# Patient Record
Sex: Female | Born: 1950
Health system: Southern US, Community
[De-identification: ages and names within clinical notes are randomized; demographics above are authoritative.]

## PROBLEM LIST (undated history)

## (undated) DIAGNOSIS — I10 Essential (primary) hypertension: Secondary | ICD-10-CM

## (undated) DIAGNOSIS — I639 Cerebral infarction, unspecified: Secondary | ICD-10-CM

## (undated) DIAGNOSIS — Z8719 Personal history of other diseases of the digestive system: Secondary | ICD-10-CM

## (undated) DIAGNOSIS — F419 Anxiety disorder, unspecified: Secondary | ICD-10-CM

## (undated) DIAGNOSIS — Z9889 Other specified postprocedural states: Secondary | ICD-10-CM

## (undated) DIAGNOSIS — N3289 Other specified disorders of bladder: Secondary | ICD-10-CM

## (undated) DIAGNOSIS — R310 Gross hematuria: Secondary | ICD-10-CM

## (undated) DIAGNOSIS — K219 Gastro-esophageal reflux disease without esophagitis: Secondary | ICD-10-CM

## (undated) DIAGNOSIS — M1990 Unspecified osteoarthritis, unspecified site: Secondary | ICD-10-CM

## (undated) DIAGNOSIS — G4730 Sleep apnea, unspecified: Secondary | ICD-10-CM

## (undated) DIAGNOSIS — F32A Depression, unspecified: Secondary | ICD-10-CM

## (undated) DIAGNOSIS — R112 Nausea with vomiting, unspecified: Secondary | ICD-10-CM

## (undated) DIAGNOSIS — F329 Major depressive disorder, single episode, unspecified: Secondary | ICD-10-CM

## (undated) DIAGNOSIS — R51 Headache: Secondary | ICD-10-CM

## (undated) HISTORY — PX: CHOLECYSTECTOMY: SHX55

## (undated) HISTORY — PX: CARDIAC CATHETERIZATION: SHX172

## (undated) HISTORY — PX: CORONARY ANGIOPLASTY: SHX604

## (undated) HISTORY — PX: ABDOMINAL HYSTERECTOMY: SHX81

---
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
## 2000-10-06 ENCOUNTER — Inpatient Hospital Stay (HOSPITAL_COMMUNITY): Admission: EM | Admit: 2000-10-06 | Discharge: 2000-10-12 | Payer: Self-pay | Admitting: Emergency Medicine

## 2000-10-06 ENCOUNTER — Encounter: Payer: Self-pay | Admitting: Orthopedic Surgery

## 2000-10-06 ENCOUNTER — Encounter: Payer: Self-pay | Admitting: Emergency Medicine

## 2000-10-07 ENCOUNTER — Encounter: Payer: Self-pay | Admitting: Orthopedic Surgery

## 2000-10-12 ENCOUNTER — Inpatient Hospital Stay (HOSPITAL_COMMUNITY)
Admission: RE | Admit: 2000-10-12 | Discharge: 2000-10-18 | Payer: Self-pay | Admitting: Physical Medicine & Rehabilitation

## 2000-11-12 ENCOUNTER — Emergency Department (HOSPITAL_COMMUNITY): Admission: EM | Admit: 2000-11-12 | Discharge: 2000-11-13 | Payer: Self-pay | Admitting: Internal Medicine

## 2001-11-28 ENCOUNTER — Emergency Department (HOSPITAL_COMMUNITY): Admission: EM | Admit: 2001-11-28 | Discharge: 2001-11-28 | Payer: Self-pay | Admitting: Emergency Medicine

## 2002-01-31 ENCOUNTER — Emergency Department (HOSPITAL_COMMUNITY): Admission: EM | Admit: 2002-01-31 | Discharge: 2002-01-31 | Payer: Self-pay | Admitting: Emergency Medicine

## 2002-08-22 ENCOUNTER — Ambulatory Visit (HOSPITAL_COMMUNITY): Admission: RE | Admit: 2002-08-22 | Discharge: 2002-08-22 | Payer: Self-pay | Admitting: General Surgery

## 2002-09-23 ENCOUNTER — Encounter (HOSPITAL_COMMUNITY): Admission: RE | Admit: 2002-09-23 | Discharge: 2002-10-23 | Payer: Self-pay | Admitting: Orthopaedic Surgery

## 2002-09-24 ENCOUNTER — Encounter: Payer: Self-pay | Admitting: General Surgery

## 2013-03-19 ENCOUNTER — Encounter: Payer: Self-pay | Admitting: Cardiology

## 2013-03-19 ENCOUNTER — Encounter (HOSPITAL_COMMUNITY): Payer: Self-pay | Admitting: *Deleted

## 2013-03-19 ENCOUNTER — Inpatient Hospital Stay (HOSPITAL_COMMUNITY)
Admission: AD | Admit: 2013-03-19 | Discharge: 2013-03-21 | DRG: 247 | Disposition: A | Discharge status: Home / Self Care | Payer: Medicare Other | Source: Other Acute Inpatient Hospital | Attending: Cardiology | Admitting: Cardiology

## 2013-03-19 DIAGNOSIS — I2510 Atherosclerotic heart disease of native coronary artery without angina pectoris: Principal | ICD-10-CM | POA: Diagnosis present

## 2013-03-19 DIAGNOSIS — Z955 Presence of coronary angioplasty implant and graft: Secondary | ICD-10-CM

## 2013-03-19 DIAGNOSIS — F329 Major depressive disorder, single episode, unspecified: Secondary | ICD-10-CM | POA: Diagnosis present

## 2013-03-19 DIAGNOSIS — I249 Acute ischemic heart disease, unspecified: Secondary | ICD-10-CM | POA: Diagnosis present

## 2013-03-19 DIAGNOSIS — IMO0002 Reserved for concepts with insufficient information to code with codable children: Secondary | ICD-10-CM | POA: Diagnosis not present

## 2013-03-19 DIAGNOSIS — Y849 Medical procedure, unspecified as the cause of abnormal reaction of the patient, or of later complication, without mention of misadventure at the time of the procedure: Secondary | ICD-10-CM | POA: Diagnosis not present

## 2013-03-19 DIAGNOSIS — S301XXA Contusion of abdominal wall, initial encounter: Secondary | ICD-10-CM | POA: Diagnosis not present

## 2013-03-19 DIAGNOSIS — K279 Peptic ulcer, site unspecified, unspecified as acute or chronic, without hemorrhage or perforation: Secondary | ICD-10-CM | POA: Diagnosis present

## 2013-03-19 DIAGNOSIS — I639 Cerebral infarction, unspecified: Secondary | ICD-10-CM | POA: Diagnosis present

## 2013-03-19 DIAGNOSIS — Z8673 Personal history of transient ischemic attack (TIA), and cerebral infarction without residual deficits: Secondary | ICD-10-CM

## 2013-03-19 DIAGNOSIS — I200 Unstable angina: Secondary | ICD-10-CM | POA: Diagnosis present

## 2013-03-19 DIAGNOSIS — R9431 Abnormal electrocardiogram [ECG] [EKG]: Secondary | ICD-10-CM | POA: Diagnosis present

## 2013-03-19 DIAGNOSIS — F3289 Other specified depressive episodes: Secondary | ICD-10-CM | POA: Diagnosis present

## 2013-03-19 DIAGNOSIS — Z23 Encounter for immunization: Secondary | ICD-10-CM

## 2013-03-19 DIAGNOSIS — F411 Generalized anxiety disorder: Secondary | ICD-10-CM | POA: Diagnosis present

## 2013-03-19 DIAGNOSIS — Z96649 Presence of unspecified artificial hip joint: Secondary | ICD-10-CM

## 2013-03-19 DIAGNOSIS — F17200 Nicotine dependence, unspecified, uncomplicated: Secondary | ICD-10-CM | POA: Diagnosis present

## 2013-03-19 DIAGNOSIS — Z79899 Other long term (current) drug therapy: Secondary | ICD-10-CM

## 2013-03-19 DIAGNOSIS — I214 Non-ST elevation (NSTEMI) myocardial infarction: Secondary | ICD-10-CM

## 2013-03-19 DIAGNOSIS — K219 Gastro-esophageal reflux disease without esophagitis: Secondary | ICD-10-CM | POA: Diagnosis present

## 2013-03-19 DIAGNOSIS — I10 Essential (primary) hypertension: Secondary | ICD-10-CM | POA: Diagnosis present

## 2013-03-19 DIAGNOSIS — Z7982 Long term (current) use of aspirin: Secondary | ICD-10-CM

## 2013-03-19 DIAGNOSIS — N390 Urinary tract infection, site not specified: Secondary | ICD-10-CM | POA: Diagnosis not present

## 2013-03-19 DIAGNOSIS — G4730 Sleep apnea, unspecified: Secondary | ICD-10-CM | POA: Diagnosis present

## 2013-03-19 HISTORY — DX: Headache: R51

## 2013-03-19 HISTORY — DX: Other specified postprocedural states: Z98.890

## 2013-03-19 HISTORY — DX: Sleep apnea, unspecified: G47.30

## 2013-03-19 HISTORY — DX: Essential (primary) hypertension: I10

## 2013-03-19 HISTORY — DX: Personal history of other diseases of the digestive system: Z87.19

## 2013-03-19 HISTORY — DX: Depression, unspecified: F32.A

## 2013-03-19 HISTORY — DX: Other specified postprocedural states: R11.2

## 2013-03-19 HISTORY — DX: Cerebral infarction, unspecified: I63.9

## 2013-03-19 HISTORY — DX: Gastro-esophageal reflux disease without esophagitis: K21.9

## 2013-03-19 HISTORY — DX: Major depressive disorder, single episode, unspecified: F32.9

## 2013-03-19 HISTORY — DX: Unspecified osteoarthritis, unspecified site: M19.90

## 2013-03-19 HISTORY — DX: Anxiety disorder, unspecified: F41.9

## 2013-03-19 MED ORDER — PNEUMOCOCCAL VAC POLYVALENT 25 MCG/0.5ML IJ INJ
0.5000 mL | INJECTION | INTRAMUSCULAR | Status: AC
  Administered 2013-03-21: 0.5 mL via INTRAMUSCULAR
  Filled 2013-03-19 (×2): qty 0.5

## 2013-03-19 MED ORDER — ACETAMINOPHEN 325 MG PO TABS
650.0000 mg | ORAL_TABLET | ORAL | Status: DC | PRN
  Administered 2013-03-20 – 2013-03-21 (×2): 650 mg via ORAL
  Filled 2013-03-19 (×2): qty 2

## 2013-03-19 MED ORDER — NITROGLYCERIN 0.4 MG SL SUBL
0.4000 mg | SUBLINGUAL_TABLET | SUBLINGUAL | Status: DC | PRN
  Administered 2013-03-20: 0.4 mg via SUBLINGUAL

## 2013-03-19 MED ORDER — METOPROLOL TARTRATE 50 MG PO TABS: 50.0000 mg | ORAL_TABLET | Freq: Two times a day (BID) | ORAL | Status: DC

## 2013-03-19 MED ORDER — ONDANSETRON HCL 4 MG/2ML IJ SOLN
4.0000 mg | Freq: Four times a day (QID) | INTRAMUSCULAR | Status: DC | PRN
  Administered 2013-03-20 (×2): 4 mg via INTRAVENOUS
  Filled 2013-03-19 (×2): qty 2

## 2013-03-19 MED ORDER — ALPRAZOLAM 0.5 MG PO TABS
1.0000 mg | ORAL_TABLET | Freq: Three times a day (TID) | ORAL | Status: DC | PRN
  Administered 2013-03-20 – 2013-03-21 (×5): 1 mg via ORAL
  Filled 2013-03-19 (×5): qty 2

## 2013-03-19 MED ORDER — ATORVASTATIN CALCIUM 40 MG PO TABS: 40.0000 mg | ORAL_TABLET | Freq: Every day | ORAL | Status: DC

## 2013-03-19 MED ORDER — ASPIRIN EC 81 MG PO TBEC
81.0000 mg | DELAYED_RELEASE_TABLET | Freq: Every day | ORAL | Status: DC
  Administered 2013-03-20 – 2013-03-21 (×2): 81 mg via ORAL
  Filled 2013-03-19 (×2): qty 1

## 2013-03-19 MED ORDER — ATORVASTATIN CALCIUM 40 MG PO TABS
40.0000 mg | ORAL_TABLET | Freq: Every day | ORAL | Status: DC
  Administered 2013-03-20 (×2): 40 mg via ORAL
  Filled 2013-03-19 (×4): qty 1

## 2013-03-19 MED ORDER — METOPROLOL TARTRATE 25 MG PO TABS
25.0000 mg | ORAL_TABLET | Freq: Two times a day (BID) | ORAL | Status: DC
  Administered 2013-03-20 – 2013-03-21 (×3): 25 mg via ORAL
  Filled 2013-03-19 (×6): qty 1

## 2013-03-19 MED ORDER — TICAGRELOR 90 MG PO TABS
90.0000 mg | ORAL_TABLET | Freq: Two times a day (BID) | ORAL | Status: DC
  Filled 2013-03-19: qty 1
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
## 2013-03-20 ENCOUNTER — Encounter (HOSPITAL_COMMUNITY)
Admission: AD | Disposition: A | Discharge status: Home / Self Care | Payer: Medicare Other | Source: Other Acute Inpatient Hospital | Attending: Cardiology

## 2013-03-20 ENCOUNTER — Ambulatory Visit (HOSPITAL_COMMUNITY): Admit: 2013-03-20 | Payer: Self-pay | Admitting: Cardiovascular Disease

## 2013-03-20 DIAGNOSIS — I2510 Atherosclerotic heart disease of native coronary artery without angina pectoris: Secondary | ICD-10-CM

## 2013-03-20 HISTORY — PX: PERCUTANEOUS CORONARY STENT INTERVENTION (PCI-S): SHX5485

## 2013-03-20 HISTORY — PX: LEFT HEART CATHETERIZATION WITH CORONARY ANGIOGRAM: SHX5451

## 2013-03-20 LAB — TROPONIN I
Troponin I: 0.31 ng/mL (ref <0.30)
Troponin I: <0.3 ng/mL (ref <0.30)

## 2013-03-20 LAB — COMPREHENSIVE METABOLIC PANEL
ALT: 263 U/L — ABNORMAL HIGH (ref 0–35)
AST: 429 U/L — ABNORMAL HIGH (ref 0–37)
Albumin: 3.1 g/dL — ABNORMAL LOW (ref 3.5–5.2)
Alkaline Phosphatase: 105 U/L (ref 39–117)
BILIRUBIN TOTAL: 0.6 mg/dL (ref 0.3–1.2)
BUN: 19 mg/dL (ref 6–23)
CALCIUM: 8.8 mg/dL (ref 8.4–10.5)
CO2: 19 mEq/L (ref 19–32)
Chloride: 109 mEq/L (ref 96–112)
Creatinine, Ser: 0.95 mg/dL (ref 0.50–1.10)
GFR calc non Af Amer: 62 mL/min — ABNORMAL LOW (ref >90)
GFR, EST AFRICAN AMERICAN: 72 mL/min — AB (ref >90)
GLUCOSE: 92 mg/dL (ref 70–99)
Potassium: 4.7 mEq/L (ref 3.7–5.3)
Sodium: 141 mEq/L (ref 137–147)
Total Protein: 5.7 g/dL — ABNORMAL LOW (ref 6.0–8.3)

## 2013-03-20 LAB — CBC WITH DIFFERENTIAL/PLATELET
Basophils Absolute: 0 10*3/uL (ref 0.0–0.1)
Basophils Relative: 0 % (ref 0–1)
EOS PCT: 2 % (ref 0–5)
Eosinophils Absolute: 0.1 10*3/uL (ref 0.0–0.7)
HEMATOCRIT: 39.3 % (ref 36.0–46.0)
HEMOGLOBIN: 13 g/dL (ref 12.0–15.0)
Lymphocytes Relative: 32 % (ref 12–46)
Lymphs Abs: 2.3 10*3/uL (ref 0.7–4.0)
MCH: 32.7 pg (ref 26.0–34.0)
MCHC: 33.1 g/dL (ref 30.0–36.0)
MCV: 98.7 fL (ref 78.0–100.0)
MONOS PCT: 10 % (ref 3–12)
Monocytes Absolute: 0.7 10*3/uL (ref 0.1–1.0)
Neutro Abs: 4.1 10*3/uL (ref 1.7–7.7)
Neutrophils Relative %: 57 % (ref 43–77)
Platelets: 175 10*3/uL (ref 150–400)
RBC: 3.98 MIL/uL (ref 3.87–5.11)
RDW: 14 % (ref 11.5–15.5)
WBC: 7.3 10*3/uL (ref 4.0–10.5)

## 2013-03-20 LAB — LIPID PANEL
Cholesterol: 131 mg/dL (ref 0–200)
HDL: 50 mg/dL (ref >39)
LDL Cholesterol: 62 mg/dL (ref 0–99)
TRIGLYCERIDES: 95 mg/dL (ref <150)
Total CHOL/HDL Ratio: 2.6 RATIO
VLDL: 19 mg/dL (ref 0–40)

## 2013-03-20 LAB — URINALYSIS, ROUTINE W REFLEX MICROSCOPIC
BILIRUBIN URINE: NEGATIVE — AB
Glucose, UA: NEGATIVE mg/dL
Ketones, ur: NEGATIVE mg/dL — AB
NITRITE: POSITIVE — AB
PROTEIN: NEGATIVE mg/dL — AB
Specific Gravity, Urine: 1.014 (ref 1.005–1.030)
UROBILINOGEN UA: 0.2 mg/dL (ref 0.0–1.0)
pH: 5.5 (ref 5.0–8.0)

## 2013-03-20 LAB — HEPARIN LEVEL (UNFRACTIONATED)
HEPARIN UNFRACTIONATED: 0.56 [IU]/mL (ref 0.30–0.70)
Heparin Unfractionated: 0.67 IU/mL (ref 0.30–0.70)

## 2013-03-20 LAB — POCT ACTIVATED CLOTTING TIME: Activated Clotting Time: 381 seconds

## 2013-03-20 LAB — URINE MICROSCOPIC-ADD ON

## 2013-03-20 LAB — PROTIME-INR
INR: 0.92 (ref 0.00–1.49)
Prothrombin Time: 12.2 seconds (ref 11.6–15.2)

## 2013-03-20 LAB — PRO B NATRIURETIC PEPTIDE: Pro B Natriuretic peptide (BNP): 3738 pg/mL — ABNORMAL HIGH (ref 0–125)

## 2013-03-20 LAB — HEMOGLOBIN A1C
Hgb A1c MFr Bld: 5.5 % (ref <5.7)
Mean Plasma Glucose: 111 mg/dL (ref <117)

## 2013-03-20 LAB — MRSA PCR SCREENING: MRSA by PCR: NEGATIVE

## 2013-03-20 SURGERY — LEFT HEART CATHETERIZATION WITH CORONARY ANGIOGRAM
Anesthesia: LOCAL

## 2013-03-20 MED ORDER — HEPARIN (PORCINE) IN NACL 2-0.9 UNIT/ML-% IJ SOLN
INTRAMUSCULAR | Status: AC
  Filled 2013-03-20: qty 1000

## 2013-03-20 MED ORDER — BIVALIRUDIN 250 MG IV SOLR
INTRAVENOUS | Status: AC
  Filled 2013-03-20: qty 250

## 2013-03-20 MED ORDER — MIDAZOLAM HCL 2 MG/2ML IJ SOLN
INTRAMUSCULAR | Status: AC
  Filled 2013-03-20: qty 2

## 2013-03-20 MED ORDER — SODIUM CHLORIDE 0.9 % IV SOLN: 250.0000 mL | INTRAVENOUS | Status: DC | PRN

## 2013-03-20 MED ORDER — SODIUM CHLORIDE 0.9 % IJ SOLN: 3.0000 mL | Freq: Two times a day (BID) | INTRAMUSCULAR | Status: DC

## 2013-03-20 MED ORDER — BUTALBITAL-APAP-CAFFEINE 50-325-40 MG PO TABS
1.0000 | ORAL_TABLET | ORAL | Status: DC | PRN
  Administered 2013-03-20 – 2013-03-21 (×5): 1 via ORAL
  Filled 2013-03-20 (×5): qty 1

## 2013-03-20 MED ORDER — TICAGRELOR 90 MG PO TABS
ORAL_TABLET | ORAL | Status: AC
  Administered 2013-03-20: 90 mg via ORAL
  Filled 2013-03-20: qty 1

## 2013-03-20 MED ORDER — CIPROFLOXACIN HCL 250 MG PO TABS
250.0000 mg | ORAL_TABLET | Freq: Two times a day (BID) | ORAL | Status: DC
  Administered 2013-03-20 – 2013-03-21 (×2): 250 mg via ORAL
  Filled 2013-03-20 (×5): qty 1

## 2013-03-20 MED ORDER — HEPARIN (PORCINE) IN NACL 100-0.45 UNIT/ML-% IJ SOLN
1000.0000 [IU]/h | INTRAMUSCULAR | Status: DC
  Administered 2013-03-19: 1000 [IU]/h via INTRAVENOUS
  Filled 2013-03-20: qty 250

## 2013-03-20 MED ORDER — NITROGLYCERIN IN D5W 200-5 MCG/ML-% IV SOLN
INTRAVENOUS | Status: AC
  Administered 2013-03-20: 10 ug
  Filled 2013-03-20: qty 250

## 2013-03-20 MED ORDER — SODIUM CHLORIDE 0.9 % IJ SOLN: 3.0000 mL | INTRAMUSCULAR | Status: DC | PRN

## 2013-03-20 MED ORDER — SODIUM CHLORIDE 0.9 % IV SOLN
1.0000 mL/kg/h | INTRAVENOUS | Status: AC
  Administered 2013-03-20: 16:00:00 1 mL/kg/h via INTRAVENOUS

## 2013-03-20 MED ORDER — NITROGLYCERIN 0.2 MG/ML ON CALL CATH LAB
INTRAVENOUS | Status: AC
  Filled 2013-03-20: qty 1

## 2013-03-20 MED ORDER — FENTANYL CITRATE 0.05 MG/ML IJ SOLN
INTRAMUSCULAR | Status: AC
  Filled 2013-03-20: qty 2

## 2013-03-20 MED ORDER — TICAGRELOR 90 MG PO TABS
90.0000 mg | ORAL_TABLET | Freq: Two times a day (BID) | ORAL | Status: DC
  Administered 2013-03-20 – 2013-03-21 (×3): 90 mg via ORAL
  Filled 2013-03-20 (×5): qty 1

## 2013-03-20 MED ORDER — SODIUM CHLORIDE 0.9 % IV SOLN
INTRAVENOUS | Status: DC | PRN
  Administered 2013-03-19: 23:00:00 via INTRAVENOUS

## 2013-03-20 MED ORDER — HYDROMORPHONE HCL PF 1 MG/ML IJ SOLN
INTRAMUSCULAR | Status: AC
  Filled 2013-03-20: qty 1

## 2013-03-20 MED ORDER — SODIUM CHLORIDE 0.9 % IV SOLN
250.0000 mL | INTRAVENOUS | Status: DC | PRN
Start: 2013-03-20 — End: 2013-03-20

## 2013-03-20 MED ORDER — LIDOCAINE HCL (PF) 1 % IJ SOLN
INTRAMUSCULAR | Status: AC
  Filled 2013-03-20: qty 30

## 2013-03-20 MED ORDER — TICAGRELOR 90 MG PO TABS
180.0000 mg | ORAL_TABLET | Freq: Once | ORAL | Status: AC
  Administered 2013-03-20: 180 mg via ORAL
  Filled 2013-03-20: qty 2

## 2013-03-20 MED ORDER — PROMETHAZINE HCL 25 MG/ML IJ SOLN
6.2500 mg | Freq: Three times a day (TID) | INTRAMUSCULAR | Status: DC | PRN
  Administered 2013-03-20 (×2): 6.25 mg via INTRAVENOUS
  Filled 2013-03-20 (×2): qty 1

## 2013-03-20 MED ORDER — NITROGLYCERIN IN D5W 200-5 MCG/ML-% IV SOLN: 2.0000 ug/min | INTRAVENOUS | Status: DC

## 2013-03-20 MED ORDER — SODIUM CHLORIDE 0.9 % IV SOLN: INTRAVENOUS | Status: DC

## 2013-03-20 NOTE — Progress Notes (Addendum)
Pt having freq urination with foul smelling urine.  UA was sent + hematuria will send C&S.  Will add antibiotic and also have bladder scan done after urination.    Please note u/a with hematuria was incorrect, lab notified us.  But correct u/a + UTI.

## 2013-03-20 NOTE — Interval H&P Note (Signed)
History and Physical Interval Note:  03/20/2013 10:04 AM  Jaclyn Hart  has presented today for surgery, with the diagnosis of Chest pain  The various methods of treatment have been discussed with the patient and family. After consideration of risks, benefits and other options for treatment, the patient has consented to  Procedure(s): LEFT HEART CATHETERIZATION WITH CORONARY ANGIOGRAM (N/A) as a surgical intervention .  The patient's history has been reviewed, patient examined, no change in status, stable for surgery.  I have reviewed the patient's chart and labs.  Questions were answered to the patient's satisfaction.    Cath Lab Visit (complete for each Cath Lab visit)  Clinical Evaluation Leading to the Procedure:   ACS: yes  Non-ACS:    Anginal Classification: CCS IV  Anti-ischemic medical therapy: Minimal Therapy (1 class of medications)  Non-Invasive Test Results: No non-invasive testing performed  Prior CABG: No previous CABG       Sherren Mocha

## 2013-03-20 NOTE — Progress Notes (Signed)
ANTICOAGULATION CONSULT NOTE - Initial Consult  Pharmacy Consult for heparin Indication: NSTEMI  Allergies  Allergen Reactions  . Aspirin     Has history of ulcers; tries to avoid aspirin products  . Codeine Nausea And Vomiting    Patient Measurements: Height: 5\' 2"  (157.5 cm) Weight: 172 lb 13.5 oz (78.4 kg) IBW/kg (Calculated) : 50.1 Heparin Dosing Weight: 70kg  Vital Signs: Temp: 97.6 F (36.4 C) (01/22 0000) Temp src: Oral (01/22 0000) BP: 101/55 mmHg (01/22 0134) Pulse Rate: 62 (01/22 0134)  Labs:  Recent Labs  03/20/13 0031 03/20/13 0115  HGB  --  13.0  HCT  --  39.3  PLT  --  175  HEPARINUNFRC  --  0.56  CREATININE  --  0.95  TROPONINI 0.31*  --     Estimated Creatinine Clearance: 58.8 ml/min (by C-G formula based on Cr of 0.95).   Medical History: Past Medical History  Diagnosis Date  . PONV (postoperative nausea and vomiting)   . Hypertension   . Anxiety   . Depression   . Sleep apnea   . Headache(784.0)   . Arthritis     osteoarthritis; knees & shoulders  . GERD (gastroesophageal reflux disease)   . Stroke   . H/O hiatal hernia     Medications:  Prescriptions prior to admission  Medication Sig Dispense Refill  . alendronate (FOSAMAX) 70 MG tablet Take 70 mg by mouth once a week. Take with a full glass of water on an empty stomach.      . ALPRAZolam (XANAX) 1 MG tablet Take 1 mg by mouth 3 (three) times daily as needed for anxiety.      Marland Kitchen atorvastatin (LIPITOR) 40 MG tablet Take 40 mg by mouth daily.      . butalbital-acetaminophen-caffeine (FIORICET, ESGIC) 50-325-40 MG per tablet Take 1 tablet by mouth every 8 (eight) hours as needed for headache.      . metoprolol (LOPRESSOR) 50 MG tablet Take 50 mg by mouth 2 (two) times daily.       Scheduled:  . aspirin EC  81 mg Oral Daily  . atorvastatin  40 mg Oral q1800  . metoprolol tartrate  25 mg Oral BID  . pneumococcal 23 valent vaccine  0.5 mL Intramuscular Tomorrow-1000  . Ticagrelor   90 mg Oral BID   Infusions:  . sodium chloride 10 mL/hr at 03/19/13 2300  . heparin 1,000 Units/hr (03/19/13 2300)  . nitroGLYCERIN 5 mcg/min (03/20/13 0130)    Assessment: 63yo female presented to [facility] c/o CP/pressure that developed yesterday am, associated w/ N/V, noted there to have mildly elevated troponin, tx'd to [facility] for further cardiac evaluation.  Goal of Therapy:  Heparin level 0.3-0.7 units/ml Monitor platelets by anticoagulation protocol: Yes   Plan:  Rec'd heparin 4000 units IV bolus followed by gtt at 1000 units/hr at 1400 by OSH; initial heparin level at therapeutic goal; will continue for now and confirm stable with additional level.  Wynona Neat, PharmD, BCPS  03/20/2013,1:55 AM

## 2013-03-20 NOTE — Progress Notes (Signed)
Pt. Not  Coming back, belongings endorsed to cath lab. Staff.

## 2013-03-20 NOTE — Progress Notes (Signed)
CRITICAL VALUE ALERT  Critical value received:  Troponin = .31  Date of notification:  03/20/2013  Time of notification:  0115  Critical value read back:yes  Nurse who received alert:  Suzzette Righter  MD notified (1st page):  Dr Rosalee Kaufman  Time of first page:  0118  MD notified (2nd page):  Time of second page:  Responding MD:  Dr Rosalee Kaufman  Time MD responded:  [phone]

## 2013-03-20 NOTE — Progress Notes (Signed)
I called patient's daughter, [name], and updated her on patient's status.

## 2013-03-20 NOTE — Progress Notes (Signed)
Patient awakened for vital signs and blood draw.  She immediately began complaining of nausea.  Patient states that she takes phenergan for nausea at home.  Ignacia Bayley NP called and updated.  Orders received.

## 2013-03-20 NOTE — Progress Notes (Signed)
To cath lab by bed. Heparin d/ced

## 2013-03-20 NOTE — CV Procedure (Signed)
    Cardiac Catheterization Procedure Note  Name: Jaclyn Hart MRN: [id number] DOB: [birth year]  Procedure: Left Heart Cath, Selective Coronary Angiography, LV angiography,  PTCA/Stent of proximal LAD, Perclose of the RFA  Indication: unstable angina. 63 year-old smoker, hypertensive, presenting with USAP (ongoing chest pain, nausea, borderline enzymes, and EKG changes). She was loaded with Brilinta, anticoagulated with heparin, treated with IV NTG, and referred for cardiac cath and possible PCI.   Diagnostic Procedure Details: The right groin was prepped, draped, and anesthetized with 1% lidocaine. Using the modified Seldinger technique, a 5 French sheath was introduced into the right femoral artery. Standard Judkins catheters were used for selective coronary angiography and left ventriculography. Catheter exchanges were performed over a wire.  The diagnostic procedure was well-tolerated without immediate complications.  PROCEDURAL FINDINGS Hemodynamics: AO 124/73 LV 124/13  Coronary angiography: Coronary dominance: right  Left mainstem: arises from left cusp. Minimal irregularity but no significant stenosis noted  Left anterior descending (LAD): moderately calcified in prox vessel. 95% eccentric proximal LAD stenosis noted with segmental 50% stenosis. The first diag is tiny in caliber. The second diag is moderate in caliber without disease. The mid and distal LAD have mild diffuse nonobstructive disease.  Left circumflex (LCx): large vessel. 20-30% proximal stenosis, 40% mid stenosis, patent OM1 and OM2.  Right coronary artery (RCA): Dominant vessel with diffuse irregularity. PDA and PLA branches are small. No obstructive disease noted.   Left ventriculography: there is mild hypokinesis of the LV apex and distal anterior walls, LVEF is estimated at 55%, there is no significant mitral regurgitation   PCI Procedure Note:  Following the diagnostic procedure, the decision was made to  proceed with PCI. Weight-based bivalirudin was given for anticoagulation. Once a therapeutic ACT was achieved, a 5 [place] EBU guide catheter was inserted.  A cougar coronary guidewire was used to cross the lesion.  The lesion was predilated with a 2.5 mm balloon.  The lesion was then stented with a 2.75x28 mm Promus drug-eluting stent.  The stent was postdilated with a 3.25 mm noncompliant balloon to 16 atm.  Following PCI, there was 0% residual stenosis and TIMI-3 flow. Final angiography confirmed an excellent result. Femoral hemostasis was achieved with a Perclose device.  The patient developed a groin hematoma at the completion of the procedure requiring placement of a Fem-Stop device.She was hemodynamically stable throughout.  The patient was transferred to the post catheterization recovery area for further monitoring.  PCI Data: Vessel - LAD/Segment - proximal Percent Stenosis (pre)  95 TIMI-flow 3 Stent 2.75x28 mm Promus DES Percent Stenosis (post) 0 TIMI-flow (post) 3  Final Conclusions:   1. Severe proximal LAD stenosis, treated successfully with PCI (drug-eluting stent) 2. Nonobstructive LCx and RCA stenosis 3. Mild segmental contraction abnormality of the LV with preserved overall LVEF  Recommendations: DAPT with ASA and brilinta at least 12 months  Sherren Mocha 03/20/2013, 3:17 PM

## 2013-03-21 DIAGNOSIS — I10 Essential (primary) hypertension: Secondary | ICD-10-CM

## 2013-03-21 DIAGNOSIS — Z9861 Coronary angioplasty status: Secondary | ICD-10-CM

## 2013-03-21 DIAGNOSIS — I639 Cerebral infarction, unspecified: Secondary | ICD-10-CM

## 2013-03-21 DIAGNOSIS — Z955 Presence of coronary angioplasty implant and graft: Secondary | ICD-10-CM

## 2013-03-21 DIAGNOSIS — R9431 Abnormal electrocardiogram [ECG] [EKG]: Secondary | ICD-10-CM | POA: Diagnosis present

## 2013-03-21 DIAGNOSIS — S301XXA Contusion of abdominal wall, initial encounter: Secondary | ICD-10-CM | POA: Diagnosis not present

## 2013-03-21 DIAGNOSIS — K219 Gastro-esophageal reflux disease without esophagitis: Secondary | ICD-10-CM | POA: Diagnosis present

## 2013-03-21 DIAGNOSIS — N390 Urinary tract infection, site not specified: Secondary | ICD-10-CM | POA: Diagnosis not present

## 2013-03-21 DIAGNOSIS — I6350 Cerebral infarction due to unspecified occlusion or stenosis of unspecified cerebral artery: Secondary | ICD-10-CM

## 2013-03-21 DIAGNOSIS — I200 Unstable angina: Secondary | ICD-10-CM

## 2013-03-21 LAB — BASIC METABOLIC PANEL
BUN: 18 mg/dL (ref 6–23)
CHLORIDE: 107 meq/L (ref 96–112)
CO2: 19 meq/L (ref 19–32)
CREATININE: 0.94 mg/dL (ref 0.50–1.10)
Calcium: 9.1 mg/dL (ref 8.4–10.5)
GFR calc Af Amer: 73 mL/min — ABNORMAL LOW (ref >90)
GFR calc non Af Amer: 63 mL/min — ABNORMAL LOW (ref >90)
Glucose, Bld: 88 mg/dL (ref 70–99)
POTASSIUM: 5.1 meq/L (ref 3.7–5.3)
Sodium: 139 mEq/L (ref 137–147)

## 2013-03-21 LAB — CBC
HEMATOCRIT: 37.2 % (ref 36.0–46.0)
HEMOGLOBIN: 12.2 g/dL (ref 12.0–15.0)
MCH: 32.5 pg (ref 26.0–34.0)
MCHC: 32.8 g/dL (ref 30.0–36.0)
MCV: 99.2 fL (ref 78.0–100.0)
Platelets: 177 10*3/uL (ref 150–400)
RBC: 3.75 MIL/uL — AB (ref 3.87–5.11)
RDW: 13.7 % (ref 11.5–15.5)
WBC: 6.8 10*3/uL (ref 4.0–10.5)

## 2013-03-21 MED ORDER — PANTOPRAZOLE SODIUM 40 MG PO TBEC
40.0000 mg | DELAYED_RELEASE_TABLET | Freq: Every day | ORAL | Status: DC
  Administered 2013-03-21: 40 mg via ORAL
  Filled 2013-03-21: qty 1

## 2013-03-21 MED ORDER — NITROGLYCERIN 0.4 MG SL SUBL: 0.4000 mg | SUBLINGUAL_TABLET | SUBLINGUAL | Status: DC | PRN

## 2013-03-21 MED ORDER — ACETAMINOPHEN 325 MG PO TABS: 650.0000 mg | ORAL_TABLET | ORAL | Status: DC | PRN

## 2013-03-21 MED ORDER — TICAGRELOR 90 MG PO TABS: 90.0000 mg | ORAL_TABLET | Freq: Two times a day (BID) | ORAL | Status: DC

## 2013-03-21 MED ORDER — CIPROFLOXACIN HCL 250 MG PO TABS: 250.0000 mg | ORAL_TABLET | Freq: Two times a day (BID) | ORAL | Status: DC

## 2013-03-21 MED ORDER — PANTOPRAZOLE SODIUM 40 MG PO TBEC: 40.0000 mg | DELAYED_RELEASE_TABLET | Freq: Every day | ORAL | Status: DC

## 2013-03-21 MED ORDER — METOPROLOL TARTRATE 50 MG PO TABS: 25.0000 mg | ORAL_TABLET | Freq: Two times a day (BID) | ORAL | Status: DC

## 2013-03-21 MED ORDER — ASPIRIN 81 MG PO TBEC: 81.0000 mg | DELAYED_RELEASE_TABLET | Freq: Every day | ORAL | Status: DC

## 2013-03-21 MED FILL — Sodium Chloride IV Soln 0.9%: INTRAVENOUS | Qty: 50 | Status: AC

## 2013-03-21 MED FILL — Heparin Sodium (Porcine) 100 Unt/ML in Sodium Chloride 0.45%: INTRAMUSCULAR | Qty: 250 | Status: AC

## 2013-03-21 NOTE — Care Management Note (Signed)
    Page 1 of 1   03/21/2013     2:57:32 PM   CARE MANAGEMENT NOTE 03/21/2013  Patient:  Jaclyn Hart, Jaclyn Hart   Account Number:  [id number]  Date Initiated:  03/20/2013  Documentation initiated by:  Elissa Hefty  Subjective/Objective Assessment:   adm w mi     Action/Plan:   lives alone, pcp dr Velvet Bathe hasanaj/ benefits check for Brilinta   Anticipated DC Date:  03/21/2013   Anticipated DC Plan:  [place]  CM consult  Medication Assistance      Choice offered to / List presented to:             Status of service:   Medicare Important Message given?   (If response is "NO", the following Medicare IM given date fields will be blank) Date Medicare IM given:   Date Additional Medicare IM given:    Discharge Disposition:    Per UR Regulation:  Reviewed for med. necessity/level of care/duration of stay  If discussed at [place] of Stay Meetings, dates discussed:    Comments:  03/20/13 [place], RN, BSN, [employer] [phone] Spoke with pt at bedside regarding benefits check for Brilinta.  Pt has brochure with 30 day free card and refill assistance card intact.  Pt utilizes [facility] for prescription needs.  NCM called pharmacy to confirm availability of medication.  Information relayed to pt.  Pt verbalizes importance of filling medication upon discharge.  03/20/13 [place], RN, BSN, [employer] [phone] per rep at [facility], tier 3, no auth needed, $45.00 for 30 day supply at retail///  mail order $125 for 90 day supply.  1/22 0941 debbie dowell rn,bsn left 30day free brilinta card in room.

## 2013-03-21 NOTE — Discharge Instructions (Signed)
Coronary Angiography with Stent Coronary angiography with stent placement is a procedure to widen or open a narrow blood vessel of the heart (coronary artery). When a coronary artery becomes partially blocked, it decreases blood flow to that area. This may lead to chest pain or a heart attack (myocardial infarction). Arteries may become blocked by cholesterol buildup (plaque) in the lining or wall.  A stent is a small piece of metal that looks like a mesh or a spring. Stent placement may be done right after a coronary angiography in which a blocked artery is found or as a treatment for a heart attack.  LET YOUR HEALTH CARE PROVIDER KNOW ABOUT:  Any allergies you have.   All medicines you are taking, including vitamins, herbs, eye drops, creams, and over-the-counter medicines.   Previous problems you or members of your family have had with the use of anesthetics.   Any blood disorders you have.   Previous surgeries you have had.   Medical conditions you have. RISKS AND COMPLICATIONS Generally, coronary angiography with stent is a safe procedure. However, as with any procedure, complications can occur. Possible complications include:   Damage to the heart or its blood vessels.   A return of blockage.   Bleeding at the site.   Blood clot in another part of the body.   Kidney injury.   Allergic reaction to the dye or contrast used.  BEFORE THE PROCEDURE  Do not eat or drink anything for 6 hours before the procedure.   Ask your health care provider if medicines can be taken with a sip of water.   Your health care provider will make sure you understand the procedure and the risks and potential complications associated with the procedure.  PROCEDURE  You may be given a medicine to help you relax before and during the procedure (sedative). This medicine will be given through an IV tube that is put into one of your veins.   The area where the catheter will be inserted  is shaved and cleaned. This is usually done in the groin but may be done in the fold of your arm (near your elbow) or in the wrist.   A medicine will be given to numb the area where the catheter will be inserted (local anesthetic).   The catheter is inserted into an artery using a guide wire. A type of X-ray (fluoroscopy) is used to help guide the catheter to the opening of the blocked artery.   A dye is then injected into the catheter, and X-rays are taken. The dye helps to show where any narrowing or blockages are located in the heart arteries.   A tiny wire is guided to the blocked spot, and a balloon is inflated to make the artery wider. The stent is expanded and crushes the plaque into the wall of the vessel. The stent holds the area open like a scaffolding and improves the blood flow.   Sometimes the artery may be made wider using a laser or other tools to remove plaque.   When the blood flow is better, the catheter is removed. The lining of the artery will grow over the stent, which stays where it was placed.  AFTER THE PROCEDURE  If the procedure is done through the leg, you will be kept in bed lying flat for about 6 hours. You will be instructed to not bend or cross your legs.   The insertion site will be checked frequently.   The pulse in your   feet or wrist will be checked frequently.   Additional blood tests, X-rays, and electrocardiography may be done. Document Released: 08/20/2002 Document Revised: 12/04/2012 Document Reviewed: 08/22/2012 Exit[employer] Patient Information 2014 [employer].  
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
## 2013-03-21 NOTE — Discharge Summary (Signed)
Patient ID: Jaclyn Hart,  MRN: [id number], DOB/AGE: 11/08/1950 63 y.o.  Admit date: 03/19/2013 Discharge date: 03/21/2013  Primary Care Provider: Neale Burly, MD  Primary Cardiologist: Dr Harl Bowie (new)  Discharge Diagnoses Principal Problem:   ACS (acute coronary syndrome) Active Problems:   Presence of drug coated stent in Prox LAD - Promus DES 2.75 mm x 28 mm (3.25 mm)   Abnormal EKG- AS TWI   Hematoma of groin - Right; post Cath-PCI.   Hypertension   Lt brain Stroke 2007   GERD/ PUD   UTI (urinary tract infection)- culture pending, home on Cipro    Procedures: Cath/PCI/DES LAD 03/20/13   Hospital Course:  63 y/o from [place] with a history of HTN, smoking, and prior CVA in 2007. She presented to [facility] 03/19/13 with chest pain. Her EKG was abnormal with AS TWI. Her POC Troponin was elevated slightly at 0.31. She was treated as a NSTEMI and transferred to [facility]. Troponin's at [facility] were negative x 3. Cath was done 03/20/13 and revealed 95% LAD stenosis which was intervened on using a DES. Post op she developed a Rt groin hematoma. She was seen by Dr Ellyn Hack the morning of the 23d and felt to be stable for discharge. She was counseled on smoking cessation. She was put on Cipro for positive UA, a culture is pending at discharge and will need to be followed up as an OP. She was also placed on PPI at discharge as she has had ASA GI intolerance in the past.   Discharge Vitals:  Blood pressure 113/65, pulse 69, temperature 98.1 F (36.7 C), temperature source Oral, resp. rate 18, height _0  (1.575 m), weight 176 lb 12.9 oz (80.2 kg), SpO2 95.00%.    Labs: Results for orders placed during the hospital encounter of 03/19/13 (from the past 48 hour(s))  MRSA PCR SCREENING     Status: None   Collection Time    03/19/13 11:17 PM      Result Value Range   MRSA by PCR NEGATIVE  NEGATIVE   Comment:            The GeneXpert MRSA Assay (FDA     approved for NASAL specimens      only), is one component of a     comprehensive MRSA colonization     surveillance program. It is not     intended to diagnose MRSA     infection nor to guide or     monitor treatment for     MRSA infections.  TROPONIN I     Status: Abnormal   Collection Time    03/20/13 12:31 AM      Result Value Range   Troponin I 0.31 (*) <0.30 ng/mL   Comment:            Due to the release kinetics of cTnI,     a negative result within the first hours     of the onset of symptoms does not rule out     myocardial infarction with certainty.     If myocardial infarction is still suspected,     repeat the test at appropriate intervals.     CRITICAL RESULT CALLED TO, READ BACK BY AND VERIFIED WITH:     TURNER G,RN 03/20/13 0115 WAYK  PRO B NATRIURETIC PEPTIDE     Status: Abnormal   Collection Time    03/20/13 12:31 AM      Result Value Range   Pro B  Natriuretic peptide (BNP) 3738.0 (*) 0 - 125 pg/mL  HEMOGLOBIN A1C     Status: None   Collection Time    03/20/13  1:15 AM      Result Value Range   Hemoglobin A1C 5.5  <5.7 %   Comment: (NOTE)                                                                               According to the ADA Clinical Practice Recommendations for 2011, when     HbA1c is used as a screening test:      >=6.5%   Diagnostic of Diabetes Mellitus               (if abnormal result is confirmed)     5.7-6.4%   Increased risk of developing Diabetes Mellitus     References:Diagnosis and Classification of Diabetes Mellitus,Diabetes     JEHU,3149,70(YOVZC 1):S62-S69 and Standards of Medical Care in             Diabetes - 2011,Diabetes Care,2011,34 (Suppl 1):S11-S61.   Mean Plasma Glucose 111  <117 mg/dL   Comment: Performed at [facility]  CBC WITH DIFFERENTIAL     Status: None   Collection Time    03/20/13  1:15 AM      Result Value Range   WBC 7.3  4.0 - 10.5 K/uL   RBC 3.98  3.87 - 5.11 MIL/uL   Hemoglobin 13.0  12.0 - 15.0 g/dL   HCT 39.3  36.0 - 46.0 %    MCV 98.7  78.0 - 100.0 fL   MCH 32.7  26.0 - 34.0 pg   MCHC 33.1  30.0 - 36.0 g/dL   RDW 14.0  11.5 - 15.5 %   Platelets 175  150 - 400 K/uL   Neutrophils Relative % 57  43 - 77 %   Neutro Abs 4.1  1.7 - 7.7 K/uL   Lymphocytes Relative 32  12 - 46 %   Lymphs Abs 2.3  0.7 - 4.0 K/uL   Monocytes Relative 10  3 - 12 %   Monocytes Absolute 0.7  0.1 - 1.0 K/uL   Eosinophils Relative 2  0 - 5 %   Eosinophils Absolute 0.1  0.0 - 0.7 K/uL   Basophils Relative 0  0 - 1 %   Basophils Absolute 0.0  0.0 - 0.1 K/uL  COMPREHENSIVE METABOLIC PANEL     Status: Abnormal   Collection Time    03/20/13  1:15 AM      Result Value Range   Sodium 141  137 - 147 mEq/L   Potassium 4.7  3.7 - 5.3 mEq/L   Chloride 109  96 - 112 mEq/L   CO2 19  19 - 32 mEq/L   Glucose, Bld 92  70 - 99 mg/dL   BUN 19  6 - 23 mg/dL   Creatinine, Ser 0.95  0.50 - 1.10 mg/dL   Calcium 8.8  8.4 - 10.5 mg/dL   Total Protein 5.7 (*) 6.0 - 8.3 g/dL   Albumin 3.1 (*) 3.5 - 5.2 g/dL   AST 429 (*) 0 - 37 U/L   ALT 263 (*) 0 - 35  U/L   Alkaline Phosphatase 105  39 - 117 U/L   Total Bilirubin 0.6  0.3 - 1.2 mg/dL   GFR calc non Af Amer 62 (*) >90 mL/min   GFR calc Af Amer 72 (*) >90 mL/min   Comment: (NOTE)     The eGFR has been calculated using the CKD EPI equation.     This calculation has not been validated in all clinical situations.     eGFR's persistently <90 mL/min signify possible Chronic Kidney     Disease.  HEPARIN LEVEL (UNFRACTIONATED)     Status: None   Collection Time    03/20/13  1:15 AM      Result Value Range   Heparin Unfractionated 0.56  0.30 - 0.70 IU/mL   Comment:            IF HEPARIN RESULTS ARE BELOW     EXPECTED VALUES, AND PATIENT     DOSAGE HAS BEEN CONFIRMED,     SUGGEST FOLLOW UP TESTING     OF ANTITHROMBIN III LEVELS.  LIPID PANEL     Status: None   Collection Time    03/20/13  7:05 AM      Result Value Range   Cholesterol 131  0 - 200 mg/dL   Triglycerides 95  <150 mg/dL   HDL 50  >39  mg/dL   Total CHOL/HDL Ratio 2.6     VLDL 19  0 - 40 mg/dL   LDL Cholesterol 62  0 - 99 mg/dL   Comment:            Total Cholesterol/HDL:CHD Risk     Coronary Heart Disease Risk Table                         Men   Women      1/2 Average Risk   3.4   3.3      Average Risk       5.0   4.4      2 X Average Risk   9.6   7.1      3 X Average Risk  23.4   11.0                Use the calculated Patient Ratio     above and the CHD Risk Table     to determine the patient's CHD Risk.                ATP III CLASSIFICATION (LDL):      <100     mg/dL   Optimal      100-129  mg/dL   Near or Above                        Optimal      130-159  mg/dL   Borderline      160-189  mg/dL   High      >190     mg/dL   Very High  HEPARIN LEVEL (UNFRACTIONATED)     Status: None   Collection Time    03/20/13  7:05 AM      Result Value Range   Heparin Unfractionated 0.67  0.30 - 0.70 IU/mL   Comment:            IF HEPARIN RESULTS ARE BELOW     EXPECTED VALUES, AND PATIENT     DOSAGE HAS BEEN CONFIRMED,     SUGGEST  FOLLOW UP TESTING     OF ANTITHROMBIN III LEVELS.  TROPONIN I     Status: None   Collection Time    03/20/13  7:32 AM      Result Value Range   Troponin I <0.30  <0.30 ng/mL   Comment:            Due to the release kinetics of cTnI,     a negative result within the first hours     of the onset of symptoms does not rule out     myocardial infarction with certainty.     If myocardial infarction is still suspected,     repeat the test at appropriate intervals.  TROPONIN I     Status: None   Collection Time    03/20/13  9:35 AM      Result Value Range   Troponin I <0.30  <0.30 ng/mL   Comment:            Due to the release kinetics of cTnI,     a negative result within the first hours     of the onset of symptoms does not rule out     myocardial infarction with certainty.     If myocardial infarction is still suspected,     repeat the test at appropriate intervals.  PROTIME-INR      Status: None   Collection Time    03/20/13  9:35 AM      Result Value Range   Prothrombin Time 12.2  11.6 - 15.2 seconds   INR 0.92  0.00 - 1.49  POCT ACTIVATED CLOTTING TIME     Status: None   Collection Time    03/20/13 10:35 AM      Result Value Range   Activated Clotting Time 381    URINALYSIS, ROUTINE W REFLEX MICROSCOPIC     Status: Abnormal   Collection Time    03/20/13  3:15 PM      Result Value Range   Color, Urine YELLOW (*) YELLOW   Comment: CALLED TO T HENSHAW,RN 2037 03/20/13 WBOND     CORRECTED ON 01/22 AT 2036: PREVIOUSLY REPORTED AS RED BIOCHEMICALS MAY BE AFFECTED BY COLOR   APPearance CLOUDY (*) CLEAR   Comment: CORRECTED ON 01/22 AT 2036: PREVIOUSLY REPORTED AS TURBID   Specific Gravity, Urine 1.014  1.005 - 1.030   Comment: CORRECTED ON 01/22 AT 2036: PREVIOUSLY REPORTED AS 1.028   pH 5.5  5.0 - 8.0   Comment: CORRECTED ON 01/22 AT 2036: PREVIOUSLY REPORTED AS 6.0   Glucose, UA NEGATIVE  NEGATIVE mg/dL   Hgb urine dipstick LARGE (*) NEGATIVE   Bilirubin Urine NEGATIVE (*) NEGATIVE   Comment: CORRECTED ON 01/22 AT 2036: PREVIOUSLY REPORTED AS SMALL   Ketones, ur NEGATIVE (*) NEGATIVE mg/dL   Comment: CORRECTED ON 01/22 AT 2036: PREVIOUSLY REPORTED AS 15   Protein, ur NEGATIVE (*) NEGATIVE mg/dL   Comment: CORRECTED ON 01/22 AT 2036: PREVIOUSLY REPORTED AS 30   Urobilinogen, UA 0.2  0.0 - 1.0 mg/dL   Nitrite POSITIVE (*) NEGATIVE   Comment: CORRECTED ON 01/22 AT 2036: PREVIOUSLY REPORTED AS NEGATIVE   Leukocytes, UA TRACE (*) NEGATIVE   Comment: CORRECTED ON 01/22 AT 2036: PREVIOUSLY REPORTED AS SMALL  URINE MICROSCOPIC-ADD ON     Status: Abnormal   Collection Time    03/20/13  3:15 PM      Result Value Range   Squamous Epithelial / LPF RARE  RARE  Comment: CORRECTED ON 01/22 AT 2053: PREVIOUSLY REPORTED AS MANY   WBC, UA 3-6  <3 WBC/hpf   Comment: CORRECTED ON 01/22 AT 2053: PREVIOUSLY REPORTED AS 0 2   RBC / HPF 21-50  <3 RBC/hpf   Comment:  CORRECTED ON 01/22 AT 2053: PREVIOUSLY REPORTED AS TOO NUMEROUS TO COUNT   Bacteria, UA MANY (*) RARE   Comment: CORRECTED ON 01/22 AT 2053: PREVIOUSLY REPORTED AS RARE   Urine-Other MUCOUS PRESENT    CBC     Status: Abnormal   Collection Time    03/21/13  6:00 AM      Result Value Range   WBC 6.8  4.0 - 10.5 K/uL   RBC 3.75 (*) 3.87 - 5.11 MIL/uL   Hemoglobin 12.2  12.0 - 15.0 g/dL   HCT 37.2  36.0 - 46.0 %   MCV 99.2  78.0 - 100.0 fL   MCH 32.5  26.0 - 34.0 pg   MCHC 32.8  30.0 - 36.0 g/dL   RDW 13.7  11.5 - 15.5 %   Platelets 177  150 - 400 K/uL  BASIC METABOLIC PANEL     Status: Abnormal   Collection Time    03/21/13  6:00 AM      Result Value Range   Sodium 139  137 - 147 mEq/L   Potassium 5.1  3.7 - 5.3 mEq/L   Chloride 107  96 - 112 mEq/L   CO2 19  19 - 32 mEq/L   Glucose, Bld 88  70 - 99 mg/dL   BUN 18  6 - 23 mg/dL   Creatinine, Ser 0.94  0.50 - 1.10 mg/dL   Calcium 9.1  8.4 - 10.5 mg/dL   GFR calc non Af Amer 63 (*) >90 mL/min   GFR calc Af Amer 73 (*) >90 mL/min   Comment: (NOTE)     The eGFR has been calculated using the CKD EPI equation.     This calculation has not been validated in all clinical situations.     eGFR's persistently <90 mL/min signify possible Chronic Kidney     Disease.    Disposition:  Follow-up Information   Follow up with Arnoldo Lenis, MD On 04/01/2013. (11:40)    Specialty:  Cardiology   Contact information:   [address] [place] [address] [phone]       Discharge Medications:    Medication List         acetaminophen 325 MG tablet  Commonly known as:  TYLENOL  Take 2 tablets (650 mg total) by mouth every 4 (four) hours as needed for headache or mild pain.     alendronate 70 MG tablet  Commonly known as:  FOSAMAX  Take 70 mg by mouth once a week. Take with a full glass of water on an empty stomach.     ALPRAZolam 1 MG tablet  Commonly known as:  XANAX  Take 1 mg by mouth every 8 (eight) hours.      aspirin 81 MG EC tablet  Take 1 tablet (81 mg total) by mouth daily.     atorvastatin 40 MG tablet  Commonly known as:  LIPITOR  Take 40 mg by mouth daily.     butalbital-acetaminophen-caffeine 50-325-40 MG per tablet  Commonly known as:  FIORICET, ESGIC  Take 1 tablet by mouth every 8 (eight) hours.     ciprofloxacin 250 MG tablet  Commonly known as:  CIPRO  Take 1 tablet (250  mg total) by mouth 2 (two) times daily.     metoprolol 50 MG tablet  Commonly known as:  LOPRESSOR  Take 0.5 tablets (25 mg total) by mouth 2 (two) times daily.     nitroGLYCERIN 0.4 MG SL tablet  Commonly known as:  NITROSTAT  Place 1 tablet (0.4 mg total) under the tongue every 5 (five) minutes x 3 doses as needed for chest pain.     pantoprazole 40 MG tablet  Commonly known as:  PROTONIX  Take 1 tablet (40 mg total) by mouth daily at 6 (six) AM.     Ticagrelor 90 MG Tabs tablet  Commonly known as:  BRILINTA  Take 1 tablet (90 mg total) by mouth 2 (two) times daily.         Duration of Discharge Encounter: Greater than 30 minutes including physician time.  Angelena Form PA-C 03/21/2013 9:02 AM  I saw & examined the patient this AM prior to discharge.  She was doing well s/p PCI.  Her groin hematoma is stable & mostly ecchymoses at this point. She is hemodynamically stable on her current medication regimen.  After safely walking this AM, she was ready for discharge.  I agree with the summary note above.  Leonie Man, MD Leonie Man, M.D., M.S. [facility] [address] [place], [place]  [address]  [phone] Pager # [phone] 03/21/2013 10:31 PM

## 2013-03-21 NOTE — Progress Notes (Signed)
   Subjective:  NO Further CP.  Hematoma - sore, but flat & soft Walked once yesterday PM.  Objective:  Vital Signs in the last 24 hours: Temp:  [98 F (36.7 C)-98.7 F (37.1 C)] 98.7 F (37.1 C) (01/23 0400) Pulse Rate:  [56-108] 77 (01/23 0400) Resp:  [15-18] 18 (01/23 0400) BP: (97-148)/(50-103) 114/75 mmHg (01/23 0400) SpO2:  [95 %-100 %] 95 % (01/23 0400) FiO2 (%):  [2 %] 2 % (01/22 0800) Weight:  [176 lb 12.9 oz (80.2 kg)] 176 lb 12.9 oz (80.2 kg) (01/23 0500)  Intake/Output from previous day: 01/22 0701 - 01/23 0700 In: 658.6 [P.O.:220; I.V.:438.6] Out: 2150 [Urine:2150] Intake/Output from this shift:    Physical Exam: General appearance: alert, cooperative, appears stated age, no distress and moderately obese Neck: no adenopathy, no carotid bruit and no JVD Lungs: rhonchi RLL and RML - otherwise CTAB, non-labored. Heart: regular rate and rhythm, S1, S2 normal, no murmur, click, rub or gallop and normal apical impulse Abdomen: soft, non-tender; bowel sounds normal; no masses,  no organomegaly Extremities: extremities normal, atraumatic, no cyanosis or edema and R groin - significant ecchymoses, but hematoma reduced.  Sore, but soft.   Pulses: 2+ and symmetric Neurologic: Grossly normal  Lab Results:  Recent Labs  03/20/13 0115 03/21/13 0600  WBC 7.3 6.8  HGB 13.0 12.2  PLT 175 177    Recent Labs  03/20/13 0115 03/21/13 0600  NA 141 139  K 4.7 5.1  CL 109 107  CO2 19 19  GLUCOSE 92 88  BUN 19 18  CREATININE 0.95 0.94    Recent Labs  03/20/13 0732 03/20/13 0935  TROPONINI <0.30 <0.30   Hepatic Function Panel  Recent Labs  03/20/13 0115  PROT 5.7*  ALBUMIN 3.1*  AST 429*  ALT 263*  ALKPHOS 105  BILITOT 0.6    Recent Labs  03/20/13 0705  CHOL 131    Cardiac Studies: Cath reviewed: 95% prox LAD --> Promus Premier DES 2.75 mm x 28 mm --> 3.25 mm; EF 55%, non-obstructive Dz in LCx, RCA. -> R groin hematoma & FemStop.  Tele - NSR,  PVCs  Assessment/Plan:  Principal Problem:   ACS (acute coronary syndrome) Active Problems:   Stroke   Presence of drug coated stent in Prox LAD - Promus DES 2.75 mm x 28 mm (3.25 mm)   Hematoma of groin - Right; post Cath-PCI.   Hypertension  No further CP post cath.  Ambulated x 1. BP borderline with low dose BB - would not add additional med or titrate up.  On DAPT - need CM to consult for Brilinta assistance. On statin. 5 min of smoking cessation counseling provided.  [place] [facility].  On Cipro - UTI on arrival - complete course 3 days.  Provided BP & HR stable on ambulation this AM - anticipate d/c this AM.  Will need f/u scheduled @ [facility].  Post CATH -PCI precautions discussed.  Monitor for groin concerns.   LOS: 2 days    HARDING,DAVID W 03/21/2013, 7:15 AM

## 2013-03-22 LAB — URINE CULTURE
Colony Count: >=100000
Special Requests: NORMAL

## 2013-04-01 ENCOUNTER — Encounter: Payer: Self-pay | Admitting: Cardiology

## 2013-04-01 ENCOUNTER — Ambulatory Visit (INDEPENDENT_AMBULATORY_CARE_PROVIDER_SITE_OTHER): Payer: Medicare Other | Admitting: Cardiology

## 2013-04-01 VITALS — BP 120/82 | HR 61 | Ht 62.0 in | Wt 169.0 lb

## 2013-04-01 DIAGNOSIS — I10 Essential (primary) hypertension: Secondary | ICD-10-CM

## 2013-04-01 DIAGNOSIS — I2510 Atherosclerotic heart disease of native coronary artery without angina pectoris: Secondary | ICD-10-CM

## 2013-04-01 DIAGNOSIS — F17200 Nicotine dependence, unspecified, uncomplicated: Secondary | ICD-10-CM

## 2013-04-01 DIAGNOSIS — S301XXA Contusion of abdominal wall, initial encounter: Secondary | ICD-10-CM

## 2013-04-01 DIAGNOSIS — Z720 Tobacco use: Secondary | ICD-10-CM

## 2013-04-01 DIAGNOSIS — E785 Hyperlipidemia, unspecified: Secondary | ICD-10-CM

## 2013-04-01 MED ORDER — NICOTINE 14 MG/24HR TD PT24: MEDICATED_PATCH | TRANSDERMAL | Status: DC

## 2013-04-01 MED ORDER — TICAGRELOR 90 MG PO TABS: 90.0000 mg | ORAL_TABLET | Freq: Two times a day (BID) | ORAL | Status: DC

## 2013-04-01 MED ORDER — PANTOPRAZOLE SODIUM 40 MG PO TBEC: 40.0000 mg | DELAYED_RELEASE_TABLET | Freq: Two times a day (BID) | ORAL | Status: DC

## 2013-04-01 MED ORDER — NICOTINE 7 MG/24HR TD PT24: MEDICATED_PATCH | TRANSDERMAL | Status: DC
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
## 2013-04-01 NOTE — Patient Instructions (Signed)
Your physician recommends that you schedule a follow-up appointment in: 4 months with Dr. Harl Bowie. You should receive a letter in the mail in 1-2 months. If you do not receive this letter by March or April 2015 call our office to schedule this appointment.   Your physician has recommended you make the following change in your medication:  Increase: Protonix to 40 MG one tablet by mouth twice daily Start: Nicotine Patch 14 MG place one patch on skin once daily for 6 weeks. Then place 7 MG patch on skin once daily for 2 weeks then stop.  Continue all other medications the same.   You have been given brilanta samples today.

## 2013-04-30 ENCOUNTER — Telehealth: Payer: Self-pay | Admitting: Cardiology

## 2013-04-30 MED ORDER — TICAGRELOR 90 MG PO TABS: 90.0000 mg | ORAL_TABLET | Freq: Two times a day (BID) | ORAL | Status: DC

## 2013-04-30 NOTE — Telephone Encounter (Signed)
Pt called and stated that she received a letter in mail from [employer] requesting a faxed copy of prescription of brilanta. Printed prescription and gave to MD to sign. Faxed prescription to [facility] at [phone]

## 2013-05-08 ENCOUNTER — Other Ambulatory Visit: Payer: Self-pay | Admitting: *Deleted

## 2013-05-08 MED ORDER — TICAGRELOR 90 MG PO TABS: 90.0000 mg | ORAL_TABLET | Freq: Two times a day (BID) | ORAL | Status: DC

## 2013-07-02 ENCOUNTER — Other Ambulatory Visit: Payer: Medicare Other | Admitting: *Deleted

## 2013-07-02 MED ORDER — NITROGLYCERIN 0.4 MG SL SUBL: 0.4000 mg | SUBLINGUAL_TABLET | SUBLINGUAL | Status: DC | PRN

## 2013-08-05 ENCOUNTER — Encounter: Payer: Self-pay | Admitting: Cardiology

## 2013-08-05 ENCOUNTER — Ambulatory Visit (INDEPENDENT_AMBULATORY_CARE_PROVIDER_SITE_OTHER): Payer: Medicare Other | Admitting: Cardiology

## 2013-08-05 VITALS — BP 120/81 | HR 65 | Ht 62.0 in | Wt 172.0 lb

## 2013-08-05 DIAGNOSIS — Z720 Tobacco use: Secondary | ICD-10-CM

## 2013-08-05 DIAGNOSIS — I10 Essential (primary) hypertension: Secondary | ICD-10-CM

## 2013-08-05 DIAGNOSIS — E785 Hyperlipidemia, unspecified: Secondary | ICD-10-CM

## 2013-08-05 DIAGNOSIS — F17200 Nicotine dependence, unspecified, uncomplicated: Secondary | ICD-10-CM

## 2013-08-05 DIAGNOSIS — I2510 Atherosclerotic heart disease of native coronary artery without angina pectoris: Secondary | ICD-10-CM

## 2013-08-05 MED ORDER — AMLODIPINE BESYLATE 2.5 MG PO TABS
2.5000 mg | ORAL_TABLET | Freq: Every day | ORAL | Status: DC
Start: 2013-08-05 — End: 2014-02-18

## 2013-08-05 NOTE — Patient Instructions (Signed)
   Begin Norvasc 2.5mg  daily  Continue all other medications.   Your physician wants you to follow up in:  4 months.  You will receive a reminder letter in the mail one-two months in advance.  If you don't receive a letter, please call our office to schedule the follow up appointment
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
## 2013-10-20 ENCOUNTER — Other Ambulatory Visit: Payer: Self-pay | Admitting: Cardiology

## 2013-12-03 ENCOUNTER — Ambulatory Visit (INDEPENDENT_AMBULATORY_CARE_PROVIDER_SITE_OTHER): Payer: Medicare Other | Admitting: Cardiology

## 2013-12-03 ENCOUNTER — Encounter: Payer: Self-pay | Admitting: Cardiology

## 2013-12-03 VITALS — BP 126/87 | HR 66 | Ht 62.0 in | Wt 173.0 lb

## 2013-12-03 DIAGNOSIS — Z720 Tobacco use: Secondary | ICD-10-CM

## 2013-12-03 DIAGNOSIS — I2510 Atherosclerotic heart disease of native coronary artery without angina pectoris: Secondary | ICD-10-CM

## 2013-12-03 DIAGNOSIS — E785 Hyperlipidemia, unspecified: Secondary | ICD-10-CM

## 2013-12-03 DIAGNOSIS — I10 Essential (primary) hypertension: Secondary | ICD-10-CM

## 2013-12-03 MED ORDER — ATORVASTATIN CALCIUM 80 MG PO TABS: 80.0000 mg | ORAL_TABLET | Freq: Every day | ORAL | Status: DC
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
## 2013-12-03 NOTE — Patient Instructions (Addendum)
   Increase Lipitor to 80mg  daily. New sent to pharmacy. * Until you get your new bottle, you may take 2 of your 40mg  tablets together once a day.*  Stop Brilinta on March 20, 2014 Your physician wants you to follow up in: 6 months.  You will receive a reminder letter in the mail one-two months in advance.  If you don't receive a letter, please call our office to schedule the follow up appointment.

## 2014-02-05 ENCOUNTER — Encounter (HOSPITAL_COMMUNITY): Payer: Self-pay | Admitting: Cardiovascular Disease

## 2014-02-18 ENCOUNTER — Other Ambulatory Visit: Payer: Self-pay | Admitting: Cardiology

## 2014-05-21 ENCOUNTER — Other Ambulatory Visit: Payer: Self-pay | Admitting: Cardiology

## 2014-06-08 ENCOUNTER — Ambulatory Visit: Payer: Self-pay | Admitting: Cardiology

## 2014-06-12 ENCOUNTER — Ambulatory Visit (INDEPENDENT_AMBULATORY_CARE_PROVIDER_SITE_OTHER): Payer: Medicare Other | Admitting: Cardiology

## 2014-06-12 ENCOUNTER — Encounter: Payer: Self-pay | Admitting: Cardiology

## 2014-06-12 VITALS — BP 84/60 | HR 71 | Ht 62.0 in | Wt 163.0 lb

## 2014-06-12 DIAGNOSIS — I10 Essential (primary) hypertension: Secondary | ICD-10-CM | POA: Diagnosis not present

## 2014-06-12 DIAGNOSIS — R079 Chest pain, unspecified: Secondary | ICD-10-CM | POA: Diagnosis not present

## 2014-06-12 DIAGNOSIS — I2510 Atherosclerotic heart disease of native coronary artery without angina pectoris: Secondary | ICD-10-CM

## 2014-06-12 DIAGNOSIS — E785 Hyperlipidemia, unspecified: Secondary | ICD-10-CM

## 2014-06-12 DIAGNOSIS — I249 Acute ischemic heart disease, unspecified: Secondary | ICD-10-CM

## 2014-06-12 MED ORDER — BENAZEPRIL HCL 10 MG PO TABS: 10.0000 mg | ORAL_TABLET | Freq: Every day | ORAL | Status: DC

## 2014-06-12 NOTE — Patient Instructions (Signed)
Your physician wants you to follow-up in: [address] [facility] You will receive a reminder letter in the mail two months in advance. If you don't receive a letter, please call our office to schedule the follow-up appointment.  Your physician has recommended you make the following change in your medication:   DECREASE BENAZEPRIL 10 MG DAILY  CONTINUE ALL OTHER MEDICATIONS AS DIRECTED  Thank you for choosing [facility]!!
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
## 2014-06-22 ENCOUNTER — Other Ambulatory Visit: Payer: Self-pay | Admitting: Cardiology

## 2014-09-21 ENCOUNTER — Other Ambulatory Visit: Payer: Self-pay | Admitting: Cardiology

## 2014-11-20 ENCOUNTER — Other Ambulatory Visit: Payer: Self-pay | Admitting: Cardiology

## 2014-12-10 ENCOUNTER — Encounter: Payer: Self-pay | Admitting: Cardiology

## 2014-12-10 ENCOUNTER — Ambulatory Visit (INDEPENDENT_AMBULATORY_CARE_PROVIDER_SITE_OTHER): Payer: Medicare Other | Admitting: Cardiology

## 2014-12-10 ENCOUNTER — Encounter: Payer: Self-pay | Admitting: *Deleted

## 2014-12-10 VITALS — BP 118/88 | HR 65 | Ht 62.0 in | Wt 157.1 lb

## 2014-12-10 DIAGNOSIS — I10 Essential (primary) hypertension: Secondary | ICD-10-CM | POA: Diagnosis not present

## 2014-12-10 DIAGNOSIS — E785 Hyperlipidemia, unspecified: Secondary | ICD-10-CM

## 2014-12-10 DIAGNOSIS — I2510 Atherosclerotic heart disease of native coronary artery without angina pectoris: Secondary | ICD-10-CM | POA: Diagnosis not present

## 2014-12-10 MED ORDER — NITROGLYCERIN 0.4 MG SL SUBL: 0.4000 mg | SUBLINGUAL_TABLET | SUBLINGUAL | Status: DC | PRN

## 2014-12-10 NOTE — Patient Instructions (Signed)
Your physician wants you to follow-up in: [place] DR. BRANCH You will receive a reminder letter in the mail two months in advance. If you don't receive a letter, please call our office to schedule the follow-up appointment.  Your physician recommends that you continue on your current medications as directed. Please refer to the Current Medication list given to you today.  WE WILL REQUEST LAB RESULTS  Thank you for choosing [facility]!!
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
## 2014-12-16 ENCOUNTER — Telehealth: Payer: Self-pay | Admitting: Cardiology

## 2014-12-16 NOTE — Telephone Encounter (Signed)
Patient calls stating that she is having more issues with acid reflux.  Wants to know if she can increase her medication for the reflux.

## 2014-12-16 NOTE — Telephone Encounter (Signed)
Contacted patient about her acid reflux problem. Patient denied eating foods that were spicy and acidic. Patient confirmed that she is taking protonix 40 mg twice daily along with zantac 300 mg daily for acid reflux and ulcers. Patient denied having chest pain, dizziness or sob. Patient advised that Dr. Harl Bowie was out of the office and that she should contact her PCP about her acid reflux problem. Patient verbalized understanding of plan.

## 2015-02-15 ENCOUNTER — Other Ambulatory Visit: Payer: Self-pay | Admitting: Cardiology

## 2015-03-05 DIAGNOSIS — M797 Fibromyalgia: Secondary | ICD-10-CM | POA: Diagnosis not present

## 2015-03-05 DIAGNOSIS — N182 Chronic kidney disease, stage 2 (mild): Secondary | ICD-10-CM | POA: Diagnosis not present

## 2015-03-05 DIAGNOSIS — I10 Essential (primary) hypertension: Secondary | ICD-10-CM | POA: Diagnosis not present

## 2015-03-05 DIAGNOSIS — E784 Other hyperlipidemia: Secondary | ICD-10-CM | POA: Diagnosis not present

## 2015-03-05 DIAGNOSIS — Z6827 Body mass index (BMI) 27.0-27.9, adult: Secondary | ICD-10-CM | POA: Diagnosis not present

## 2015-03-05 DIAGNOSIS — Z0000 Encounter for general adult medical examination without abnormal findings: Secondary | ICD-10-CM | POA: Diagnosis not present

## 2015-04-09 DIAGNOSIS — M172 Bilateral post-traumatic osteoarthritis of knee: Secondary | ICD-10-CM | POA: Diagnosis not present

## 2015-04-15 ENCOUNTER — Other Ambulatory Visit: Payer: Self-pay | Admitting: Cardiology

## 2015-04-23 ENCOUNTER — Other Ambulatory Visit: Payer: Self-pay | Admitting: Cardiology

## 2015-05-04 DIAGNOSIS — N182 Chronic kidney disease, stage 2 (mild): Secondary | ICD-10-CM | POA: Diagnosis not present

## 2015-05-04 DIAGNOSIS — I10 Essential (primary) hypertension: Secondary | ICD-10-CM | POA: Diagnosis not present

## 2015-05-04 DIAGNOSIS — M12862 Other specific arthropathies, not elsewhere classified, left knee: Secondary | ICD-10-CM | POA: Diagnosis not present

## 2015-05-04 DIAGNOSIS — M797 Fibromyalgia: Secondary | ICD-10-CM | POA: Diagnosis not present

## 2015-05-04 DIAGNOSIS — E784 Other hyperlipidemia: Secondary | ICD-10-CM | POA: Diagnosis not present

## 2015-05-17 ENCOUNTER — Other Ambulatory Visit: Payer: Self-pay | Admitting: Cardiology

## 2015-07-28 DIAGNOSIS — M179 Osteoarthritis of knee, unspecified: Secondary | ICD-10-CM | POA: Diagnosis not present

## 2015-07-28 DIAGNOSIS — I10 Essential (primary) hypertension: Secondary | ICD-10-CM | POA: Diagnosis not present

## 2015-07-28 DIAGNOSIS — M1990 Unspecified osteoarthritis, unspecified site: Secondary | ICD-10-CM | POA: Diagnosis not present

## 2015-07-28 DIAGNOSIS — Z96649 Presence of unspecified artificial hip joint: Secondary | ICD-10-CM | POA: Diagnosis not present

## 2015-07-28 DIAGNOSIS — K219 Gastro-esophageal reflux disease without esophagitis: Secondary | ICD-10-CM | POA: Diagnosis not present

## 2015-07-28 DIAGNOSIS — Z79899 Other long term (current) drug therapy: Secondary | ICD-10-CM | POA: Diagnosis not present

## 2015-08-06 DIAGNOSIS — M797 Fibromyalgia: Secondary | ICD-10-CM | POA: Diagnosis not present

## 2015-08-06 DIAGNOSIS — I10 Essential (primary) hypertension: Secondary | ICD-10-CM | POA: Diagnosis not present

## 2015-08-06 DIAGNOSIS — E784 Other hyperlipidemia: Secondary | ICD-10-CM | POA: Diagnosis not present

## 2015-08-06 DIAGNOSIS — N182 Chronic kidney disease, stage 2 (mild): Secondary | ICD-10-CM | POA: Diagnosis not present

## 2015-08-06 DIAGNOSIS — M12862 Other specific arthropathies, not elsewhere classified, left knee: Secondary | ICD-10-CM | POA: Diagnosis not present

## 2015-08-13 ENCOUNTER — Other Ambulatory Visit: Payer: Self-pay | Admitting: Cardiology

## 2015-10-29 DIAGNOSIS — N182 Chronic kidney disease, stage 2 (mild): Secondary | ICD-10-CM | POA: Diagnosis not present

## 2015-10-29 DIAGNOSIS — M797 Fibromyalgia: Secondary | ICD-10-CM | POA: Diagnosis not present

## 2015-10-29 DIAGNOSIS — I10 Essential (primary) hypertension: Secondary | ICD-10-CM | POA: Diagnosis not present

## 2015-10-29 DIAGNOSIS — E784 Other hyperlipidemia: Secondary | ICD-10-CM | POA: Diagnosis not present

## 2015-11-05 ENCOUNTER — Other Ambulatory Visit: Payer: Self-pay | Admitting: Cardiology

## 2015-11-08 DIAGNOSIS — E784 Other hyperlipidemia: Secondary | ICD-10-CM | POA: Diagnosis not present

## 2015-11-08 DIAGNOSIS — M12862 Other specific arthropathies, not elsewhere classified, left knee: Secondary | ICD-10-CM | POA: Diagnosis not present

## 2015-11-08 DIAGNOSIS — N182 Chronic kidney disease, stage 2 (mild): Secondary | ICD-10-CM | POA: Diagnosis not present

## 2015-11-08 DIAGNOSIS — Z1389 Encounter for screening for other disorder: Secondary | ICD-10-CM | POA: Diagnosis not present

## 2015-11-08 DIAGNOSIS — M797 Fibromyalgia: Secondary | ICD-10-CM | POA: Diagnosis not present

## 2015-11-08 DIAGNOSIS — I10 Essential (primary) hypertension: Secondary | ICD-10-CM | POA: Diagnosis not present

## 2015-11-08 DIAGNOSIS — Z0000 Encounter for general adult medical examination without abnormal findings: Secondary | ICD-10-CM | POA: Diagnosis not present

## 2015-11-22 DIAGNOSIS — H538 Other visual disturbances: Secondary | ICD-10-CM | POA: Diagnosis not present

## 2015-11-22 DIAGNOSIS — H2513 Age-related nuclear cataract, bilateral: Secondary | ICD-10-CM | POA: Diagnosis not present

## 2015-11-29 DIAGNOSIS — N182 Chronic kidney disease, stage 2 (mild): Secondary | ICD-10-CM | POA: Diagnosis not present

## 2015-11-29 DIAGNOSIS — M797 Fibromyalgia: Secondary | ICD-10-CM | POA: Diagnosis not present

## 2015-11-29 DIAGNOSIS — I10 Essential (primary) hypertension: Secondary | ICD-10-CM | POA: Diagnosis not present

## 2015-11-29 DIAGNOSIS — E784 Other hyperlipidemia: Secondary | ICD-10-CM | POA: Diagnosis not present

## 2015-12-15 ENCOUNTER — Encounter: Payer: Self-pay | Admitting: *Deleted

## 2015-12-16 ENCOUNTER — Ambulatory Visit: Payer: Medicare Other | Admitting: Cardiology

## 2016-01-17 ENCOUNTER — Encounter: Payer: Self-pay | Admitting: Cardiology

## 2016-01-17 ENCOUNTER — Encounter: Payer: Self-pay | Admitting: *Deleted

## 2016-01-17 ENCOUNTER — Ambulatory Visit (INDEPENDENT_AMBULATORY_CARE_PROVIDER_SITE_OTHER): Payer: Medicare Other | Admitting: Cardiology

## 2016-01-17 VITALS — BP 129/85 | HR 71 | Ht 63.0 in | Wt 147.0 lb

## 2016-01-17 DIAGNOSIS — I10 Essential (primary) hypertension: Secondary | ICD-10-CM

## 2016-01-17 DIAGNOSIS — R079 Chest pain, unspecified: Secondary | ICD-10-CM | POA: Diagnosis not present

## 2016-01-17 DIAGNOSIS — E782 Mixed hyperlipidemia: Secondary | ICD-10-CM | POA: Diagnosis not present

## 2016-01-17 DIAGNOSIS — I2510 Atherosclerotic heart disease of native coronary artery without angina pectoris: Secondary | ICD-10-CM
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
## 2016-01-17 NOTE — Patient Instructions (Signed)
Your physician recommends that you schedule a follow-up appointment in: TO BE DETERMINED AFTER TESTING   Your physician recommends that you continue on your current medications as directed. Please refer to the Current Medication list given to you today.  Your physician has requested that you have a lexiscan myoview. For further information please visit [email or web address]. Please follow instruction sheet, as given.  Thank you for choosing  [facility]!!    

## 2016-01-26 DIAGNOSIS — H25012 Cortical age-related cataract, left eye: Secondary | ICD-10-CM | POA: Diagnosis not present

## 2016-01-26 DIAGNOSIS — H2513 Age-related nuclear cataract, bilateral: Secondary | ICD-10-CM | POA: Diagnosis not present

## 2016-01-26 DIAGNOSIS — H25013 Cortical age-related cataract, bilateral: Secondary | ICD-10-CM | POA: Diagnosis not present

## 2016-01-26 DIAGNOSIS — H2512 Age-related nuclear cataract, left eye: Secondary | ICD-10-CM | POA: Diagnosis not present

## 2016-01-28 ENCOUNTER — Other Ambulatory Visit: Payer: Self-pay | Admitting: Cardiology

## 2016-02-01 ENCOUNTER — Encounter (HOSPITAL_COMMUNITY): Payer: Medicare Other

## 2016-02-02 ENCOUNTER — Encounter: Payer: Self-pay | Admitting: *Deleted

## 2016-02-07 DIAGNOSIS — M797 Fibromyalgia: Secondary | ICD-10-CM | POA: Diagnosis not present

## 2016-02-07 DIAGNOSIS — I10 Essential (primary) hypertension: Secondary | ICD-10-CM | POA: Diagnosis not present

## 2016-02-07 DIAGNOSIS — N182 Chronic kidney disease, stage 2 (mild): Secondary | ICD-10-CM | POA: Diagnosis not present

## 2016-02-07 DIAGNOSIS — E784 Other hyperlipidemia: Secondary | ICD-10-CM | POA: Diagnosis not present

## 2016-02-10 ENCOUNTER — Encounter: Payer: Self-pay | Admitting: *Deleted

## 2016-02-15 DIAGNOSIS — H2512 Age-related nuclear cataract, left eye: Secondary | ICD-10-CM | POA: Diagnosis not present

## 2016-02-15 DIAGNOSIS — H25812 Combined forms of age-related cataract, left eye: Secondary | ICD-10-CM | POA: Diagnosis not present

## 2016-02-15 DIAGNOSIS — H25012 Cortical age-related cataract, left eye: Secondary | ICD-10-CM | POA: Diagnosis not present

## 2016-02-17 DIAGNOSIS — N182 Chronic kidney disease, stage 2 (mild): Secondary | ICD-10-CM | POA: Diagnosis not present

## 2016-02-17 DIAGNOSIS — E784 Other hyperlipidemia: Secondary | ICD-10-CM | POA: Diagnosis not present

## 2016-02-17 DIAGNOSIS — I10 Essential (primary) hypertension: Secondary | ICD-10-CM | POA: Diagnosis not present

## 2016-02-17 DIAGNOSIS — M797 Fibromyalgia: Secondary | ICD-10-CM | POA: Diagnosis not present

## 2016-02-28 ENCOUNTER — Other Ambulatory Visit: Payer: Self-pay | Admitting: Cardiology

## 2016-03-08 DIAGNOSIS — H2511 Age-related nuclear cataract, right eye: Secondary | ICD-10-CM | POA: Diagnosis not present

## 2016-03-08 DIAGNOSIS — H25041 Posterior subcapsular polar age-related cataract, right eye: Secondary | ICD-10-CM | POA: Diagnosis not present

## 2016-03-08 DIAGNOSIS — H25011 Cortical age-related cataract, right eye: Secondary | ICD-10-CM | POA: Diagnosis not present

## 2016-03-13 DIAGNOSIS — E784 Other hyperlipidemia: Secondary | ICD-10-CM | POA: Diagnosis not present

## 2016-03-13 DIAGNOSIS — N182 Chronic kidney disease, stage 2 (mild): Secondary | ICD-10-CM | POA: Diagnosis not present

## 2016-03-13 DIAGNOSIS — M797 Fibromyalgia: Secondary | ICD-10-CM | POA: Diagnosis not present

## 2016-03-13 DIAGNOSIS — I10 Essential (primary) hypertension: Secondary | ICD-10-CM | POA: Diagnosis not present

## 2016-03-14 DIAGNOSIS — H2511 Age-related nuclear cataract, right eye: Secondary | ICD-10-CM | POA: Diagnosis not present

## 2016-03-14 DIAGNOSIS — H25011 Cortical age-related cataract, right eye: Secondary | ICD-10-CM | POA: Diagnosis not present

## 2016-03-14 DIAGNOSIS — H25041 Posterior subcapsular polar age-related cataract, right eye: Secondary | ICD-10-CM | POA: Diagnosis not present

## 2016-04-07 DIAGNOSIS — M797 Fibromyalgia: Secondary | ICD-10-CM | POA: Diagnosis not present

## 2016-04-07 DIAGNOSIS — I10 Essential (primary) hypertension: Secondary | ICD-10-CM | POA: Diagnosis not present

## 2016-04-07 DIAGNOSIS — N182 Chronic kidney disease, stage 2 (mild): Secondary | ICD-10-CM | POA: Diagnosis not present

## 2016-04-07 DIAGNOSIS — E784 Other hyperlipidemia: Secondary | ICD-10-CM | POA: Diagnosis not present

## 2016-04-29 ENCOUNTER — Other Ambulatory Visit: Payer: Self-pay | Admitting: Cardiology

## 2016-05-01 ENCOUNTER — Other Ambulatory Visit: Payer: Self-pay | Admitting: *Deleted

## 2016-05-01 MED ORDER — BENAZEPRIL HCL 10 MG PO TABS: 10.0000 mg | ORAL_TABLET | Freq: Every day | ORAL | 0 refills | Status: DC

## 2016-05-09 DIAGNOSIS — I10 Essential (primary) hypertension: Secondary | ICD-10-CM | POA: Diagnosis not present

## 2016-05-09 DIAGNOSIS — M797 Fibromyalgia: Secondary | ICD-10-CM | POA: Diagnosis not present

## 2016-05-09 DIAGNOSIS — Z1389 Encounter for screening for other disorder: Secondary | ICD-10-CM | POA: Diagnosis not present

## 2016-05-09 DIAGNOSIS — Z0000 Encounter for general adult medical examination without abnormal findings: Secondary | ICD-10-CM | POA: Diagnosis not present

## 2016-05-09 DIAGNOSIS — Z6824 Body mass index (BMI) 24.0-24.9, adult: Secondary | ICD-10-CM | POA: Diagnosis not present

## 2016-05-09 DIAGNOSIS — E784 Other hyperlipidemia: Secondary | ICD-10-CM | POA: Diagnosis not present

## 2016-05-09 DIAGNOSIS — N182 Chronic kidney disease, stage 2 (mild): Secondary | ICD-10-CM | POA: Diagnosis not present

## 2016-05-29 ENCOUNTER — Other Ambulatory Visit: Payer: Self-pay | Admitting: Cardiology

## 2016-06-22 DIAGNOSIS — E784 Other hyperlipidemia: Secondary | ICD-10-CM | POA: Diagnosis not present

## 2016-06-22 DIAGNOSIS — M797 Fibromyalgia: Secondary | ICD-10-CM | POA: Diagnosis not present

## 2016-06-22 DIAGNOSIS — N182 Chronic kidney disease, stage 2 (mild): Secondary | ICD-10-CM | POA: Diagnosis not present

## 2016-06-22 DIAGNOSIS — I10 Essential (primary) hypertension: Secondary | ICD-10-CM | POA: Diagnosis not present

## 2016-06-29 ENCOUNTER — Other Ambulatory Visit: Payer: Self-pay | Admitting: Cardiology

## 2016-07-10 DIAGNOSIS — N182 Chronic kidney disease, stage 2 (mild): Secondary | ICD-10-CM | POA: Diagnosis not present

## 2016-07-10 DIAGNOSIS — I10 Essential (primary) hypertension: Secondary | ICD-10-CM | POA: Diagnosis not present

## 2016-07-10 DIAGNOSIS — M797 Fibromyalgia: Secondary | ICD-10-CM | POA: Diagnosis not present

## 2016-07-10 DIAGNOSIS — E784 Other hyperlipidemia: Secondary | ICD-10-CM | POA: Diagnosis not present

## 2016-07-10 DIAGNOSIS — M25562 Pain in left knee: Secondary | ICD-10-CM | POA: Diagnosis not present

## 2016-07-19 DIAGNOSIS — E784 Other hyperlipidemia: Secondary | ICD-10-CM | POA: Diagnosis not present

## 2016-07-19 DIAGNOSIS — M797 Fibromyalgia: Secondary | ICD-10-CM | POA: Diagnosis not present

## 2016-07-19 DIAGNOSIS — N182 Chronic kidney disease, stage 2 (mild): Secondary | ICD-10-CM | POA: Diagnosis not present

## 2016-07-19 DIAGNOSIS — I10 Essential (primary) hypertension: Secondary | ICD-10-CM | POA: Diagnosis not present

## 2016-08-08 DIAGNOSIS — E784 Other hyperlipidemia: Secondary | ICD-10-CM | POA: Diagnosis not present

## 2016-08-08 DIAGNOSIS — N182 Chronic kidney disease, stage 2 (mild): Secondary | ICD-10-CM | POA: Diagnosis not present

## 2016-08-08 DIAGNOSIS — I10 Essential (primary) hypertension: Secondary | ICD-10-CM | POA: Diagnosis not present

## 2016-08-08 DIAGNOSIS — M797 Fibromyalgia: Secondary | ICD-10-CM | POA: Diagnosis not present

## 2016-08-14 DIAGNOSIS — H26493 Other secondary cataract, bilateral: Secondary | ICD-10-CM | POA: Diagnosis not present

## 2016-08-14 DIAGNOSIS — H40013 Open angle with borderline findings, low risk, bilateral: Secondary | ICD-10-CM | POA: Diagnosis not present

## 2016-08-22 ENCOUNTER — Other Ambulatory Visit: Payer: Self-pay | Admitting: Cardiology

## 2016-09-12 DIAGNOSIS — M25562 Pain in left knee: Secondary | ICD-10-CM | POA: Diagnosis not present

## 2016-09-12 DIAGNOSIS — M1712 Unilateral primary osteoarthritis, left knee: Secondary | ICD-10-CM | POA: Diagnosis not present

## 2016-09-12 DIAGNOSIS — Z955 Presence of coronary angioplasty implant and graft: Secondary | ICD-10-CM | POA: Diagnosis not present

## 2016-09-12 DIAGNOSIS — G8929 Other chronic pain: Secondary | ICD-10-CM | POA: Diagnosis not present

## 2016-09-15 DIAGNOSIS — M797 Fibromyalgia: Secondary | ICD-10-CM | POA: Diagnosis not present

## 2016-09-15 DIAGNOSIS — E784 Other hyperlipidemia: Secondary | ICD-10-CM | POA: Diagnosis not present

## 2016-09-15 DIAGNOSIS — M25562 Pain in left knee: Secondary | ICD-10-CM | POA: Diagnosis not present

## 2016-09-15 DIAGNOSIS — N182 Chronic kidney disease, stage 2 (mild): Secondary | ICD-10-CM | POA: Diagnosis not present

## 2016-09-15 DIAGNOSIS — I10 Essential (primary) hypertension: Secondary | ICD-10-CM | POA: Diagnosis not present

## 2016-09-26 ENCOUNTER — Other Ambulatory Visit: Payer: Self-pay | Admitting: Cardiology

## 2016-10-16 ENCOUNTER — Other Ambulatory Visit: Payer: Self-pay | Admitting: Cardiology

## 2016-10-17 DIAGNOSIS — R52 Pain, unspecified: Secondary | ICD-10-CM | POA: Diagnosis not present

## 2016-10-17 DIAGNOSIS — K219 Gastro-esophageal reflux disease without esophagitis: Secondary | ICD-10-CM | POA: Diagnosis not present

## 2016-10-17 DIAGNOSIS — R42 Dizziness and giddiness: Secondary | ICD-10-CM | POA: Diagnosis not present

## 2016-10-17 DIAGNOSIS — H6690 Otitis media, unspecified, unspecified ear: Secondary | ICD-10-CM | POA: Diagnosis not present

## 2016-10-17 DIAGNOSIS — I10 Essential (primary) hypertension: Secondary | ICD-10-CM | POA: Diagnosis not present

## 2016-10-17 DIAGNOSIS — H6692 Otitis media, unspecified, left ear: Secondary | ICD-10-CM | POA: Diagnosis not present

## 2016-10-17 DIAGNOSIS — Z79899 Other long term (current) drug therapy: Secondary | ICD-10-CM | POA: Diagnosis not present

## 2016-10-17 DIAGNOSIS — R509 Fever, unspecified: Secondary | ICD-10-CM | POA: Diagnosis not present

## 2016-10-17 DIAGNOSIS — R51 Headache: Secondary | ICD-10-CM | POA: Diagnosis not present

## 2016-10-17 DIAGNOSIS — H9209 Otalgia, unspecified ear: Secondary | ICD-10-CM | POA: Diagnosis not present

## 2016-11-01 DIAGNOSIS — M797 Fibromyalgia: Secondary | ICD-10-CM | POA: Diagnosis not present

## 2016-11-01 DIAGNOSIS — N182 Chronic kidney disease, stage 2 (mild): Secondary | ICD-10-CM | POA: Diagnosis not present

## 2016-11-01 DIAGNOSIS — E784 Other hyperlipidemia: Secondary | ICD-10-CM | POA: Diagnosis not present

## 2016-11-01 DIAGNOSIS — I10 Essential (primary) hypertension: Secondary | ICD-10-CM | POA: Diagnosis not present

## 2016-11-07 DIAGNOSIS — N182 Chronic kidney disease, stage 2 (mild): Secondary | ICD-10-CM | POA: Diagnosis not present

## 2016-11-07 DIAGNOSIS — I10 Essential (primary) hypertension: Secondary | ICD-10-CM | POA: Diagnosis not present

## 2016-11-07 DIAGNOSIS — M25562 Pain in left knee: Secondary | ICD-10-CM | POA: Diagnosis not present

## 2016-11-07 DIAGNOSIS — E784 Other hyperlipidemia: Secondary | ICD-10-CM | POA: Diagnosis not present

## 2016-11-07 DIAGNOSIS — M797 Fibromyalgia: Secondary | ICD-10-CM | POA: Diagnosis not present

## 2016-11-12 DIAGNOSIS — I10 Essential (primary) hypertension: Secondary | ICD-10-CM | POA: Diagnosis not present

## 2016-11-12 DIAGNOSIS — M810 Age-related osteoporosis without current pathological fracture: Secondary | ICD-10-CM | POA: Diagnosis not present

## 2016-11-12 DIAGNOSIS — M19012 Primary osteoarthritis, left shoulder: Secondary | ICD-10-CM | POA: Diagnosis not present

## 2016-11-12 DIAGNOSIS — M25512 Pain in left shoulder: Secondary | ICD-10-CM | POA: Diagnosis not present

## 2016-11-12 DIAGNOSIS — K219 Gastro-esophageal reflux disease without esophagitis: Secondary | ICD-10-CM | POA: Diagnosis not present

## 2016-11-12 DIAGNOSIS — Z79899 Other long term (current) drug therapy: Secondary | ICD-10-CM | POA: Diagnosis not present

## 2016-11-13 DIAGNOSIS — M25512 Pain in left shoulder: Secondary | ICD-10-CM | POA: Diagnosis not present

## 2016-11-27 ENCOUNTER — Other Ambulatory Visit: Payer: Self-pay | Admitting: Cardiology

## 2016-12-17 ENCOUNTER — Other Ambulatory Visit: Payer: Self-pay | Admitting: Cardiology

## 2016-12-25 DIAGNOSIS — E7849 Other hyperlipidemia: Secondary | ICD-10-CM | POA: Diagnosis not present

## 2016-12-25 DIAGNOSIS — I10 Essential (primary) hypertension: Secondary | ICD-10-CM | POA: Diagnosis not present

## 2016-12-25 DIAGNOSIS — N182 Chronic kidney disease, stage 2 (mild): Secondary | ICD-10-CM | POA: Diagnosis not present

## 2016-12-26 DIAGNOSIS — M170 Bilateral primary osteoarthritis of knee: Secondary | ICD-10-CM | POA: Diagnosis not present

## 2016-12-26 DIAGNOSIS — M1712 Unilateral primary osteoarthritis, left knee: Secondary | ICD-10-CM | POA: Diagnosis not present

## 2016-12-26 DIAGNOSIS — S72301S Unspecified fracture of shaft of right femur, sequela: Secondary | ICD-10-CM | POA: Diagnosis not present

## 2016-12-27 ENCOUNTER — Other Ambulatory Visit: Payer: Self-pay | Admitting: Cardiology

## 2017-01-08 DIAGNOSIS — I10 Essential (primary) hypertension: Secondary | ICD-10-CM | POA: Diagnosis not present

## 2017-01-08 DIAGNOSIS — E7849 Other hyperlipidemia: Secondary | ICD-10-CM | POA: Diagnosis not present

## 2017-01-08 DIAGNOSIS — N182 Chronic kidney disease, stage 2 (mild): Secondary | ICD-10-CM | POA: Diagnosis not present

## 2017-01-08 DIAGNOSIS — M797 Fibromyalgia: Secondary | ICD-10-CM | POA: Diagnosis not present

## 2017-01-09 ENCOUNTER — Encounter: Payer: Self-pay | Admitting: *Deleted

## 2017-01-09 ENCOUNTER — Encounter: Payer: Self-pay | Admitting: Cardiology

## 2017-01-09 ENCOUNTER — Ambulatory Visit: Payer: Medicare Other | Admitting: Cardiology

## 2017-01-09 ENCOUNTER — Telehealth: Payer: Self-pay | Admitting: Internal Medicine

## 2017-01-09 VITALS — BP 92/60 | HR 69 | Ht 63.0 in | Wt 134.8 lb

## 2017-01-09 DIAGNOSIS — I2510 Atherosclerotic heart disease of native coronary artery without angina pectoris: Secondary | ICD-10-CM | POA: Diagnosis not present

## 2017-01-09 DIAGNOSIS — Z01810 Encounter for preprocedural cardiovascular examination: Secondary | ICD-10-CM | POA: Diagnosis not present

## 2017-01-09 DIAGNOSIS — Z886 Allergy status to analgesic agent status: Secondary | ICD-10-CM | POA: Diagnosis not present

## 2017-01-09 DIAGNOSIS — Z96641 Presence of right artificial hip joint: Secondary | ICD-10-CM | POA: Diagnosis not present

## 2017-01-09 DIAGNOSIS — R079 Chest pain, unspecified: Secondary | ICD-10-CM

## 2017-01-09 DIAGNOSIS — I10 Essential (primary) hypertension: Secondary | ICD-10-CM | POA: Diagnosis not present

## 2017-01-09 DIAGNOSIS — G43709 Chronic migraine without aura, not intractable, without status migrainosus: Secondary | ICD-10-CM | POA: Diagnosis not present

## 2017-01-09 DIAGNOSIS — R0789 Other chest pain: Secondary | ICD-10-CM | POA: Diagnosis not present

## 2017-01-09 DIAGNOSIS — N289 Disorder of kidney and ureter, unspecified: Secondary | ICD-10-CM | POA: Diagnosis not present

## 2017-01-09 DIAGNOSIS — E782 Mixed hyperlipidemia: Secondary | ICD-10-CM

## 2017-01-09 DIAGNOSIS — K219 Gastro-esophageal reflux disease without esophagitis: Secondary | ICD-10-CM | POA: Diagnosis not present

## 2017-01-09 DIAGNOSIS — Z7982 Long term (current) use of aspirin: Secondary | ICD-10-CM | POA: Diagnosis not present

## 2017-01-09 DIAGNOSIS — Z955 Presence of coronary angioplasty implant and graft: Secondary | ICD-10-CM | POA: Diagnosis not present

## 2017-01-09 DIAGNOSIS — M1990 Unspecified osteoarthritis, unspecified site: Secondary | ICD-10-CM | POA: Diagnosis not present

## 2017-01-09 DIAGNOSIS — E860 Dehydration: Secondary | ICD-10-CM | POA: Diagnosis not present

## 2017-01-09 DIAGNOSIS — R531 Weakness: Secondary | ICD-10-CM | POA: Diagnosis not present

## 2017-01-09 DIAGNOSIS — Z8249 Family history of ischemic heart disease and other diseases of the circulatory system: Secondary | ICD-10-CM | POA: Diagnosis not present

## 2017-01-09 DIAGNOSIS — M810 Age-related osteoporosis without current pathological fracture: Secondary | ICD-10-CM | POA: Diagnosis not present

## 2017-01-09 DIAGNOSIS — Z79899 Other long term (current) drug therapy: Secondary | ICD-10-CM | POA: Diagnosis not present

## 2017-01-09 NOTE — Patient Instructions (Addendum)
Your physician wants you to follow-up in: Natalia will receive a reminder letter in the mail two months in advance. If you don't receive a letter, please call our office to schedule the follow-up appointment.  Your physician has recommended you make the following change in your medication:   STOP AMLODIPINE [facility]  Your physician has requested that you have a lexiscan myoview. For further information please visit [email or web address]. Please follow instruction sheet, as given.  Thank you for choosing [facility]!!

## 2017-01-09 NOTE — Telephone Encounter (Signed)
We  Were  inforemd patient had  Been admitted  Overnight  And  Will be  Sent  To [facility]  If any  Increase in symptoms or abnl  Enzymes, pt being  Admitted  At [facility]
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
## 2017-01-10 ENCOUNTER — Telehealth: Payer: Self-pay | Admitting: Cardiology

## 2017-01-10 DIAGNOSIS — G43709 Chronic migraine without aura, not intractable, without status migrainosus: Secondary | ICD-10-CM | POA: Diagnosis not present

## 2017-01-10 DIAGNOSIS — I10 Essential (primary) hypertension: Secondary | ICD-10-CM | POA: Diagnosis not present

## 2017-01-10 DIAGNOSIS — R531 Weakness: Secondary | ICD-10-CM | POA: Diagnosis not present

## 2017-01-10 DIAGNOSIS — R0789 Other chest pain: Secondary | ICD-10-CM | POA: Diagnosis not present

## 2017-01-10 NOTE — Telephone Encounter (Signed)
Pre-cert Verification for the following procedure   Lexiscan scheduled for 01-23-17 at [facility]

## 2017-01-11 DIAGNOSIS — G43709 Chronic migraine without aura, not intractable, without status migrainosus: Secondary | ICD-10-CM | POA: Diagnosis not present

## 2017-01-11 DIAGNOSIS — R0789 Other chest pain: Secondary | ICD-10-CM | POA: Diagnosis not present

## 2017-01-11 DIAGNOSIS — R531 Weakness: Secondary | ICD-10-CM | POA: Diagnosis not present

## 2017-01-11 DIAGNOSIS — I10 Essential (primary) hypertension: Secondary | ICD-10-CM | POA: Diagnosis not present

## 2017-01-13 ENCOUNTER — Encounter: Payer: Self-pay | Admitting: Cardiology

## 2017-01-16 ENCOUNTER — Other Ambulatory Visit: Payer: Self-pay | Admitting: Cardiology

## 2017-01-23 ENCOUNTER — Encounter (HOSPITAL_COMMUNITY): Payer: Self-pay

## 2017-01-23 ENCOUNTER — Encounter (HOSPITAL_COMMUNITY)
Admission: RE | Admit: 2017-01-23 | Discharge: 2017-01-23 | Disposition: A | Discharge status: Home / Self Care | Payer: Medicare Other | Source: Ambulatory Visit | Attending: Cardiology | Admitting: Cardiology

## 2017-01-23 ENCOUNTER — Encounter (HOSPITAL_BASED_OUTPATIENT_CLINIC_OR_DEPARTMENT_OTHER)
Admission: RE | Admit: 2017-01-23 | Discharge: 2017-01-23 | Disposition: A | Discharge status: Home / Self Care | Payer: Medicare Other | Source: Ambulatory Visit | Attending: Cardiology | Admitting: Cardiology

## 2017-01-23 DIAGNOSIS — R079 Chest pain, unspecified: Secondary | ICD-10-CM

## 2017-01-23 LAB — NM MYOCAR MULTI W/SPECT W/WALL MOTION / EF
CHL CUP NUCLEAR SDS: 0
CSEPPHR: 96 {beats}/min
LV dias vol: 78 mL (ref 46–106)
LVSYSVOL: 35 mL
RATE: 0.31
Rest HR: 62 {beats}/min
SRS: 0
SSS: 0
TID: 0.84

## 2017-01-23 MED ORDER — REGADENOSON 0.4 MG/5ML IV SOLN
INTRAVENOUS | Status: AC
  Administered 2017-01-23: 0.4 mg via INTRAVENOUS
  Filled 2017-01-23: qty 5

## 2017-01-23 MED ORDER — SODIUM CHLORIDE 0.9% FLUSH
INTRAVENOUS | Status: AC
  Administered 2017-01-23: 10 mL via INTRAVENOUS
  Filled 2017-01-23: qty 10

## 2017-01-23 MED ORDER — TECHNETIUM TC 99M TETROFOSMIN IV KIT
10.0000 | PACK | Freq: Once | INTRAVENOUS | Status: AC | PRN
  Administered 2017-01-23: 10.6 via INTRAVENOUS

## 2017-01-23 MED ORDER — TECHNETIUM TC 99M TETROFOSMIN IV KIT
30.0000 | PACK | Freq: Once | INTRAVENOUS | Status: AC | PRN
  Administered 2017-01-23: 31 via INTRAVENOUS

## 2017-01-25 ENCOUNTER — Other Ambulatory Visit: Payer: Self-pay | Admitting: Cardiology

## 2017-01-26 ENCOUNTER — Telehealth: Payer: Self-pay | Admitting: Cardiology

## 2017-01-26 ENCOUNTER — Telehealth: Payer: Self-pay

## 2017-01-26 NOTE — Telephone Encounter (Signed)
Patient's daughter Jaclyn Hart calling about recent Stress test results.  Checking to see if patient has been cleared for surgery.

## 2017-01-26 NOTE — Telephone Encounter (Signed)
Daughter notified. Sent to Dr. Case as well

## 2017-01-26 NOTE — Telephone Encounter (Signed)
-----   Message from Arnoldo Lenis, MD sent at 01/25/2017  9:48 AM EST ----- Stress test looks good, no evidence of blockages. She is ok to proceed with her surgery, please forward my addended clinic note to her surgeon. Needs to f/u with pcp to discuss noncardiac causes of her symptoms.   Zandra Abts MD

## 2017-01-26 NOTE — Telephone Encounter (Signed)
LMTCB

## 2017-02-04 DIAGNOSIS — M4856XA Collapsed vertebra, not elsewhere classified, lumbar region, initial encounter for fracture: Secondary | ICD-10-CM | POA: Diagnosis not present

## 2017-02-04 DIAGNOSIS — Z7982 Long term (current) use of aspirin: Secondary | ICD-10-CM | POA: Diagnosis not present

## 2017-02-04 DIAGNOSIS — W1839XA Other fall on same level, initial encounter: Secondary | ICD-10-CM | POA: Diagnosis not present

## 2017-02-04 DIAGNOSIS — Z79899 Other long term (current) drug therapy: Secondary | ICD-10-CM | POA: Diagnosis not present

## 2017-02-04 DIAGNOSIS — I700 Atherosclerosis of aorta: Secondary | ICD-10-CM | POA: Diagnosis not present

## 2017-02-04 DIAGNOSIS — M810 Age-related osteoporosis without current pathological fracture: Secondary | ICD-10-CM | POA: Diagnosis not present

## 2017-02-04 DIAGNOSIS — S32020A Wedge compression fracture of second lumbar vertebra, initial encounter for closed fracture: Secondary | ICD-10-CM | POA: Diagnosis not present

## 2017-02-04 DIAGNOSIS — K219 Gastro-esophageal reflux disease without esophagitis: Secondary | ICD-10-CM | POA: Diagnosis not present

## 2017-02-04 DIAGNOSIS — I10 Essential (primary) hypertension: Secondary | ICD-10-CM | POA: Diagnosis not present

## 2017-02-09 DIAGNOSIS — E7849 Other hyperlipidemia: Secondary | ICD-10-CM | POA: Diagnosis not present

## 2017-02-09 DIAGNOSIS — I10 Essential (primary) hypertension: Secondary | ICD-10-CM | POA: Diagnosis not present

## 2017-02-09 DIAGNOSIS — S32018A Other fracture of first lumbar vertebra, initial encounter for closed fracture: Secondary | ICD-10-CM | POA: Diagnosis not present

## 2017-02-09 DIAGNOSIS — Z6824 Body mass index (BMI) 24.0-24.9, adult: Secondary | ICD-10-CM | POA: Diagnosis not present

## 2017-03-04 DIAGNOSIS — T1490XA Injury, unspecified, initial encounter: Secondary | ICD-10-CM | POA: Diagnosis not present

## 2017-04-03 DIAGNOSIS — W1839XA Other fall on same level, initial encounter: Secondary | ICD-10-CM | POA: Diagnosis not present

## 2017-04-03 DIAGNOSIS — S62102A Fracture of unspecified carpal bone, left wrist, initial encounter for closed fracture: Secondary | ICD-10-CM | POA: Diagnosis not present

## 2017-04-03 DIAGNOSIS — S52572A Other intraarticular fracture of lower end of left radius, initial encounter for closed fracture: Secondary | ICD-10-CM | POA: Diagnosis not present

## 2017-04-03 DIAGNOSIS — E7849 Other hyperlipidemia: Secondary | ICD-10-CM | POA: Diagnosis not present

## 2017-04-03 DIAGNOSIS — S52592A Other fractures of lower end of left radius, initial encounter for closed fracture: Secondary | ICD-10-CM | POA: Diagnosis not present

## 2017-04-03 DIAGNOSIS — I10 Essential (primary) hypertension: Secondary | ICD-10-CM | POA: Diagnosis not present

## 2017-04-09 DIAGNOSIS — S52502A Unspecified fracture of the lower end of left radius, initial encounter for closed fracture: Secondary | ICD-10-CM | POA: Diagnosis not present

## 2017-04-09 DIAGNOSIS — Z886 Allergy status to analgesic agent status: Secondary | ICD-10-CM | POA: Diagnosis not present

## 2017-04-09 DIAGNOSIS — K219 Gastro-esophageal reflux disease without esophagitis: Secondary | ICD-10-CM | POA: Diagnosis not present

## 2017-04-09 DIAGNOSIS — I252 Old myocardial infarction: Secondary | ICD-10-CM | POA: Diagnosis not present

## 2017-04-09 DIAGNOSIS — I10 Essential (primary) hypertension: Secondary | ICD-10-CM | POA: Diagnosis not present

## 2017-04-09 DIAGNOSIS — M810 Age-related osteoporosis without current pathological fracture: Secondary | ICD-10-CM | POA: Diagnosis not present

## 2017-04-09 DIAGNOSIS — M1990 Unspecified osteoarthritis, unspecified site: Secondary | ICD-10-CM | POA: Diagnosis not present

## 2017-04-09 DIAGNOSIS — Z8673 Personal history of transient ischemic attack (TIA), and cerebral infarction without residual deficits: Secondary | ICD-10-CM | POA: Diagnosis not present

## 2017-04-09 DIAGNOSIS — Z96641 Presence of right artificial hip joint: Secondary | ICD-10-CM | POA: Diagnosis not present

## 2017-04-09 DIAGNOSIS — S52552A Other extraarticular fracture of lower end of left radius, initial encounter for closed fracture: Secondary | ICD-10-CM | POA: Diagnosis not present

## 2017-04-09 DIAGNOSIS — S52602A Unspecified fracture of lower end of left ulna, initial encounter for closed fracture: Secondary | ICD-10-CM | POA: Diagnosis not present

## 2017-04-09 DIAGNOSIS — E785 Hyperlipidemia, unspecified: Secondary | ICD-10-CM | POA: Diagnosis not present

## 2017-04-09 DIAGNOSIS — W19XXXA Unspecified fall, initial encounter: Secondary | ICD-10-CM | POA: Diagnosis not present

## 2017-04-09 DIAGNOSIS — Z955 Presence of coronary angioplasty implant and graft: Secondary | ICD-10-CM | POA: Diagnosis not present

## 2017-04-10 DIAGNOSIS — Z96641 Presence of right artificial hip joint: Secondary | ICD-10-CM | POA: Diagnosis not present

## 2017-04-10 DIAGNOSIS — I10 Essential (primary) hypertension: Secondary | ICD-10-CM | POA: Diagnosis not present

## 2017-04-10 DIAGNOSIS — M1990 Unspecified osteoarthritis, unspecified site: Secondary | ICD-10-CM | POA: Diagnosis not present

## 2017-04-10 DIAGNOSIS — Z886 Allergy status to analgesic agent status: Secondary | ICD-10-CM | POA: Diagnosis not present

## 2017-04-10 DIAGNOSIS — S52502A Unspecified fracture of the lower end of left radius, initial encounter for closed fracture: Secondary | ICD-10-CM | POA: Diagnosis not present

## 2017-04-10 DIAGNOSIS — M810 Age-related osteoporosis without current pathological fracture: Secondary | ICD-10-CM | POA: Diagnosis not present

## 2017-04-10 DIAGNOSIS — S52552A Other extraarticular fracture of lower end of left radius, initial encounter for closed fracture: Secondary | ICD-10-CM | POA: Diagnosis not present

## 2017-04-10 DIAGNOSIS — E785 Hyperlipidemia, unspecified: Secondary | ICD-10-CM | POA: Diagnosis not present

## 2017-04-10 DIAGNOSIS — S59292D Other physeal fracture of lower end of radius, left arm, subsequent encounter for fracture with routine healing: Secondary | ICD-10-CM | POA: Diagnosis not present

## 2017-04-10 DIAGNOSIS — Z955 Presence of coronary angioplasty implant and graft: Secondary | ICD-10-CM | POA: Diagnosis not present

## 2017-04-10 DIAGNOSIS — Z8673 Personal history of transient ischemic attack (TIA), and cerebral infarction without residual deficits: Secondary | ICD-10-CM | POA: Diagnosis not present

## 2017-04-10 DIAGNOSIS — I252 Old myocardial infarction: Secondary | ICD-10-CM | POA: Diagnosis not present

## 2017-04-10 DIAGNOSIS — K219 Gastro-esophageal reflux disease without esophagitis: Secondary | ICD-10-CM | POA: Diagnosis not present

## 2017-04-18 DIAGNOSIS — R109 Unspecified abdominal pain: Secondary | ICD-10-CM | POA: Diagnosis not present

## 2017-04-18 DIAGNOSIS — Z87442 Personal history of urinary calculi: Secondary | ICD-10-CM | POA: Diagnosis not present

## 2017-04-18 DIAGNOSIS — K449 Diaphragmatic hernia without obstruction or gangrene: Secondary | ICD-10-CM | POA: Diagnosis not present

## 2017-04-18 DIAGNOSIS — Z79899 Other long term (current) drug therapy: Secondary | ICD-10-CM | POA: Diagnosis not present

## 2017-04-18 DIAGNOSIS — K5900 Constipation, unspecified: Secondary | ICD-10-CM | POA: Diagnosis not present

## 2017-04-18 DIAGNOSIS — I252 Old myocardial infarction: Secondary | ICD-10-CM | POA: Diagnosis not present

## 2017-04-18 DIAGNOSIS — I700 Atherosclerosis of aorta: Secondary | ICD-10-CM | POA: Diagnosis not present

## 2017-04-18 DIAGNOSIS — S62102D Fracture of unspecified carpal bone, left wrist, subsequent encounter for fracture with routine healing: Secondary | ICD-10-CM | POA: Diagnosis not present

## 2017-04-18 DIAGNOSIS — J189 Pneumonia, unspecified organism: Secondary | ICD-10-CM | POA: Diagnosis not present

## 2017-04-18 DIAGNOSIS — R319 Hematuria, unspecified: Secondary | ICD-10-CM | POA: Diagnosis not present

## 2017-04-18 DIAGNOSIS — Z7982 Long term (current) use of aspirin: Secondary | ICD-10-CM | POA: Diagnosis not present

## 2017-04-18 DIAGNOSIS — J17 Pneumonia in diseases classified elsewhere: Secondary | ICD-10-CM | POA: Diagnosis not present

## 2017-04-18 DIAGNOSIS — R1084 Generalized abdominal pain: Secondary | ICD-10-CM | POA: Diagnosis not present

## 2017-04-18 DIAGNOSIS — G43909 Migraine, unspecified, not intractable, without status migrainosus: Secondary | ICD-10-CM | POA: Diagnosis not present

## 2017-04-18 DIAGNOSIS — Z885 Allergy status to narcotic agent status: Secondary | ICD-10-CM | POA: Diagnosis not present

## 2017-04-18 DIAGNOSIS — N390 Urinary tract infection, site not specified: Secondary | ICD-10-CM | POA: Diagnosis not present

## 2017-04-18 DIAGNOSIS — I10 Essential (primary) hypertension: Secondary | ICD-10-CM | POA: Diagnosis not present

## 2017-04-18 DIAGNOSIS — K219 Gastro-esophageal reflux disease without esophagitis: Secondary | ICD-10-CM | POA: Diagnosis not present

## 2017-04-18 DIAGNOSIS — E7800 Pure hypercholesterolemia, unspecified: Secondary | ICD-10-CM | POA: Diagnosis not present

## 2017-04-18 DIAGNOSIS — G43009 Migraine without aura, not intractable, without status migrainosus: Secondary | ICD-10-CM | POA: Diagnosis not present

## 2017-04-18 DIAGNOSIS — N289 Disorder of kidney and ureter, unspecified: Secondary | ICD-10-CM | POA: Diagnosis not present

## 2017-04-18 DIAGNOSIS — K5909 Other constipation: Secondary | ICD-10-CM | POA: Diagnosis not present

## 2017-04-19 DIAGNOSIS — Z7982 Long term (current) use of aspirin: Secondary | ICD-10-CM | POA: Diagnosis not present

## 2017-04-19 DIAGNOSIS — Z87442 Personal history of urinary calculi: Secondary | ICD-10-CM | POA: Diagnosis not present

## 2017-04-19 DIAGNOSIS — Z885 Allergy status to narcotic agent status: Secondary | ICD-10-CM | POA: Diagnosis not present

## 2017-04-19 DIAGNOSIS — I700 Atherosclerosis of aorta: Secondary | ICD-10-CM | POA: Diagnosis not present

## 2017-04-19 DIAGNOSIS — G43909 Migraine, unspecified, not intractable, without status migrainosus: Secondary | ICD-10-CM | POA: Diagnosis not present

## 2017-04-19 DIAGNOSIS — N289 Disorder of kidney and ureter, unspecified: Secondary | ICD-10-CM | POA: Diagnosis not present

## 2017-04-19 DIAGNOSIS — I252 Old myocardial infarction: Secondary | ICD-10-CM | POA: Diagnosis not present

## 2017-04-19 DIAGNOSIS — Z79899 Other long term (current) drug therapy: Secondary | ICD-10-CM | POA: Diagnosis not present

## 2017-04-19 DIAGNOSIS — K219 Gastro-esophageal reflux disease without esophagitis: Secondary | ICD-10-CM | POA: Diagnosis not present

## 2017-04-19 DIAGNOSIS — J189 Pneumonia, unspecified organism: Secondary | ICD-10-CM | POA: Diagnosis not present

## 2017-04-19 DIAGNOSIS — K5900 Constipation, unspecified: Secondary | ICD-10-CM | POA: Diagnosis not present

## 2017-04-19 DIAGNOSIS — K449 Diaphragmatic hernia without obstruction or gangrene: Secondary | ICD-10-CM | POA: Diagnosis not present

## 2017-04-19 DIAGNOSIS — E7800 Pure hypercholesterolemia, unspecified: Secondary | ICD-10-CM | POA: Diagnosis not present

## 2017-04-19 DIAGNOSIS — S62102D Fracture of unspecified carpal bone, left wrist, subsequent encounter for fracture with routine healing: Secondary | ICD-10-CM | POA: Diagnosis not present

## 2017-04-19 DIAGNOSIS — I10 Essential (primary) hypertension: Secondary | ICD-10-CM | POA: Diagnosis not present

## 2017-04-19 DIAGNOSIS — N390 Urinary tract infection, site not specified: Secondary | ICD-10-CM | POA: Diagnosis not present

## 2017-04-20 DIAGNOSIS — Z87442 Personal history of urinary calculi: Secondary | ICD-10-CM | POA: Diagnosis not present

## 2017-04-20 DIAGNOSIS — K5900 Constipation, unspecified: Secondary | ICD-10-CM | POA: Diagnosis not present

## 2017-04-20 DIAGNOSIS — N289 Disorder of kidney and ureter, unspecified: Secondary | ICD-10-CM | POA: Diagnosis not present

## 2017-04-20 DIAGNOSIS — Z79899 Other long term (current) drug therapy: Secondary | ICD-10-CM | POA: Diagnosis not present

## 2017-04-20 DIAGNOSIS — G43909 Migraine, unspecified, not intractable, without status migrainosus: Secondary | ICD-10-CM | POA: Diagnosis not present

## 2017-04-20 DIAGNOSIS — N390 Urinary tract infection, site not specified: Secondary | ICD-10-CM | POA: Diagnosis not present

## 2017-04-20 DIAGNOSIS — Z885 Allergy status to narcotic agent status: Secondary | ICD-10-CM | POA: Diagnosis not present

## 2017-04-20 DIAGNOSIS — Z7982 Long term (current) use of aspirin: Secondary | ICD-10-CM | POA: Diagnosis not present

## 2017-04-20 DIAGNOSIS — E7800 Pure hypercholesterolemia, unspecified: Secondary | ICD-10-CM | POA: Diagnosis not present

## 2017-04-20 DIAGNOSIS — I10 Essential (primary) hypertension: Secondary | ICD-10-CM | POA: Diagnosis not present

## 2017-04-20 DIAGNOSIS — K219 Gastro-esophageal reflux disease without esophagitis: Secondary | ICD-10-CM | POA: Diagnosis not present

## 2017-04-20 DIAGNOSIS — I700 Atherosclerosis of aorta: Secondary | ICD-10-CM | POA: Diagnosis not present

## 2017-04-20 DIAGNOSIS — J189 Pneumonia, unspecified organism: Secondary | ICD-10-CM | POA: Diagnosis not present

## 2017-04-20 DIAGNOSIS — K449 Diaphragmatic hernia without obstruction or gangrene: Secondary | ICD-10-CM | POA: Diagnosis not present

## 2017-04-20 DIAGNOSIS — S62102D Fracture of unspecified carpal bone, left wrist, subsequent encounter for fracture with routine healing: Secondary | ICD-10-CM | POA: Diagnosis not present

## 2017-04-20 DIAGNOSIS — I252 Old myocardial infarction: Secondary | ICD-10-CM | POA: Diagnosis not present

## 2017-04-24 DIAGNOSIS — J168 Pneumonia due to other specified infectious organisms: Secondary | ICD-10-CM | POA: Diagnosis not present

## 2017-04-24 DIAGNOSIS — Z6824 Body mass index (BMI) 24.0-24.9, adult: Secondary | ICD-10-CM | POA: Diagnosis not present

## 2017-04-24 DIAGNOSIS — S52602D Unspecified fracture of lower end of left ulna, subsequent encounter for closed fracture with routine healing: Secondary | ICD-10-CM | POA: Diagnosis not present

## 2017-04-24 DIAGNOSIS — S52502D Unspecified fracture of the lower end of left radius, subsequent encounter for closed fracture with routine healing: Secondary | ICD-10-CM | POA: Diagnosis not present

## 2017-05-17 ENCOUNTER — Other Ambulatory Visit: Payer: Self-pay | Admitting: Cardiology

## 2017-05-26 DIAGNOSIS — Z7982 Long term (current) use of aspirin: Secondary | ICD-10-CM | POA: Diagnosis not present

## 2017-05-26 DIAGNOSIS — I10 Essential (primary) hypertension: Secondary | ICD-10-CM | POA: Diagnosis not present

## 2017-05-26 DIAGNOSIS — M25511 Pain in right shoulder: Secondary | ICD-10-CM | POA: Diagnosis not present

## 2017-05-26 DIAGNOSIS — M19011 Primary osteoarthritis, right shoulder: Secondary | ICD-10-CM | POA: Diagnosis not present

## 2017-05-26 DIAGNOSIS — E7800 Pure hypercholesterolemia, unspecified: Secondary | ICD-10-CM | POA: Diagnosis not present

## 2017-05-26 DIAGNOSIS — X500XXA Overexertion from strenuous movement or load, initial encounter: Secondary | ICD-10-CM | POA: Diagnosis not present

## 2017-05-26 DIAGNOSIS — S42124A Nondisplaced fracture of acromial process, right shoulder, initial encounter for closed fracture: Secondary | ICD-10-CM | POA: Diagnosis not present

## 2017-05-26 DIAGNOSIS — M810 Age-related osteoporosis without current pathological fracture: Secondary | ICD-10-CM | POA: Diagnosis not present

## 2017-05-26 DIAGNOSIS — Z79899 Other long term (current) drug therapy: Secondary | ICD-10-CM | POA: Diagnosis not present

## 2017-05-30 DIAGNOSIS — S42124A Nondisplaced fracture of acromial process, right shoulder, initial encounter for closed fracture: Secondary | ICD-10-CM | POA: Diagnosis not present

## 2017-05-30 DIAGNOSIS — S52502D Unspecified fracture of the lower end of left radius, subsequent encounter for closed fracture with routine healing: Secondary | ICD-10-CM | POA: Diagnosis not present

## 2017-05-30 DIAGNOSIS — M797 Fibromyalgia: Secondary | ICD-10-CM | POA: Diagnosis not present

## 2017-05-30 DIAGNOSIS — N182 Chronic kidney disease, stage 2 (mild): Secondary | ICD-10-CM | POA: Diagnosis not present

## 2017-05-30 DIAGNOSIS — S52602D Unspecified fracture of lower end of left ulna, subsequent encounter for closed fracture with routine healing: Secondary | ICD-10-CM | POA: Diagnosis not present

## 2017-05-30 DIAGNOSIS — E7849 Other hyperlipidemia: Secondary | ICD-10-CM | POA: Diagnosis not present

## 2017-05-30 DIAGNOSIS — I10 Essential (primary) hypertension: Secondary | ICD-10-CM | POA: Diagnosis not present

## 2017-07-03 DIAGNOSIS — M797 Fibromyalgia: Secondary | ICD-10-CM | POA: Diagnosis not present

## 2017-07-03 DIAGNOSIS — N182 Chronic kidney disease, stage 2 (mild): Secondary | ICD-10-CM | POA: Diagnosis not present

## 2017-07-03 DIAGNOSIS — I10 Essential (primary) hypertension: Secondary | ICD-10-CM | POA: Diagnosis not present

## 2017-07-03 DIAGNOSIS — E7849 Other hyperlipidemia: Secondary | ICD-10-CM | POA: Diagnosis not present

## 2017-07-12 DIAGNOSIS — S42124D Nondisplaced fracture of acromial process, right shoulder, subsequent encounter for fracture with routine healing: Secondary | ICD-10-CM | POA: Diagnosis not present

## 2017-07-24 ENCOUNTER — Ambulatory Visit: Payer: Medicare Other | Admitting: Cardiology

## 2017-07-26 ENCOUNTER — Other Ambulatory Visit: Payer: Self-pay | Admitting: Cardiology

## 2017-07-31 DIAGNOSIS — Z1389 Encounter for screening for other disorder: Secondary | ICD-10-CM | POA: Diagnosis not present

## 2017-07-31 DIAGNOSIS — K219 Gastro-esophageal reflux disease without esophagitis: Secondary | ICD-10-CM | POA: Diagnosis not present

## 2017-07-31 DIAGNOSIS — I10 Essential (primary) hypertension: Secondary | ICD-10-CM | POA: Diagnosis not present

## 2017-07-31 DIAGNOSIS — G4733 Obstructive sleep apnea (adult) (pediatric): Secondary | ICD-10-CM | POA: Diagnosis not present

## 2017-07-31 DIAGNOSIS — G43109 Migraine with aura, not intractable, without status migrainosus: Secondary | ICD-10-CM | POA: Diagnosis not present

## 2017-07-31 DIAGNOSIS — Z0000 Encounter for general adult medical examination without abnormal findings: Secondary | ICD-10-CM | POA: Diagnosis not present

## 2017-07-31 DIAGNOSIS — M1712 Unilateral primary osteoarthritis, left knee: Secondary | ICD-10-CM | POA: Diagnosis not present

## 2017-08-03 DIAGNOSIS — W19XXXA Unspecified fall, initial encounter: Secondary | ICD-10-CM | POA: Diagnosis not present

## 2017-08-03 DIAGNOSIS — R52 Pain, unspecified: Secondary | ICD-10-CM | POA: Diagnosis not present

## 2017-08-15 ENCOUNTER — Other Ambulatory Visit: Payer: Self-pay | Admitting: Cardiology

## 2017-09-18 ENCOUNTER — Ambulatory Visit: Payer: Medicare Other | Admitting: Cardiology

## 2017-10-16 DIAGNOSIS — K219 Gastro-esophageal reflux disease without esophagitis: Secondary | ICD-10-CM | POA: Diagnosis not present

## 2017-10-16 DIAGNOSIS — M797 Fibromyalgia: Secondary | ICD-10-CM | POA: Diagnosis not present

## 2017-10-16 DIAGNOSIS — I10 Essential (primary) hypertension: Secondary | ICD-10-CM | POA: Diagnosis not present

## 2017-10-16 DIAGNOSIS — M1712 Unilateral primary osteoarthritis, left knee: Secondary | ICD-10-CM | POA: Diagnosis not present

## 2017-11-06 DIAGNOSIS — G4733 Obstructive sleep apnea (adult) (pediatric): Secondary | ICD-10-CM | POA: Diagnosis not present

## 2017-11-06 DIAGNOSIS — Z79899 Other long term (current) drug therapy: Secondary | ICD-10-CM | POA: Diagnosis not present

## 2017-11-06 DIAGNOSIS — Z1389 Encounter for screening for other disorder: Secondary | ICD-10-CM | POA: Diagnosis not present

## 2017-11-06 DIAGNOSIS — Z0000 Encounter for general adult medical examination without abnormal findings: Secondary | ICD-10-CM | POA: Diagnosis not present

## 2017-11-06 DIAGNOSIS — M797 Fibromyalgia: Secondary | ICD-10-CM | POA: Diagnosis not present

## 2017-11-06 DIAGNOSIS — G43109 Migraine with aura, not intractable, without status migrainosus: Secondary | ICD-10-CM | POA: Diagnosis not present

## 2017-11-06 DIAGNOSIS — M1712 Unilateral primary osteoarthritis, left knee: Secondary | ICD-10-CM | POA: Diagnosis not present

## 2017-11-06 DIAGNOSIS — K219 Gastro-esophageal reflux disease without esophagitis: Secondary | ICD-10-CM | POA: Diagnosis not present

## 2017-11-06 DIAGNOSIS — I10 Essential (primary) hypertension: Secondary | ICD-10-CM | POA: Diagnosis not present

## 2017-11-09 ENCOUNTER — Other Ambulatory Visit: Payer: Self-pay | Admitting: Cardiology

## 2017-11-20 ENCOUNTER — Ambulatory Visit: Payer: Medicare Other | Admitting: Cardiology

## 2017-12-02 DIAGNOSIS — W19XXXA Unspecified fall, initial encounter: Secondary | ICD-10-CM | POA: Diagnosis not present

## 2017-12-02 DIAGNOSIS — R5381 Other malaise: Secondary | ICD-10-CM | POA: Diagnosis not present

## 2017-12-02 DIAGNOSIS — R69 Illness, unspecified: Secondary | ICD-10-CM | POA: Diagnosis not present

## 2017-12-10 DIAGNOSIS — I10 Essential (primary) hypertension: Secondary | ICD-10-CM | POA: Diagnosis not present

## 2017-12-10 DIAGNOSIS — K219 Gastro-esophageal reflux disease without esophagitis: Secondary | ICD-10-CM | POA: Diagnosis not present

## 2017-12-10 DIAGNOSIS — M1712 Unilateral primary osteoarthritis, left knee: Secondary | ICD-10-CM | POA: Diagnosis not present

## 2017-12-10 DIAGNOSIS — M797 Fibromyalgia: Secondary | ICD-10-CM | POA: Diagnosis not present

## 2017-12-14 DIAGNOSIS — G8929 Other chronic pain: Secondary | ICD-10-CM | POA: Diagnosis not present

## 2017-12-14 DIAGNOSIS — M25562 Pain in left knee: Secondary | ICD-10-CM | POA: Diagnosis not present

## 2017-12-14 DIAGNOSIS — M1712 Unilateral primary osteoarthritis, left knee: Secondary | ICD-10-CM | POA: Diagnosis not present

## 2017-12-19 DIAGNOSIS — I10 Essential (primary) hypertension: Secondary | ICD-10-CM | POA: Diagnosis present

## 2017-12-19 DIAGNOSIS — M170 Bilateral primary osteoarthritis of knee: Secondary | ICD-10-CM | POA: Diagnosis not present

## 2017-12-19 DIAGNOSIS — I639 Cerebral infarction, unspecified: Secondary | ICD-10-CM | POA: Diagnosis present

## 2018-01-10 ENCOUNTER — Other Ambulatory Visit: Payer: Self-pay | Admitting: Cardiology

## 2018-01-16 DIAGNOSIS — M25562 Pain in left knee: Secondary | ICD-10-CM | POA: Diagnosis not present

## 2018-01-16 DIAGNOSIS — M25561 Pain in right knee: Secondary | ICD-10-CM | POA: Diagnosis not present

## 2018-01-21 ENCOUNTER — Other Ambulatory Visit: Payer: Self-pay | Admitting: Cardiology

## 2018-02-05 ENCOUNTER — Encounter

## 2018-02-05 ENCOUNTER — Encounter: Payer: Self-pay | Admitting: Cardiology

## 2018-02-05 ENCOUNTER — Ambulatory Visit: Payer: Medicare Other | Admitting: Cardiology

## 2018-02-05 VITALS — BP 116/80 | HR 64 | Ht 63.0 in | Wt 136.0 lb

## 2018-02-05 DIAGNOSIS — I2510 Atherosclerotic heart disease of native coronary artery without angina pectoris: Secondary | ICD-10-CM

## 2018-02-05 DIAGNOSIS — E782 Mixed hyperlipidemia: Secondary | ICD-10-CM | POA: Diagnosis not present

## 2018-02-05 DIAGNOSIS — I10 Essential (primary) hypertension: Secondary | ICD-10-CM
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
## 2018-02-05 NOTE — Patient Instructions (Signed)

## 2018-02-07 ENCOUNTER — Encounter: Payer: Self-pay | Admitting: *Deleted

## 2018-02-10 ENCOUNTER — Other Ambulatory Visit: Payer: Self-pay | Admitting: Cardiology

## 2018-02-12 DIAGNOSIS — M1712 Unilateral primary osteoarthritis, left knee: Secondary | ICD-10-CM | POA: Diagnosis not present

## 2018-02-12 DIAGNOSIS — Z780 Asymptomatic menopausal state: Secondary | ICD-10-CM | POA: Diagnosis not present

## 2018-02-12 DIAGNOSIS — I10 Essential (primary) hypertension: Secondary | ICD-10-CM | POA: Diagnosis not present

## 2018-02-12 DIAGNOSIS — M797 Fibromyalgia: Secondary | ICD-10-CM | POA: Diagnosis not present

## 2018-02-12 DIAGNOSIS — K219 Gastro-esophageal reflux disease without esophagitis: Secondary | ICD-10-CM | POA: Diagnosis not present

## 2018-02-12 DIAGNOSIS — Z96641 Presence of right artificial hip joint: Secondary | ICD-10-CM | POA: Diagnosis not present

## 2018-02-12 DIAGNOSIS — M85839 Other specified disorders of bone density and structure, unspecified forearm: Secondary | ICD-10-CM | POA: Diagnosis not present

## 2018-02-12 DIAGNOSIS — M810 Age-related osteoporosis without current pathological fracture: Secondary | ICD-10-CM | POA: Diagnosis not present

## 2018-02-12 DIAGNOSIS — G4733 Obstructive sleep apnea (adult) (pediatric): Secondary | ICD-10-CM | POA: Diagnosis not present

## 2018-02-21 DIAGNOSIS — M1712 Unilateral primary osteoarthritis, left knee: Secondary | ICD-10-CM | POA: Insufficient documentation
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
## 2018-03-01 NOTE — Progress Notes (Signed)
02-05-18 ( Epic) Cardiac Clearance Dr. Harl Bowie, and EKG  02-07-18 Surgical Clearance on chart from Dr. Sherrie Sport

## 2018-03-01 NOTE — Patient Instructions (Signed)
Jaclyn Hart  03/01/2018   Your procedure is scheduled on: 03-12-17    Report to [facility] Main  Entrance    Report to Admitting at 11:50 AM    Call this number if you have problems the morning of surgery [phone]    Remember: Do not eat food or drink liquids :After Midnight. You may have a Clear Liquid Diet from Midnight until 8:20 AM. After 8:20 AM, nothing until after surgery.      CLEAR LIQUID DIET   Foods Allowed                                                                     Foods Excluded  Coffee and tea, regular and decaf                             liquids that you cannot  Plain Jell-O in any flavor                                             see through such as: Fruit ices (not with fruit pulp)                                     milk, soups, orange juice  Iced Popsicles                                    All solid food Carbonated beverages, regular and diet                                    Cranberry, grape and apple juices Sports drinks like Gatorade Lightly seasoned clear broth or consume(fat free) Sugar, honey syrup  Sample Menu Breakfast                                Lunch                                     Supper Cranberry juice                    Beef broth                            Chicken broth Jell-O                                     Grape juice  Apple juice Coffee or tea                        Jell-O                                      Popsicle                                                Coffee or tea                        Coffee or tea  _____________________________________________________________________   BRUSH YOUR TEETH MORNING OF SURGERY AND RINSE YOUR MOUTH OUT, NO CHEWING GUM CANDY OR MINTS.     Take these medicines the morning of surgery with A SIP OF WATER: Alprazolam (Xanax), Fluoxetine (Prozac), Metoprolol (Lopressor), Pantoprazole (Protonix), and Ranitidine  (Zantac)                                You may not have any metal on your body including hair pins and              piercings  Do not wear jewelry, make-up, lotions, powders or perfumes, deodorant             Do not wear nail polish.  Do not shave  48 hours prior to surgery.                Do not bring valuables to the hospital. [place].  Contacts, dentures or bridgework may not be worn into surgery.  Leave suitcase in the car. After surgery it may be brought to your room.    Patients discharged the day of surgery will not be allowed to drive home. IF YOU ARE HAVING SURGERY AND GOING HOME THE SAME DAY, YOU MUST HAVE AN ADULT TO DRIVE YOU HOME AND BE WITH YOU FOR 24 HOURS. YOU MAY GO HOME BY TAXI OR UBER OR ORTHERWISE, BUT AN ADULT MUST ACCOMPANY YOU HOME AND STAY WITH YOU FOR 24 HOURS.    Name and phone number of your driver:  Special Instructions: N/A              Please read over the following fact sheets you were given: _____________________________________________________________________             [facility] - Preparing for Surgery Before surgery, you can play an important role.  Because skin is not sterile, your skin needs to be as free of germs as possible.  You can reduce the number of germs on your skin by washing with CHG (chlorahexidine gluconate) soap before surgery.  CHG is an antiseptic cleaner which kills germs and bonds with the skin to continue killing germs even after washing. Please DO NOT use if you have an allergy to CHG or antibacterial soaps.  If your skin becomes reddened/irritated stop using the CHG and inform your nurse when you arrive at Short Stay. Do not shave (including legs and underarms) for at least 48 hours prior to the first CHG shower.  You  may shave your face/neck. Please follow these instructions carefully:  1.  Shower with CHG Soap the night before surgery and the  morning of Surgery.  2.  If  you choose to wash your hair, wash your hair first as usual with your  normal  shampoo.  3.  After you shampoo, rinse your hair and body thoroughly to remove the  shampoo.                           4.  Use CHG as you would any other liquid soap.  You can apply chg directly  to the skin and wash                       Gently with a scrungie or clean washcloth.  5.  Apply the CHG Soap to your body ONLY FROM THE NECK DOWN.   Do not use on face/ open                           Wound or open sores. Avoid contact with eyes, ears mouth and genitals (private parts).                       Wash face,  Genitals (private parts) with your normal soap.             6.  Wash thoroughly, paying special attention to the area where your surgery  will be performed.  7.  Thoroughly rinse your body with warm water from the neck down.  8.  DO NOT shower/wash with your normal soap after using and rinsing off  the CHG Soap.                9.  Pat yourself dry with a clean towel.            10.  Wear clean pajamas.            11.  Place clean sheets on your bed the night of your first shower and do not  sleep with pets. Day of Surgery : Do not apply any lotions/deodorants the morning of surgery.  Please wear clean clothes to the hospital/surgery center.  FAILURE TO FOLLOW THESE INSTRUCTIONS MAY RESULT IN THE CANCELLATION OF YOUR SURGERY PATIENT SIGNATURE_________________________________  NURSE SIGNATURE__________________________________  ________________________________________________________________________   Jaclyn Hart  An incentive spirometer is a tool that can help keep your lungs clear and active. This tool measures how well you are filling your lungs with each breath. Taking long deep breaths may help reverse or decrease the chance of developing breathing (pulmonary) problems (especially infection) following:  A long period of time when you are unable to move or be active. BEFORE THE PROCEDURE    If the spirometer includes an indicator to show your best effort, your nurse or respiratory therapist will set it to a desired goal.  If possible, sit up straight or lean slightly forward. Try not to slouch.  Hold the incentive spirometer in an upright position. INSTRUCTIONS FOR USE  1. Sit on the edge of your bed if possible, or sit up as far as you can in bed or on a chair. 2. Hold the incentive spirometer in an upright position. 3. Breathe out normally. 4. Place the mouthpiece in your mouth and seal your lips tightly around it. 5. Breathe in slowly and as deeply as possible, raising  the piston or the ball toward the top of the column. 6. Hold your breath for 3-5 seconds or for as long as possible. Allow the piston or ball to fall to the bottom of the column. 7. Remove the mouthpiece from your mouth and breathe out normally. 8. Rest for a few seconds and repeat Steps 1 through 7 at least 10 times every 1-2 hours when you are awake. Take your time and take a few normal breaths between deep breaths. 9. The spirometer may include an indicator to show your best effort. Use the indicator as a goal to work toward during each repetition. 10. After each set of 10 deep breaths, practice coughing to be sure your lungs are clear. If you have an incision (the cut made at the time of surgery), support your incision when coughing by placing a pillow or rolled up towels firmly against it. Once you are able to get out of bed, walk around indoors and cough well. You may stop using the incentive spirometer when instructed by your caregiver.  RISKS AND COMPLICATIONS  Take your time so you do not get dizzy or light-headed.  If you are in pain, you may need to take or ask for pain medication before doing incentive spirometry. It is harder to take a deep breath if you are having pain. AFTER USE  Rest and breathe slowly and easily.  It can be helpful to keep track of a log of your progress. Your caregiver  can provide you with a simple table to help with this. If you are using the spirometer at home, follow these instructions: [place] IF:   You are having difficultly using the spirometer.  You have trouble using the spirometer as often as instructed.  Your pain medication is not giving enough relief while using the spirometer.  You develop fever of 100.5 F (38.1 C) or higher. SEEK IMMEDIATE MEDICAL CARE IF:   You cough up bloody sputum that had not been present before.  You develop fever of 102 F (38.9 C) or greater.  You develop worsening pain at or near the incision site. MAKE SURE YOU:   Understand these instructions.  Will watch your condition.  Will get help right away if you are not doing well or get worse. Document Released: 06/26/2006 Document Revised: 05/08/2011 Document Reviewed: 08/27/2006 ExitCare Patient Information 2014 Exit[employer], [place].   ________________________________________________________________________  WHAT IS A BLOOD TRANSFUSION? Blood Transfusion Information  A transfusion is the replacement of blood or some of its parts. Blood is made up of multiple cells which provide different functions.  Red blood cells carry oxygen and are used for blood loss replacement.  White blood cells fight against infection.  Platelets control bleeding.  Plasma helps clot blood.  Other blood products are available for specialized needs, such as hemophilia or other clotting disorders. BEFORE THE TRANSFUSION  Who gives blood for transfusions?   Healthy volunteers who are fully evaluated to make sure their blood is safe. This is blood bank blood. Transfusion therapy is the safest it has ever been in the practice of medicine. Before blood is taken from a donor, a complete history is taken to make sure that person has no history of diseases nor engages in risky social behavior (examples are intravenous drug use or sexual activity with multiple partners). The  donor's travel history is screened to minimize risk of transmitting infections, such as malaria. The donated blood is tested for signs of infectious diseases, such as HIV  and hepatitis. The blood is then tested to be sure it is compatible with you in order to minimize the chance of a transfusion reaction. If you or a relative donates blood, this is often done in anticipation of surgery and is not appropriate for emergency situations. It takes many days to process the donated blood. RISKS AND COMPLICATIONS Although transfusion therapy is very safe and saves many lives, the main dangers of transfusion include:   Getting an infectious disease.  Developing a transfusion reaction. This is an allergic reaction to something in the blood you were given. Every precaution is taken to prevent this. The decision to have a blood transfusion has been considered carefully by your caregiver before blood is given. Blood is not given unless the benefits outweigh the risks. AFTER THE TRANSFUSION  Right after receiving a blood transfusion, you will usually feel much better and more energetic. This is especially true if your red blood cells have gotten low (anemic). The transfusion raises the level of the red blood cells which carry oxygen, and this usually causes an energy increase.  The nurse administering the transfusion will monitor you carefully for complications. HOME CARE INSTRUCTIONS  No special instructions are needed after a transfusion. You may find your energy is better. Speak with your caregiver about any limitations on activity for underlying diseases you may have. SEEK MEDICAL CARE IF:   Your condition is not improving after your transfusion.  You develop redness or irritation at the intravenous (IV) site. SEEK IMMEDIATE MEDICAL CARE IF:  Any of the following symptoms occur over the next 12 hours:  Shaking chills.  You have a temperature by mouth above 102 F (38.9 C), not controlled by  medicine.  Chest, back, or muscle pain.  People around you feel you are not acting correctly or are confused.  Shortness of breath or difficulty breathing.  Dizziness and fainting.  You get a rash or develop hives.  You have a decrease in urine output.  Your urine turns a dark color or changes to pink, red, or brown. Any of the following symptoms occur over the next 10 days:  You have a temperature by mouth above 102 F (38.9 C), not controlled by medicine.  Shortness of breath.  Weakness after normal activity.  The white part of the eye turns yellow (jaundice).  You have a decrease in the amount of urine or are urinating less often.  Your urine turns a dark color or changes to pink, red, or brown. Document Released: 02/11/2000 Document Revised: 05/08/2011 Document Reviewed: 09/30/2007 [facility] Patient Information 2014 [place], [place].  _______________________________________________________________________

## 2018-03-05 ENCOUNTER — Encounter (HOSPITAL_COMMUNITY)
Admission: RE | Admit: 2018-03-05 | Discharge: 2018-03-05 | Disposition: A | Discharge status: Home / Self Care | Payer: Medicare Other | Source: Ambulatory Visit | Attending: Orthopedic Surgery | Admitting: Orthopedic Surgery

## 2018-03-05 ENCOUNTER — Encounter (HOSPITAL_COMMUNITY): Payer: Self-pay

## 2018-03-05 ENCOUNTER — Other Ambulatory Visit: Payer: Self-pay

## 2018-03-05 DIAGNOSIS — Z01812 Encounter for preprocedural laboratory examination: Secondary | ICD-10-CM | POA: Diagnosis not present

## 2018-03-05 LAB — CBC
HCT: 36.1 % (ref 36.0–46.0)
HEMOGLOBIN: 10.7 g/dL — AB (ref 12.0–15.0)
MCH: 30.2 pg (ref 26.0–34.0)
MCHC: 29.6 g/dL — ABNORMAL LOW (ref 30.0–36.0)
MCV: 102 fL — ABNORMAL HIGH (ref 80.0–100.0)
Platelets: 264 10*3/uL (ref 150–400)
RBC: 3.54 MIL/uL — ABNORMAL LOW (ref 3.87–5.11)
RDW: 14.8 % (ref 11.5–15.5)
WBC: 5.4 10*3/uL (ref 4.0–10.5)
nRBC: 0 % (ref 0.0–0.2)

## 2018-03-05 LAB — BASIC METABOLIC PANEL
ANION GAP: 9 (ref 5–15)
BUN: 64 mg/dL — ABNORMAL HIGH (ref 8–23)
CO2: 23 mmol/L (ref 22–32)
Calcium: 8.6 mg/dL — ABNORMAL LOW (ref 8.9–10.3)
Chloride: 109 mmol/L (ref 98–111)
Creatinine, Ser: 1.61 mg/dL — ABNORMAL HIGH (ref 0.44–1.00)
GFR calc Af Amer: 38 mL/min — ABNORMAL LOW (ref >60)
GFR calc non Af Amer: 33 mL/min — ABNORMAL LOW (ref >60)
Glucose, Bld: 84 mg/dL (ref 70–99)
POTASSIUM: 4.8 mmol/L (ref 3.5–5.1)
Sodium: 141 mmol/L (ref 135–145)

## 2018-03-05 LAB — ABO/RH: ABO/RH(D): O POS

## 2018-03-05 LAB — SURGICAL PCR SCREEN
MRSA, PCR: POSITIVE — AB
Staphylococcus aureus: POSITIVE — AB

## 2018-03-05 NOTE — Progress Notes (Signed)
03-05-18 PCR result routed to Dr. Alvan Dame for review.
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
## 2018-03-07 NOTE — Anesthesia Preprocedure Evaluation (Addendum)
Anesthesia Evaluation  Patient identified by MRN, date of birth, ID band Patient awake    Reviewed: Allergy & Precautions, NPO status , Patient's Chart, lab work & pertinent test results  History of Anesthesia Complications (+) PONV  Airway Mallampati: I  TM Distance: >3 FB Neck ROM: Full    Dental  (+) Upper Dentures   Pulmonary sleep apnea , Current Smoker,    breath sounds clear to auscultation       Cardiovascular hypertension, Pt. on medications and Pt. on home beta blockers + Cardiac Stents   Rhythm:Regular Rate:Normal     Neuro/Psych  Headaches, Anxiety Depression CVA    GI/Hepatic Neg liver ROS, hiatal hernia, GERD  Medicated,  Endo/Other  negative endocrine ROS  Renal/GU negative Renal ROS     Musculoskeletal  (+) Arthritis , Osteoarthritis,    Abdominal Normal abdominal exam  (+)   Peds  Hematology negative hematology ROS (+)   Anesthesia Other Findings   Reproductive/Obstetrics                           Anesthesia Physical Anesthesia Plan  ASA: III  Anesthesia Plan: Spinal   Post-op Pain Management:    Induction: Intravenous  PONV Risk Score and Plan: 3 and Ondansetron, Dexamethasone and Treatment may vary due to age or medical condition  Airway Management Planned: Natural Airway and Simple Face Mask  Additional Equipment: None  Intra-op Plan:   Post-operative Plan:   Informed Consent: I have reviewed the patients History and Physical, chart, labs and discussed the procedure including the risks, benefits and alternatives for the proposed anesthesia with the patient or authorized representative who has indicated his/her understanding and acceptance.     Dental advisory given  Plan Discussed with: CRNA  Anesthesia Plan Comments: (See PST note 03/05/2018, Konrad Felix, PA-C)       Anesthesia Quick Evaluation

## 2018-03-12 ENCOUNTER — Observation Stay (HOSPITAL_COMMUNITY)
Admission: RE | Admit: 2018-03-12 | Discharge: 2018-03-14 | Disposition: A | Discharge status: Home / Self Care | Payer: Medicare Other | Attending: Orthopedic Surgery | Admitting: Orthopedic Surgery

## 2018-03-12 ENCOUNTER — Ambulatory Visit (HOSPITAL_COMMUNITY): Payer: Medicare Other | Admitting: Registered Nurse

## 2018-03-12 ENCOUNTER — Other Ambulatory Visit: Payer: Self-pay

## 2018-03-12 ENCOUNTER — Encounter (HOSPITAL_COMMUNITY)
Admission: RE | Disposition: A | Discharge status: Home / Self Care | Payer: Self-pay | Source: Home / Self Care | Attending: Orthopedic Surgery

## 2018-03-12 ENCOUNTER — Ambulatory Visit (HOSPITAL_COMMUNITY): Payer: Medicare Other | Admitting: Physician Assistant

## 2018-03-12 ENCOUNTER — Encounter (HOSPITAL_COMMUNITY): Payer: Self-pay | Admitting: Emergency Medicine

## 2018-03-12 DIAGNOSIS — G8918 Other acute postprocedural pain: Secondary | ICD-10-CM | POA: Diagnosis not present

## 2018-03-12 DIAGNOSIS — F419 Anxiety disorder, unspecified: Secondary | ICD-10-CM | POA: Diagnosis not present

## 2018-03-12 DIAGNOSIS — K219 Gastro-esophageal reflux disease without esophagitis: Secondary | ICD-10-CM | POA: Diagnosis not present

## 2018-03-12 DIAGNOSIS — Z96652 Presence of left artificial knee joint: Secondary | ICD-10-CM

## 2018-03-12 DIAGNOSIS — Z79899 Other long term (current) drug therapy: Secondary | ICD-10-CM | POA: Diagnosis not present

## 2018-03-12 DIAGNOSIS — Z8673 Personal history of transient ischemic attack (TIA), and cerebral infarction without residual deficits: Secondary | ICD-10-CM | POA: Insufficient documentation

## 2018-03-12 DIAGNOSIS — I10 Essential (primary) hypertension: Secondary | ICD-10-CM | POA: Diagnosis not present

## 2018-03-12 DIAGNOSIS — F17210 Nicotine dependence, cigarettes, uncomplicated: Secondary | ICD-10-CM | POA: Diagnosis not present

## 2018-03-12 DIAGNOSIS — R262 Difficulty in walking, not elsewhere classified: Secondary | ICD-10-CM | POA: Insufficient documentation

## 2018-03-12 DIAGNOSIS — F329 Major depressive disorder, single episode, unspecified: Secondary | ICD-10-CM | POA: Diagnosis not present

## 2018-03-12 DIAGNOSIS — G4730 Sleep apnea, unspecified: Secondary | ICD-10-CM | POA: Insufficient documentation

## 2018-03-12 DIAGNOSIS — Z7982 Long term (current) use of aspirin: Secondary | ICD-10-CM | POA: Insufficient documentation

## 2018-03-12 DIAGNOSIS — Z955 Presence of coronary angioplasty implant and graft: Secondary | ICD-10-CM | POA: Insufficient documentation

## 2018-03-12 DIAGNOSIS — M1712 Unilateral primary osteoarthritis, left knee: Principal | ICD-10-CM | POA: Insufficient documentation

## 2018-03-12 DIAGNOSIS — Z886 Allergy status to analgesic agent status: Secondary | ICD-10-CM | POA: Diagnosis not present

## 2018-03-12 HISTORY — PX: TOTAL KNEE ARTHROPLASTY: SHX125

## 2018-03-12 LAB — TYPE AND SCREEN
ABO/RH(D): O POS
Antibody Screen: NEGATIVE

## 2018-03-12 SURGERY — ARTHROPLASTY, KNEE, TOTAL
Anesthesia: Spinal | Laterality: Left

## 2018-03-12 MED ORDER — PROPOFOL 10 MG/ML IV BOLUS
INTRAVENOUS | Status: DC | PRN
  Administered 2018-03-12 (×3): 10 mg via INTRAVENOUS

## 2018-03-12 MED ORDER — HYDROCODONE-ACETAMINOPHEN 5-325 MG PO TABS
1.0000 | ORAL_TABLET | ORAL | Status: DC | PRN
  Administered 2018-03-12: 2 via ORAL
  Filled 2018-03-12: qty 2

## 2018-03-12 MED ORDER — DEXAMETHASONE SODIUM PHOSPHATE 10 MG/ML IJ SOLN
10.0000 mg | Freq: Once | INTRAMUSCULAR | Status: AC
  Administered 2018-03-13: 10 mg via INTRAVENOUS
  Filled 2018-03-12: qty 1

## 2018-03-12 MED ORDER — CEFAZOLIN SODIUM-DEXTROSE 2-4 GM/100ML-% IV SOLN
2.0000 g | INTRAVENOUS | Status: AC
  Administered 2018-03-12: 2 g via INTRAVENOUS
  Filled 2018-03-12: qty 100

## 2018-03-12 MED ORDER — BISACODYL 10 MG RE SUPP: 10.0000 mg | Freq: Every day | RECTAL | Status: DC | PRN

## 2018-03-12 MED ORDER — HYDROCHLOROTHIAZIDE 25 MG PO TABS
25.0000 mg | ORAL_TABLET | Freq: Every day | ORAL | Status: DC
  Administered 2018-03-13 – 2018-03-14 (×2): 25 mg via ORAL
  Filled 2018-03-12 (×2): qty 1

## 2018-03-12 MED ORDER — PROMETHAZINE HCL 25 MG/ML IJ SOLN: 6.2500 mg | INTRAMUSCULAR | Status: DC | PRN

## 2018-03-12 MED ORDER — ROPIVACAINE HCL 7.5 MG/ML IJ SOLN
INTRAMUSCULAR | Status: DC | PRN
  Administered 2018-03-12: 20 mL via PERINEURAL

## 2018-03-12 MED ORDER — BUPIVACAINE-EPINEPHRINE (PF) 0.25% -1:200000 IJ SOLN
INTRAMUSCULAR | Status: AC
  Filled 2018-03-12: qty 30

## 2018-03-12 MED ORDER — VANCOMYCIN HCL IN DEXTROSE 1-5 GM/200ML-% IV SOLN
1000.0000 mg | Freq: Once | INTRAVENOUS | Status: AC
  Administered 2018-03-12: 1000 mg via INTRAVENOUS
  Filled 2018-03-12: qty 200

## 2018-03-12 MED ORDER — BUPIVACAINE-EPINEPHRINE (PF) 0.25% -1:200000 IJ SOLN
INTRAMUSCULAR | Status: DC | PRN
  Administered 2018-03-12: 30 mL

## 2018-03-12 MED ORDER — TRANEXAMIC ACID-NACL 1000-0.7 MG/100ML-% IV SOLN
1000.0000 mg | INTRAVENOUS | Status: AC
  Administered 2018-03-12: 1000 mg via INTRAVENOUS
  Filled 2018-03-12: qty 100

## 2018-03-12 MED ORDER — PANTOPRAZOLE SODIUM 40 MG PO TBEC
40.0000 mg | DELAYED_RELEASE_TABLET | Freq: Two times a day (BID) | ORAL | Status: DC
  Administered 2018-03-12 – 2018-03-14 (×4): 40 mg via ORAL
  Filled 2018-03-12 (×4): qty 1

## 2018-03-12 MED ORDER — ONDANSETRON HCL 4 MG PO TABS
4.0000 mg | ORAL_TABLET | Freq: Four times a day (QID) | ORAL | Status: DC | PRN
  Administered 2018-03-14: 4 mg via ORAL
  Filled 2018-03-12: qty 1

## 2018-03-12 MED ORDER — HYDROMORPHONE HCL 1 MG/ML IJ SOLN: 0.2500 mg | INTRAMUSCULAR | Status: DC | PRN

## 2018-03-12 MED ORDER — ONDANSETRON HCL 4 MG/2ML IJ SOLN
4.0000 mg | Freq: Four times a day (QID) | INTRAMUSCULAR | Status: DC | PRN
  Administered 2018-03-12: 4 mg via INTRAVENOUS
  Filled 2018-03-12: qty 2

## 2018-03-12 MED ORDER — DEXAMETHASONE SODIUM PHOSPHATE 10 MG/ML IJ SOLN
10.0000 mg | Freq: Once | INTRAMUSCULAR | Status: AC
  Administered 2018-03-12: 6 mg via INTRAVENOUS

## 2018-03-12 MED ORDER — MENTHOL 3 MG MT LOZG: 1.0000 | LOZENGE | OROMUCOSAL | Status: DC | PRN

## 2018-03-12 MED ORDER — FENTANYL CITRATE (PF) 100 MCG/2ML IJ SOLN
50.0000 ug | INTRAMUSCULAR | Status: AC
  Administered 2018-03-12: 25 ug via INTRAVENOUS
  Filled 2018-03-12: qty 2

## 2018-03-12 MED ORDER — PROPOFOL 10 MG/ML IV BOLUS
INTRAVENOUS | Status: AC
  Filled 2018-03-12: qty 40

## 2018-03-12 MED ORDER — METHOCARBAMOL 500 MG PO TABS
500.0000 mg | ORAL_TABLET | Freq: Four times a day (QID) | ORAL | Status: DC | PRN
  Administered 2018-03-13 – 2018-03-14 (×4): 500 mg via ORAL
  Filled 2018-03-12 (×4): qty 1

## 2018-03-12 MED ORDER — KETOROLAC TROMETHAMINE 30 MG/ML IJ SOLN
INTRAMUSCULAR | Status: DC | PRN
  Administered 2018-03-12: 30 mg via INTRA_ARTICULAR

## 2018-03-12 MED ORDER — ONDANSETRON HCL 4 MG/2ML IJ SOLN
INTRAMUSCULAR | Status: DC | PRN
  Administered 2018-03-12: 4 mg via INTRAVENOUS

## 2018-03-12 MED ORDER — LACTATED RINGERS IV SOLN: INTRAVENOUS | Status: DC

## 2018-03-12 MED ORDER — ATORVASTATIN CALCIUM 40 MG PO TABS
80.0000 mg | ORAL_TABLET | Freq: Every day | ORAL | Status: DC
  Administered 2018-03-13 – 2018-03-14 (×2): 80 mg via ORAL
  Filled 2018-03-12 (×2): qty 2

## 2018-03-12 MED ORDER — KETOROLAC TROMETHAMINE 30 MG/ML IJ SOLN
INTRAMUSCULAR | Status: AC
  Filled 2018-03-12: qty 1

## 2018-03-12 MED ORDER — POLYETHYLENE GLYCOL 3350 17 G PO PACK
17.0000 g | PACK | Freq: Two times a day (BID) | ORAL | Status: DC
  Administered 2018-03-12 – 2018-03-14 (×4): 17 g via ORAL
  Filled 2018-03-12 (×4): qty 1

## 2018-03-12 MED ORDER — POLYETHYLENE GLYCOL 3350 17 G PO PACK: 17.0000 g | PACK | Freq: Two times a day (BID) | ORAL | 0 refills | Status: DC

## 2018-03-12 MED ORDER — SODIUM CHLORIDE (PF) 0.9 % IJ SOLN
INTRAMUSCULAR | Status: AC
  Filled 2018-03-12: qty 50

## 2018-03-12 MED ORDER — FERROUS SULFATE 325 (65 FE) MG PO TABS
325.0000 mg | ORAL_TABLET | Freq: Two times a day (BID) | ORAL | Status: DC
  Administered 2018-03-13 – 2018-03-14 (×3): 325 mg via ORAL
  Filled 2018-03-12 (×3): qty 1

## 2018-03-12 MED ORDER — LACTATED RINGERS IV SOLN
INTRAVENOUS | Status: DC
  Administered 2018-03-12 (×2): via INTRAVENOUS

## 2018-03-12 MED ORDER — BUPIVACAINE IN DEXTROSE 0.75-8.25 % IT SOLN
INTRATHECAL | Status: DC | PRN
  Administered 2018-03-12: 1.6 mL via INTRATHECAL

## 2018-03-12 MED ORDER — MIDAZOLAM HCL 2 MG/2ML IJ SOLN
1.0000 mg | INTRAMUSCULAR | Status: AC
  Administered 2018-03-12: 2 mg via INTRAVENOUS
  Filled 2018-03-12: qty 2

## 2018-03-12 MED ORDER — SODIUM CHLORIDE (PF) 0.9 % IJ SOLN
INTRAMUSCULAR | Status: DC | PRN
  Administered 2018-03-12: 30 mL

## 2018-03-12 MED ORDER — MAGNESIUM CITRATE PO SOLN: 1.0000 | Freq: Once | ORAL | Status: DC | PRN

## 2018-03-12 MED ORDER — METOCLOPRAMIDE HCL 5 MG PO TABS
5.0000 mg | ORAL_TABLET | Freq: Three times a day (TID) | ORAL | Status: DC | PRN
  Administered 2018-03-14 (×2): 10 mg via ORAL
  Filled 2018-03-12 (×2): qty 2

## 2018-03-12 MED ORDER — METOPROLOL TARTRATE 50 MG PO TABS
50.0000 mg | ORAL_TABLET | Freq: Two times a day (BID) | ORAL | Status: DC
  Administered 2018-03-12 – 2018-03-14 (×4): 50 mg via ORAL
  Filled 2018-03-12 (×4): qty 1

## 2018-03-12 MED ORDER — HYDROCODONE-ACETAMINOPHEN 7.5-325 MG PO TABS
1.0000 | ORAL_TABLET | ORAL | Status: DC | PRN
  Administered 2018-03-12 – 2018-03-14 (×7): 2 via ORAL
  Administered 2018-03-14: 1 via ORAL
  Filled 2018-03-12 (×3): qty 2
  Filled 2018-03-12: qty 1
  Filled 2018-03-12 (×4): qty 2

## 2018-03-12 MED ORDER — PROPOFOL 10 MG/ML IV BOLUS
INTRAVENOUS | Status: AC
  Filled 2018-03-12: qty 60

## 2018-03-12 MED ORDER — ALUM & MAG HYDROXIDE-SIMETH 200-200-20 MG/5ML PO SUSP
15.0000 mL | ORAL | Status: DC | PRN
  Administered 2018-03-13: 15 mL via ORAL
  Filled 2018-03-12: qty 30

## 2018-03-12 MED ORDER — METHOCARBAMOL 500 MG PO TABS: 500.0000 mg | ORAL_TABLET | Freq: Four times a day (QID) | ORAL | 0 refills | Status: DC | PRN

## 2018-03-12 MED ORDER — TRANEXAMIC ACID-NACL 1000-0.7 MG/100ML-% IV SOLN
1000.0000 mg | Freq: Once | INTRAVENOUS | Status: AC
  Administered 2018-03-12: 1000 mg via INTRAVENOUS
  Filled 2018-03-12: qty 100

## 2018-03-12 MED ORDER — METHOCARBAMOL 500 MG IVPB - SIMPLE MED
INTRAVENOUS | Status: AC
  Filled 2018-03-12: qty 50

## 2018-03-12 MED ORDER — CHLORHEXIDINE GLUCONATE 4 % EX LIQD: 60.0000 mL | Freq: Once | CUTANEOUS | Status: DC

## 2018-03-12 MED ORDER — ASPIRIN 81 MG PO CHEW
81.0000 mg | CHEWABLE_TABLET | Freq: Two times a day (BID) | ORAL | Status: DC
  Administered 2018-03-12 – 2018-03-14 (×4): 81 mg via ORAL
  Filled 2018-03-12 (×4): qty 1

## 2018-03-12 MED ORDER — HYDROCODONE-ACETAMINOPHEN 7.5-325 MG PO TABS: 1.0000 | ORAL_TABLET | ORAL | 0 refills | Status: DC | PRN

## 2018-03-12 MED ORDER — MEPERIDINE HCL 50 MG/ML IJ SOLN: 6.2500 mg | INTRAMUSCULAR | Status: DC | PRN

## 2018-03-12 MED ORDER — ONDANSETRON HCL 4 MG/2ML IJ SOLN
INTRAMUSCULAR | Status: AC
  Filled 2018-03-12: qty 2

## 2018-03-12 MED ORDER — MIDAZOLAM HCL 2 MG/2ML IJ SOLN
INTRAMUSCULAR | Status: DC | PRN
  Administered 2018-03-12: 1 mg via INTRAVENOUS

## 2018-03-12 MED ORDER — PHENYLEPHRINE 40 MCG/ML (10ML) SYRINGE FOR IV PUSH (FOR BLOOD PRESSURE SUPPORT)
PREFILLED_SYRINGE | INTRAVENOUS | Status: DC | PRN
  Administered 2018-03-12: 80 ug via INTRAVENOUS
  Administered 2018-03-12: 40 ug via INTRAVENOUS

## 2018-03-12 MED ORDER — FLUOXETINE HCL 10 MG PO CAPS
10.0000 mg | ORAL_CAPSULE | Freq: Every day | ORAL | Status: DC
  Administered 2018-03-12 – 2018-03-14 (×3): 10 mg via ORAL
  Filled 2018-03-12 (×3): qty 1

## 2018-03-12 MED ORDER — PROPOFOL 10 MG/ML IV BOLUS
INTRAVENOUS | Status: AC
  Filled 2018-03-12: qty 20

## 2018-03-12 MED ORDER — PHENOL 1.4 % MT LIQD
1.0000 | OROMUCOSAL | Status: DC | PRN
  Filled 2018-03-12: qty 177

## 2018-03-12 MED ORDER — DOCUSATE SODIUM 100 MG PO CAPS
100.0000 mg | ORAL_CAPSULE | Freq: Two times a day (BID) | ORAL | Status: DC
  Administered 2018-03-12 – 2018-03-14 (×4): 100 mg via ORAL
  Filled 2018-03-12 (×4): qty 1

## 2018-03-12 MED ORDER — MIDAZOLAM HCL 2 MG/2ML IJ SOLN
INTRAMUSCULAR | Status: AC
  Filled 2018-03-12: qty 2

## 2018-03-12 MED ORDER — PROPOFOL 500 MG/50ML IV EMUL
INTRAVENOUS | Status: DC | PRN
  Administered 2018-03-12: 40 ug/kg/min via INTRAVENOUS

## 2018-03-12 MED ORDER — ASPIRIN 81 MG PO CHEW: 81.0000 mg | CHEWABLE_TABLET | Freq: Two times a day (BID) | ORAL | 0 refills | Status: AC

## 2018-03-12 MED ORDER — FENTANYL CITRATE (PF) 100 MCG/2ML IJ SOLN
25.0000 ug | INTRAMUSCULAR | Status: DC | PRN
  Administered 2018-03-13: 50 ug via INTRAVENOUS
  Administered 2018-03-14: 25 ug via INTRAVENOUS
  Administered 2018-03-14: 50 ug via INTRAVENOUS
  Filled 2018-03-12 (×3): qty 2

## 2018-03-12 MED ORDER — DEXAMETHASONE SODIUM PHOSPHATE 10 MG/ML IJ SOLN
INTRAMUSCULAR | Status: AC
  Filled 2018-03-12: qty 1

## 2018-03-12 MED ORDER — CEFAZOLIN SODIUM-DEXTROSE 2-4 GM/100ML-% IV SOLN
2.0000 g | Freq: Four times a day (QID) | INTRAVENOUS | Status: AC
  Administered 2018-03-12 – 2018-03-13 (×2): 2 g via INTRAVENOUS
  Filled 2018-03-12 (×2): qty 100

## 2018-03-12 MED ORDER — SODIUM CHLORIDE 0.9 % IV SOLN
INTRAVENOUS | Status: DC
  Administered 2018-03-12: 19:00:00 via INTRAVENOUS

## 2018-03-12 MED ORDER — PHENYLEPHRINE 40 MCG/ML (10ML) SYRINGE FOR IV PUSH (FOR BLOOD PRESSURE SUPPORT)
PREFILLED_SYRINGE | INTRAVENOUS | Status: AC
  Filled 2018-03-12: qty 10

## 2018-03-12 MED ORDER — ACETAMINOPHEN 325 MG PO TABS
325.0000 mg | ORAL_TABLET | Freq: Four times a day (QID) | ORAL | Status: DC | PRN
  Administered 2018-03-14: 650 mg via ORAL
  Filled 2018-03-12: qty 2

## 2018-03-12 MED ORDER — ALPRAZOLAM 1 MG PO TABS
1.0000 mg | ORAL_TABLET | Freq: Three times a day (TID) | ORAL | Status: DC
  Administered 2018-03-12 – 2018-03-14 (×6): 1 mg via ORAL
  Filled 2018-03-12 (×6): qty 1

## 2018-03-12 MED ORDER — DOCUSATE SODIUM 100 MG PO CAPS: 100.0000 mg | ORAL_CAPSULE | Freq: Two times a day (BID) | ORAL | 0 refills | Status: DC

## 2018-03-12 MED ORDER — FERROUS SULFATE 325 (65 FE) MG PO TABS: 325.0000 mg | ORAL_TABLET | Freq: Three times a day (TID) | ORAL | 3 refills | Status: DC

## 2018-03-12 MED ORDER — DIPHENHYDRAMINE HCL 12.5 MG/5ML PO ELIX
12.5000 mg | ORAL_SOLUTION | ORAL | Status: DC | PRN
  Administered 2018-03-13: 25 mg via ORAL
  Filled 2018-03-12: qty 10

## 2018-03-12 MED ORDER — METOCLOPRAMIDE HCL 5 MG/ML IJ SOLN: 5.0000 mg | Freq: Three times a day (TID) | INTRAMUSCULAR | Status: DC | PRN

## 2018-03-12 MED ORDER — LIDOCAINE 2% (20 MG/ML) 5 ML SYRINGE
INTRAMUSCULAR | Status: DC | PRN
  Administered 2018-03-12: 75 mg via INTRAVENOUS

## 2018-03-12 MED ORDER — METHOCARBAMOL 500 MG IVPB - SIMPLE MED
500.0000 mg | Freq: Four times a day (QID) | INTRAVENOUS | Status: DC | PRN
  Administered 2018-03-12: 500 mg via INTRAVENOUS
  Filled 2018-03-12: qty 50

## 2018-03-12 SURGICAL SUPPLY — 55 items
ATTUNE MED ANAT PAT 35 KNEE (Knees) ×1 IMPLANT
ATTUNE MED ANAT PAT 35MM KNEE (Knees) ×1 IMPLANT
ATTUNE PS FEM LT SZ 4 CEM KNEE (Femur) ×2 IMPLANT
ATTUNE PSRP INSR SZ4 7 KNEE (Insert) ×1 IMPLANT
ATTUNE PSRP INSR SZ4 7MM KNEE (Insert) ×1 IMPLANT
BAG ZIPLOCK 12X15 (MISCELLANEOUS) IMPLANT
BANDAGE ACE 6X5 VEL STRL LF (GAUZE/BANDAGES/DRESSINGS) ×3 IMPLANT
BANDAGE ELASTIC 6 VELCRO ST LF (GAUZE/BANDAGES/DRESSINGS) ×2 IMPLANT
BASEPLATE TIBIAL ROTATING SZ 4 (Knees) ×2 IMPLANT
BLADE SAW SGTL 11.0X1.19X90.0M (BLADE) IMPLANT
BLADE SAW SGTL 13.0X1.19X90.0M (BLADE) ×3 IMPLANT
BLADE SURG SZ10 CARB STEEL (BLADE) ×6 IMPLANT
BOWL SMART MIX CTS (DISPOSABLE) ×3 IMPLANT
CEMENT HV SMART SET (Cement) ×4 IMPLANT
COVER SURGICAL LIGHT HANDLE (MISCELLANEOUS) ×3 IMPLANT
COVER WAND RF STERILE (DRAPES) IMPLANT
CUFF TOURN SGL QUICK 34 (TOURNIQUET CUFF) ×2
CUFF TRNQT CYL 34X4X40X1 (TOURNIQUET CUFF) ×1 IMPLANT
DECANTER SPIKE VIAL GLASS SM (MISCELLANEOUS) ×6 IMPLANT
DERMABOND ADVANCED (GAUZE/BANDAGES/DRESSINGS) ×2
DERMABOND ADVANCED .7 DNX12 (GAUZE/BANDAGES/DRESSINGS) ×1 IMPLANT
DRAPE U-SHAPE 47X51 STRL (DRAPES) ×3 IMPLANT
DRESSING AQUACEL AG SP 3.5X10 (GAUZE/BANDAGES/DRESSINGS) ×1 IMPLANT
DRSG AQUACEL AG ADV 3.5X10 (GAUZE/BANDAGES/DRESSINGS) ×2 IMPLANT
DRSG AQUACEL AG SP 3.5X10 (GAUZE/BANDAGES/DRESSINGS) ×3
DURAPREP 26ML APPLICATOR (WOUND CARE) ×6 IMPLANT
ELECT REM PT RETURN 15FT ADLT (MISCELLANEOUS) ×3 IMPLANT
GLOVE BIOGEL M 7.0 STRL (GLOVE) IMPLANT
GLOVE BIOGEL PI IND STRL 7.5 (GLOVE) ×1 IMPLANT
GLOVE BIOGEL PI IND STRL 8.5 (GLOVE) ×1 IMPLANT
GLOVE BIOGEL PI INDICATOR 7.5 (GLOVE) ×2
GLOVE BIOGEL PI INDICATOR 8.5 (GLOVE) ×2
GLOVE ECLIPSE 8.0 STRL XLNG CF (GLOVE) ×3 IMPLANT
GLOVE ORTHO TXT STRL SZ7.5 (GLOVE) ×6 IMPLANT
GOWN STRL REUS W/TWL 2XL LVL3 (GOWN DISPOSABLE) ×3 IMPLANT
GOWN STRL REUS W/TWL LRG LVL3 (GOWN DISPOSABLE) ×3 IMPLANT
HANDPIECE INTERPULSE COAX TIP (DISPOSABLE) ×2
HOLDER FOLEY CATH W/STRAP (MISCELLANEOUS) IMPLANT
MANIFOLD NEPTUNE II (INSTRUMENTS) ×3 IMPLANT
NDL SAFETY ECLIPSE 18X1.5 (NEEDLE) IMPLANT
NEEDLE HYPO 18GX1.5 SHARP (NEEDLE)
PACK TOTAL KNEE CUSTOM (KITS) ×3 IMPLANT
PIN THREADED HEADED SIGMA (PIN) ×2 IMPLANT
PROTECTOR NERVE ULNAR (MISCELLANEOUS) ×3 IMPLANT
SET HNDPC FAN SPRY TIP SCT (DISPOSABLE) ×1 IMPLANT
SET PAD KNEE POSITIONER (MISCELLANEOUS) ×3 IMPLANT
SUT MNCRL AB 4-0 PS2 18 (SUTURE) ×3 IMPLANT
SUT STRATAFIX PDS+ 0 24IN (SUTURE) ×3 IMPLANT
SUT VIC AB 1 CT1 36 (SUTURE) ×3 IMPLANT
SUT VIC AB 2-0 CT1 27 (SUTURE) ×6
SUT VIC AB 2-0 CT1 TAPERPNT 27 (SUTURE) ×3 IMPLANT
TRAY FOLEY MTR SLVR 16FR STAT (SET/KITS/TRAYS/PACK) ×3 IMPLANT
WATER STERILE IRR 1000ML POUR (IV SOLUTION) ×3 IMPLANT
WRAP KNEE MAXI GEL POST OP (GAUZE/BANDAGES/DRESSINGS) ×3 IMPLANT
YANKAUER SUCT BULB TIP 10FT TU (MISCELLANEOUS) ×3 IMPLANT

## 2018-03-12 NOTE — Progress Notes (Signed)
Assisted Dr. Hollis with left, ultrasound guided, adductor canal block. Side rails up, monitors on throughout procedure. See vital signs in flow sheet. Tolerated Procedure well.  

## 2018-03-12 NOTE — Transfer of Care (Signed)
Immediate Anesthesia Transfer of Care Note  Patient: Jaclyn Hart  Procedure(s) Performed: TOTAL KNEE ARTHROPLASTY (Left )  Patient Location: PACU  Anesthesia Type:Spinal  Level of Consciousness: awake, alert , oriented and patient cooperative  Airway & Oxygen Therapy: Patient Spontanous Breathing and Patient connected to face mask oxygen  Post-op Assessment: Report given to RN and Post -op Vital signs reviewed and stable  Post vital signs: Reviewed and stable  Last Vitals:  Vitals Value Taken Time  BP 147/79 03/12/2018  4:24 PM  Temp    Pulse 61 03/12/2018  4:26 PM  Resp 25 03/12/2018  4:26 PM  SpO2 100 % 03/12/2018  4:26 PM  Vitals shown include unvalidated device data.  Last Pain:  Vitals:   03/12/18 1209  TempSrc:   PainSc: 8       Patients Stated Pain Goal: 4 (09/98/33 8250)  Complications: No apparent anesthesia complications

## 2018-03-12 NOTE — Discharge Instructions (Signed)

## 2018-03-12 NOTE — Op Note (Signed)
NAME:  Jaclyn Hart                      MEDICAL RECORD NO.:  [id number]                             FACILITY:  [facility]      PHYSICIAN:  Pietro Cassis. Alvan Dame, M.D.  DATE OF BIRTH:  Nov 26, 1950      DATE OF PROCEDURE:  03/12/2018                                     OPERATIVE REPORT         PREOPERATIVE DIAGNOSIS:  Left knee osteoarthritis.      POSTOPERATIVE DIAGNOSIS:  Left knee osteoarthritis.      FINDINGS:  The patient was noted to have complete loss of cartilage and   bone-on-bone arthritis with associated osteophytes in the medial and patellofemoral compartments of   the knee with a significant synovitis and associated effusion.  The patient had failed months of conservative treatment including medications, injection therapy, activity modification.     PROCEDURE:  Left total knee replacement.      COMPONENTS USED:  DePuy Attune rotating platform posterior stabilized knee   system, a size 4 femur, 4 tibia, size 7 mm PS AOX insert, and 35 anatomic patellar   button.      SURGEON:  Pietro Cassis. Alvan Dame, M.D.      ASSISTANT:  Nehemiah Massed, PA-C.      ANESTHESIA:  Regional and Spinal.      SPECIMENS:  None.      COMPLICATION:  None.      DRAINS:  None.  EBL: <100cc      TOURNIQUET TIME:   Total Tourniquet Time Documented: Thigh (Left) - 27 minutes Total: Thigh (Left) - 27 minutes  .      The patient was stable to the recovery room.      INDICATION FOR PROCEDURE:  Jaclyn Hart is a 68 y.o. female patient of   mine.  The patient had been seen, evaluated, and treated for months conservatively in the   office with medication, activity modification, and injections.  The patient had   radiographic changes of bone-on-bone arthritis with endplate sclerosis and osteophytes noted.  Based on the radiographic changes and failed conservative measures, the patient   decided to proceed with definitive treatment, total knee replacement.  Risks of infection, DVT, component failure, need  for revision surgery, neurovascular injury were reviewed in the office setting.  The postop course was reviewed stressing the efforts to maximize post-operative satisfaction and function.  Consent was obtained for benefit of pain   relief.      PROCEDURE IN DETAIL:  The patient was brought to the operative theater.   Once adequate anesthesia, preoperative antibiotics, 2 gm of Ancef, 1 gm of Vancomycin (MRSA positive),1 gm of Tranexamic Acid, and 10 mg of Decadron administered, the patient was positioned supine with a left thigh tourniquet placed.  The  left lower extremity was prepped and draped in sterile fashion.  A time-   out was performed identifying the patient, planned procedure, and the appropriate extremity.      The left lower extremity was placed in the [facility] leg holder.  The leg was   exsanguinated, tourniquet elevated to 250 mmHg.  A  midline incision was   made followed by median parapatellar arthrotomy.  Following initial   exposure, attention was first directed to the patella.  Precut   measurement was noted to be 23 mm.  I resected down to 14 mm and used a   35 anatomic patellar button to restore patellar height as well as cover the cut surface.      The lug holes were drilled and a metal shim was placed to protect the   patella from retractors and saw blade during the procedure.      At this point, attention was now directed to the femur.  The femoral   canal was opened with a drill, irrigated to try to prevent fat emboli.  An   intramedullary rod was passed at 3 degrees valgus, 9 mm of bone was   resected off the distal femur.  Following this resection, the tibia was   subluxated anteriorly.  Using the extramedullary guide, 2 mm of bone was resected off   the proximal medial tibia.  We confirmed the gap would be   stable medially and laterally with a size 5 spacer block as well as confirmed that the tibial cut was perpendicular in the coronal plane, checking with an  alignment rod.      Once this was done, I sized the femur to be a size 4 in the anterior-   posterior dimension, chose a standard component based on medial and   lateral dimension.  The size 4 rotation block was then pinned in   position anterior referenced using the C-clamp to set rotation.  The   anterior, posterior, and  chamfer cuts were made without difficulty nor   notching making certain that I was along the anterior cortex to help   with flexion gap stability.      The final box cut was made off the lateral aspect of distal femur.      At this point, the tibia was sized to be a size 4.  The size 4 tray was   then pinned in position through the medial third of the tubercle,   drilled, and keel punched.  Trial reduction was now carried with a 4 femur,  4 tibia, a size 6 then 7 mm PS insert, and the 35 anatomic patella botton.  The knee was brought to full extension with good flexion stability with the patella   tracking through the trochlea without application of pressure.  Given   all these findings the trial components removed.  Final components were   opened and cement was mixed.  The knee was irrigated with normal saline solution and pulse lavage.  The synovial lining was   then injected with 30 cc of 0.25% Marcaine with epinephrine, 1 cc of Toradol and 30 cc of NS for a total of 61 cc.     Final implants were then cemented onto cleaned and dried cut surfaces of bone with the knee brought to extension with a size 7 mm PS trial insert.      Once the cement had fully cured, excess cement was removed   throughout the knee.  I confirmed that I was satisfied with the range of   motion and stability, and the final size 7 mm PS AOX insert was chosen.  It was   placed into the knee.      The tourniquet had been let down at 27 minutes.  No significant   hemostasis was required.  The extensor  mechanism was then reapproximated using #1 Vicryl and #1 Stratafix sutures with the knee   in  flexion.  The   remaining wound was closed with 2-0 Vicryl and running 4-0 Monocryl.   The knee was cleaned, dried, dressed sterilely using Dermabond and   Aquacel dressing.  The patient was then   brought to recovery room in stable condition, tolerating the procedure   well.   Please note that Physician Assistant, Nehemiah Massed, PA-C was present for the entirety of the case, and was utilized for pre-operative positioning, peri-operative retractor management, general facilitation of the procedure and for primary wound closure at the end of the case.              Pietro Cassis Alvan Dame, M.D.    03/12/2018 3:42 PM

## 2018-03-12 NOTE — Anesthesia Procedure Notes (Signed)
Spinal  Start time: 03/12/2018 2:17 PM End time: 03/12/2018 2:19 PM Staffing Anesthesiologist: Effie Berkshire, MD Performed: anesthesiologist  Preanesthetic Checklist Completed: patient identified, site marked, surgical consent, pre-op evaluation, timeout performed, IV checked, risks and benefits discussed and monitors and equipment checked Spinal Block Patient position: sitting Prep: site prepped and draped and DuraPrep Location: L3-4 Injection technique: single-shot Needle Needle type: Pencan  Needle gauge: 24 G Needle length: 10 cm Needle insertion depth: 10 cm Additional Notes Patient tolerated well. No immediate complications.

## 2018-03-12 NOTE — Interval H&P Note (Signed)
History and Physical Interval Note:  03/12/2018 12:55 PM  Jaclyn Hart  has presented today for surgery, with the diagnosis of Left knee osteoarthritis  The various methods of treatment have been discussed with the patient and family. After consideration of risks, benefits and other options for treatment, the patient has consented to  Procedure(s) with comments: TOTAL KNEE ARTHROPLASTY (Left) - 70 mins as a surgical intervention .  The patient's history has been reviewed, patient examined, no change in status, stable for surgery.  I have reviewed the patient's chart and labs.  Questions were answered to the patient's satisfaction.     Mauri Pole

## 2018-03-12 NOTE — Anesthesia Procedure Notes (Signed)
Anesthesia Regional Block: Adductor canal block   Pre-Anesthetic Checklist: ,, timeout performed, Correct Patient, Correct Site, Correct Laterality, Correct Procedure, Correct Position, site marked, Risks and benefits discussed,  Surgical consent,  Pre-op evaluation,  At surgeon's request and post-op pain management  Laterality: Left  Prep: chloraprep       Needles:  Injection technique: Single-shot  Needle Type: Echogenic Stimulator Needle     Needle Length: 9cm  Needle Gauge: 21     Additional Needles:   Procedures:,,,, ultrasound used (permanent image in chart),,,,  Narrative:  Start time: 03/12/2018 1:55 PM End time: 03/12/2018 2:05 PM Injection made incrementally with aspirations every 5 mL.  Performed by: Personally  Anesthesiologist: Effie Berkshire, MD  Additional Notes: Patient tolerated the procedure well. Local anesthetic introduced in an incremental fashion under minimal resistance after negative aspirations. No paresthesias were elicited. After completion of the procedure, no acute issues were identified and patient continued to be monitored by RN.

## 2018-03-12 NOTE — Anesthesia Procedure Notes (Signed)
Date/Time: 03/12/2018 2:10 PM Performed by: Eben Burow, CRNA Pre-anesthesia Checklist: Patient identified, Emergency Drugs available, Suction available, Patient being monitored and Timeout performed Oxygen Delivery Method: Simple face mask

## 2018-03-13 DIAGNOSIS — K219 Gastro-esophageal reflux disease without esophagitis: Secondary | ICD-10-CM | POA: Diagnosis not present

## 2018-03-13 DIAGNOSIS — I10 Essential (primary) hypertension: Secondary | ICD-10-CM | POA: Diagnosis not present

## 2018-03-13 DIAGNOSIS — Z7982 Long term (current) use of aspirin: Secondary | ICD-10-CM | POA: Diagnosis not present

## 2018-03-13 DIAGNOSIS — Z955 Presence of coronary angioplasty implant and graft: Secondary | ICD-10-CM | POA: Diagnosis not present

## 2018-03-13 DIAGNOSIS — R262 Difficulty in walking, not elsewhere classified: Secondary | ICD-10-CM | POA: Diagnosis not present

## 2018-03-13 DIAGNOSIS — Z8673 Personal history of transient ischemic attack (TIA), and cerebral infarction without residual deficits: Secondary | ICD-10-CM | POA: Diagnosis not present

## 2018-03-13 DIAGNOSIS — M1712 Unilateral primary osteoarthritis, left knee: Secondary | ICD-10-CM | POA: Diagnosis not present

## 2018-03-13 DIAGNOSIS — Z79899 Other long term (current) drug therapy: Secondary | ICD-10-CM | POA: Diagnosis not present

## 2018-03-13 DIAGNOSIS — G4730 Sleep apnea, unspecified: Secondary | ICD-10-CM | POA: Diagnosis not present

## 2018-03-13 DIAGNOSIS — Z886 Allergy status to analgesic agent status: Secondary | ICD-10-CM | POA: Diagnosis not present

## 2018-03-13 LAB — BASIC METABOLIC PANEL
Anion gap: 6 (ref 5–15)
BUN: 30 mg/dL — ABNORMAL HIGH (ref 8–23)
CALCIUM: 8.3 mg/dL — AB (ref 8.9–10.3)
CO2: 23 mmol/L (ref 22–32)
Chloride: 108 mmol/L (ref 98–111)
Creatinine, Ser: 0.95 mg/dL (ref 0.44–1.00)
GFR calc Af Amer: >60 mL/min (ref >60)
Glucose, Bld: 110 mg/dL — ABNORMAL HIGH (ref 70–99)
Potassium: 4.3 mmol/L (ref 3.5–5.1)
Sodium: 137 mmol/L (ref 135–145)

## 2018-03-13 LAB — CBC
HCT: 31.4 % — ABNORMAL LOW (ref 36.0–46.0)
Hemoglobin: 9.2 g/dL — ABNORMAL LOW (ref 12.0–15.0)
MCH: 30.1 pg (ref 26.0–34.0)
MCHC: 29.3 g/dL — ABNORMAL LOW (ref 30.0–36.0)
MCV: 102.6 fL — ABNORMAL HIGH (ref 80.0–100.0)
PLATELETS: 206 10*3/uL (ref 150–400)
RBC: 3.06 MIL/uL — ABNORMAL LOW (ref 3.87–5.11)
RDW: 15.3 % (ref 11.5–15.5)
WBC: 9.5 10*3/uL (ref 4.0–10.5)
nRBC: 0 % (ref 0.0–0.2)

## 2018-03-13 MED ORDER — BUTALBITAL-APAP-CAFFEINE 50-325-40 MG PO TABS
1.0000 | ORAL_TABLET | Freq: Three times a day (TID) | ORAL | Status: DC | PRN
  Administered 2018-03-13 – 2018-03-14 (×4): 1 via ORAL
  Filled 2018-03-13 (×4): qty 1

## 2018-03-13 NOTE — Plan of Care (Signed)
Plan of care reviewed and discussed with the patient. 

## 2018-03-13 NOTE — Evaluation (Signed)
Physical Therapy Evaluation Patient Details Name: Jaclyn Hart MRN: [id number] DOB: 03-Jun-1950 Today's Date: 03/13/2018   History of Present Illness  Pt s/p L TKR and with hx of CVA and R THR (1970)  Clinical Impression  Pt s/p L TKR and presents with with decreased L LE strength/ROM and post op pain limiting functional mobility.  Pt should progress to dc to daughter's home and plans to initiate OP PT 03/15/18.    Follow Up Recommendations Follow surgeon's recommendation for DC plan and follow-up therapies    Equipment Recommendations  Rolling walker with 5" wheels(Youth level RW)    Recommendations for Other Services       Precautions / Restrictions Precautions Precautions: Fall Restrictions Weight Bearing Restrictions: No      Mobility  Bed Mobility Overal bed mobility: Needs Assistance Bed Mobility: Supine to Sit     Supine to sit: Min assist     General bed mobility comments: cues for sequence and use of R LE to self assist  Transfers Overall transfer level: Needs assistance Equipment used: Rolling walker (2 wheeled) Transfers: Sit to/from Stand Sit to Stand: Min assist         General transfer comment: cues for LE management and use of UEs to self assist  Ambulation/Gait Ambulation/Gait assistance: Min assist Gait Distance (Feet): 28 Feet Assistive device: Rolling walker (2 wheeled) Gait Pattern/deviations: Step-to pattern;Decreased step length - right;Decreased step length - left;Shuffle;Trunk flexed Gait velocity: decr   General Gait Details: cues for sequence, posture and position from RW - ltd WB tolerance with distance ltd by c/o pain  Stairs            Wheelchair Mobility    Modified Rankin (Stroke Patients Only)       Balance Overall balance assessment: Mild deficits observed, not formally tested                                           Pertinent Vitals/Pain Pain Assessment: 0-10 Pain Score: 9  Pain  Location: L knee with activity Pain Descriptors / Indicators: Aching;Sore Pain Intervention(s): Monitored during session;Limited activity within patient's tolerance;Premedicated before session;Ice applied    Home Living Family/patient expects to be discharged to:: Private residence Living Arrangements: Alone Available Help at Discharge: Family;Available 24 hours/day Type of Home: House Home Access: Stairs to enter     Home Layout: One level Home Equipment: Walker - 4 wheels      Prior Function Level of Independence: Independent;Independent with assistive device(s)               Hand Dominance        Extremity/Trunk Assessment   Upper Extremity Assessment Upper Extremity Assessment: Overall WFL for tasks assessed    Lower Extremity Assessment Lower Extremity Assessment: LLE deficits/detail LLE Deficits / Details: 3/5 quads with IND SLR; AAROM L knee -8 - 90       Communication   Communication: No difficulties  Cognition Arousal/Alertness: Awake/alert Behavior During Therapy: WFL for tasks assessed/performed Overall Cognitive Status: Within Functional Limits for tasks assessed                                        General Comments      Exercises Total Joint Exercises Ankle Circles/Pumps: AROM;Both;15 reps;Supine Quad Sets:  AROM;Both;10 reps;Supine Heel Slides: AAROM;Left;Supine;15 reps Straight Leg Raises: AAROM;AROM;Left;10 reps;Supine   Assessment/Plan    PT Assessment Patient needs continued PT services  PT Problem List Decreased strength;Decreased range of motion;Decreased activity tolerance;Decreased mobility;Decreased knowledge of use of DME;Pain       PT Treatment Interventions DME instruction;Gait training;Stair training;Functional mobility training;Therapeutic activities;Therapeutic exercise;Patient/family education    PT Goals (Current goals can be found in the Care Plan section)  Acute Rehab PT Goals Patient Stated Goal:  Not walk with a limp PT Goal Formulation: With patient Time For Goal Achievement: 03/20/18 Potential to Achieve Goals: Good    Frequency 7X/week   Barriers to discharge        Co-evaluation               AM-PAC PT "6 Clicks" Mobility  Outcome Measure Help needed turning from your back to your side while in a flat bed without using bedrails?: A Little Help needed moving from lying on your back to sitting on the side of a flat bed without using bedrails?: A Little Help needed moving to and from a bed to a chair (including a wheelchair)?: A Little Help needed standing up from a chair using your arms (e.g., wheelchair or bedside chair)?: A Little Help needed to walk in hospital room?: A Little Help needed climbing 3-5 steps with a railing? : A Little 6 Click Score: 18    End of Session Equipment Utilized During Treatment: Gait belt Activity Tolerance: Patient tolerated treatment well;Patient limited by pain Patient left: in chair;with call bell/phone within reach;with chair alarm set Nurse Communication: Mobility status PT Visit Diagnosis: Difficulty in walking, not elsewhere classified (R26.2)    Time: 7893-8101 PT Time Calculation (min) (ACUTE ONLY): 29 min   Charges:   PT Evaluation $PT Eval Low Complexity: 1 Low PT Treatments $Therapeutic Exercise: 8-22 mins        Debe Coder PT Acute Rehabilitation Services Pager [phone] Office [phone]   Afsa Meany 03/13/2018, 12:38 PM

## 2018-03-13 NOTE — Care Management Obs Status (Signed)
[place] NOTIFICATION   Patient Details  Name: Jaclyn Hart MRN: [id number] Date of Birth: Jun 05, 1950   Medicare Observation Status Notification Given:  Yes    Guadalupe Maple, RN 03/13/2018, 2:32 PM

## 2018-03-13 NOTE — Progress Notes (Signed)
Physical Therapy Treatment Patient Details Name: Jaclyn Hart MRN: [id number] DOB: 1950-09-08 Today's Date: 03/13/2018    History of Present Illness Pt s/p L TKR and with hx of CVA and R THR (1970)    PT Comments     Pt continues very motivated and progressing with mobility but limited by c/o pain with activity  Follow Up Recommendations  Follow surgeon's recommendation for DC plan and follow-up therapies     Equipment Recommendations  None recommended by PT    Recommendations for Other Services       Precautions / Restrictions Precautions Precautions: Fall Restrictions Weight Bearing Restrictions: No    Mobility  Bed Mobility Overal bed mobility: Needs Assistance Bed Mobility: Supine to Sit     Supine to sit: Min guard     General bed mobility comments: cues for sequence and use of R LE to self assist  Transfers Overall transfer level: Needs assistance Equipment used: Rolling walker (2 wheeled) Transfers: Sit to/from Stand Sit to Stand: Min guard         General transfer comment: cues for LE management and use of UEs to self assist  Ambulation/Gait Ambulation/Gait assistance: Min assist;Min guard Gait Distance (Feet): 84 Feet Assistive device: Rolling walker (2 wheeled) Gait Pattern/deviations: Step-to pattern;Decreased step length - right;Decreased step length - left;Shuffle;Trunk flexed Gait velocity: decr   General Gait Details: cues for sequence, posture and position from [employer]             Wheelchair Mobility    Modified Rankin (Stroke Patients Only)       Balance Overall balance assessment: Mild deficits observed, not formally tested                                          Cognition Arousal/Alertness: Awake/alert Behavior During Therapy: WFL for tasks assessed/performed Overall Cognitive Status: Within Functional Limits for tasks assessed                                         Exercises      General Comments        Pertinent Vitals/Pain Pain Assessment: 0-10 Pain Score: 8  Pain Location: L knee with activity Pain Descriptors / Indicators: Aching;Sore Pain Intervention(s): Limited activity within patient's tolerance;Monitored during session;Premedicated before session;Ice applied    Home Living                      Prior Function            PT Goals (current goals can now be found in the care plan section) Acute Rehab PT Goals Patient Stated Goal: Not walk with a limp PT Goal Formulation: With patient Time For Goal Achievement: 03/20/18 Potential to Achieve Goals: Good Progress towards PT goals: Progressing toward goals    Frequency    7X/week      PT Plan Current plan remains appropriate    Co-evaluation              AM-PAC PT "6 Clicks" Mobility   Outcome Measure  Help needed turning from your back to your side while in a flat bed without using bedrails?: A Little Help needed moving from lying on your back to sitting on the side of a  flat bed without using bedrails?: A Little Help needed moving to and from a bed to a chair (including a wheelchair)?: A Little Help needed standing up from a chair using your arms (e.g., wheelchair or bedside chair)?: A Little Help needed to walk in hospital room?: A Little Help needed climbing 3-5 steps with a railing? : A Little 6 Click Score: 18    End of Session Equipment Utilized During Treatment: Gait belt Activity Tolerance: Patient tolerated treatment well;Patient limited by pain Patient left: in chair;with call bell/phone within reach;with chair alarm set Nurse Communication: Mobility status PT Visit Diagnosis: Difficulty in walking, not elsewhere classified (R26.2)     Time: 3086-5784 PT Time Calculation (min) (ACUTE ONLY): 18 min  Charges:  $Gait Training: 8-22 mins                     [place] Pager [phone] Office  [phone]    Buel Molder 03/13/2018, 5:07 PM

## 2018-03-13 NOTE — Progress Notes (Signed)
     Subjective: 1 Day Post-Op Procedure(s) (LRB): TOTAL KNEE ARTHROPLASTY (Left)   Patient reports pain as mild, pain controlled.  Progressed slowly with PT.  No events throughout the night. Plan for discharge tomorrow due to pain control and need for inpatient therapy to meet goal of being discharged home safely with family/caregiver.   Anticipated LOS equal to or greater than 2 midnights due to - Age 69 and older with one or more of the following:  - Overweight  - Expected need for hospital services (PT, OT, Nursing) required for safe  discharge  - Active co-morbidities: Coronary Artery Disease   Objective:   VITALS:   Vitals:   03/13/18 0116 03/13/18 0524  BP: 140/90 (!) 155/68  Pulse: 68 (!) 59  Resp: 14 16  Temp: 97.6 F (36.4 C) 98.4 F (36.9 C)  SpO2: 95% 100%    Dorsiflexion/Plantar flexion intact Incision: dressing C/D/I No cellulitis present Compartment soft  LABS Recent Labs    03/13/18 0358  HGB 9.2*  HCT 31.4*  WBC 9.5  PLT 206    Recent Labs    03/13/18 0358  NA 137  K 4.3  BUN 30*  CREATININE 0.95  GLUCOSE 110*     Assessment/Plan: 1 Day Post-Op Procedure(s) (LRB): TOTAL KNEE ARTHROPLASTY (Left) Foley cath d/c'ed Advance diet Up with therapy D/C IV fluids Discharge home eventually when ready, possibly tomorrow   Overweight (BMI 25-29.9) Estimated body mass index is 25.5 kg/m as calculated from the following:   Height as of this encounter: 5\' 2"  (1.575 m).   Weight as of this encounter: 63.2 kg. Patient also counseled that weight may inhibit the healing process Patient counseled that losing weight will help with future health issues      [place] Pugh. Mariany Mackintosh   PAC  03/13/2018, 9:45 AM

## 2018-03-14 ENCOUNTER — Encounter (HOSPITAL_COMMUNITY): Payer: Self-pay | Admitting: Orthopedic Surgery

## 2018-03-14 DIAGNOSIS — Z955 Presence of coronary angioplasty implant and graft: Secondary | ICD-10-CM | POA: Diagnosis not present

## 2018-03-14 DIAGNOSIS — Z79899 Other long term (current) drug therapy: Secondary | ICD-10-CM | POA: Diagnosis not present

## 2018-03-14 DIAGNOSIS — G4730 Sleep apnea, unspecified: Secondary | ICD-10-CM | POA: Diagnosis not present

## 2018-03-14 DIAGNOSIS — M1712 Unilateral primary osteoarthritis, left knee: Secondary | ICD-10-CM | POA: Diagnosis not present

## 2018-03-14 DIAGNOSIS — I10 Essential (primary) hypertension: Secondary | ICD-10-CM | POA: Diagnosis not present

## 2018-03-14 DIAGNOSIS — K219 Gastro-esophageal reflux disease without esophagitis: Secondary | ICD-10-CM | POA: Diagnosis not present

## 2018-03-14 DIAGNOSIS — Z7982 Long term (current) use of aspirin: Secondary | ICD-10-CM | POA: Diagnosis not present

## 2018-03-14 DIAGNOSIS — Z886 Allergy status to analgesic agent status: Secondary | ICD-10-CM | POA: Diagnosis not present

## 2018-03-14 DIAGNOSIS — R262 Difficulty in walking, not elsewhere classified: Secondary | ICD-10-CM | POA: Diagnosis not present

## 2018-03-14 DIAGNOSIS — Z8673 Personal history of transient ischemic attack (TIA), and cerebral infarction without residual deficits: Secondary | ICD-10-CM | POA: Diagnosis not present

## 2018-03-14 LAB — BASIC METABOLIC PANEL
Anion gap: 7 (ref 5–15)
BUN: 28 mg/dL — AB (ref 8–23)
CO2: 20 mmol/L — ABNORMAL LOW (ref 22–32)
Calcium: 8.9 mg/dL (ref 8.9–10.3)
Chloride: 107 mmol/L (ref 98–111)
Creatinine, Ser: 0.97 mg/dL (ref 0.44–1.00)
GFR calc Af Amer: >60 mL/min (ref >60)
GFR calc non Af Amer: >60 mL/min (ref >60)
Glucose, Bld: 114 mg/dL — ABNORMAL HIGH (ref 70–99)
Potassium: 5.1 mmol/L (ref 3.5–5.1)
Sodium: 134 mmol/L — ABNORMAL LOW (ref 135–145)

## 2018-03-14 LAB — CBC
HCT: 33.5 % — ABNORMAL LOW (ref 36.0–46.0)
HEMOGLOBIN: 9.9 g/dL — AB (ref 12.0–15.0)
MCH: 31.3 pg (ref 26.0–34.0)
MCHC: 29.6 g/dL — ABNORMAL LOW (ref 30.0–36.0)
MCV: 106 fL — ABNORMAL HIGH (ref 80.0–100.0)
Platelets: 206 10*3/uL (ref 150–400)
RBC: 3.16 MIL/uL — ABNORMAL LOW (ref 3.87–5.11)
RDW: 15.2 % (ref 11.5–15.5)
WBC: 10.6 10*3/uL — ABNORMAL HIGH (ref 4.0–10.5)
nRBC: 0 % (ref 0.0–0.2)

## 2018-03-14 NOTE — Progress Notes (Signed)
Physical Therapy Treatment Patient Details Name: Jaclyn Hart MRN: [id number] DOB: 1950/09/16 Today's Date: 03/14/2018    History of Present Illness Pt s/p L TKR and with hx of CVA and R THR (1970)    PT Comments    Pt performed therex program with assist.  OOB deferred - pt just back to bed.  Will follow up in pm with ambulation and stair training.   Follow Up Recommendations  Follow surgeon's recommendation for DC plan and follow-up therapies     Equipment Recommendations  None recommended by PT    Recommendations for Other Services       Precautions / Restrictions Precautions Precautions: Fall Restrictions Weight Bearing Restrictions: No    Mobility  Bed Mobility               General bed mobility comments: OOB deferred at pt request - just back to bed after trip to bathroom  Transfers                    Ambulation/Gait                 Stairs             Wheelchair Mobility    Modified Rankin (Stroke Patients Only)       Balance                                            Cognition Arousal/Alertness: Awake/alert Behavior During Therapy: WFL for tasks assessed/performed;Impulsive Overall Cognitive Status: Within Functional Limits for tasks assessed                                        Exercises Total Joint Exercises Ankle Circles/Pumps: AROM;Both;15 reps;Supine Quad Sets: AROM;Both;Supine;15 reps Heel Slides: AAROM;Left;Supine;15 reps Straight Leg Raises: AAROM;AROM;Left;Supine;15 reps Goniometric ROM: AAROM R knee -5 - 75    General Comments        Pertinent Vitals/Pain Pain Assessment: 0-10 Pain Score: 8  Pain Location: L knee with activity Pain Descriptors / Indicators: Aching;Sore Pain Intervention(s): Limited activity within patient's tolerance;Monitored during session;Premedicated before session;Ice applied    Home Living                      Prior Function             PT Goals (current goals can now be found in the care plan section) Acute Rehab PT Goals Patient Stated Goal: Less pain PT Goal Formulation: With patient Time For Goal Achievement: 03/20/18 Potential to Achieve Goals: Good Progress towards PT goals: Progressing toward goals    Frequency    7X/week      PT Plan Current plan remains appropriate    Co-evaluation              AM-PAC PT "6 Clicks" Mobility   Outcome Measure  Help needed turning from your back to your side while in a flat bed without using bedrails?: A Little Help needed moving from lying on your back to sitting on the side of a flat bed without using bedrails?: A Little Help needed moving to and from a bed to a chair (including a wheelchair)?: A Little Help needed standing up from a chair using your arms (e.g., wheelchair  or bedside chair)?: A Little Help needed to walk in hospital room?: A Little Help needed climbing 3-5 steps with a railing? : A Little 6 Click Score: 18    End of Session Equipment Utilized During Treatment: Gait belt Activity Tolerance: Patient limited by pain Patient left: with call bell/phone within reach;in bed Nurse Communication: Mobility status PT Visit Diagnosis: Difficulty in walking, not elsewhere classified (R26.2)     Time: 4492-5241 PT Time Calculation (min) (ACUTE ONLY): 19 min  Charges:  $Therapeutic Exercise: 8-22 mins                     [place] Pager [phone] Office [phone]    Jaaron Oleson 03/14/2018, 12:20 PM

## 2018-03-14 NOTE — Progress Notes (Signed)
     Subjective: 2 Days Post-Op Procedure(s) (LRB): TOTAL KNEE ARTHROPLASTY (Left)   Seen by Dr. Alvan Dame. Patient reports pain as mild, pain controlled. No events throughout the night.  Feeling more comfortable with PT.  Ready to be discharged home.     Objective:   VITALS:   Vitals:   03/14/18 0026 03/14/18 0529  BP: (!) 169/89 (!) 162/87  Pulse: 71 69  Resp: 16 17  Temp: 98.6 F (37 C) 98.2 F (36.8 C)  SpO2: 96% 94%    Dorsiflexion/Plantar flexion intact Incision: dressing C/D/I No cellulitis present Compartment soft  LABS Recent Labs    03/13/18 0358 03/14/18 0408  HGB 9.2* 9.9*  HCT 31.4* 33.5*  WBC 9.5 10.6*  PLT 206 206    Recent Labs    03/13/18 0358 03/14/18 0408  NA 137 134*  K 4.3 5.1  BUN 30* 28*  CREATININE 0.95 0.97  GLUCOSE 110* 114*     Assessment/Plan: 2 Days Post-Op Procedure(s) (LRB): TOTAL KNEE ARTHROPLASTY (Left) Up with therapy Discharge home Follow up in 2 weeks at [facility] ([place] Follow up with OLIN,Omer Monter D in 2 weeks.  Contact information:  [facility]) [address] [place] [place]   PAC  03/14/2018, 7:51 AM

## 2018-03-14 NOTE — Progress Notes (Signed)
Physical Therapy Treatment Patient Details Name: Jaclyn Hart MRN: [id number] DOB: August 02, 1950 Today's Date: 03/14/2018    History of Present Illness Pt s/p L TKR and with hx of CVA and R THR (1970)    PT Comments    Pt continues cooperative but pain activity ltd by pain.  Family present and reviewed stairs with written instruction provided.  Pt to initiate OP PT tomorrow am.   Follow Up Recommendations  Follow surgeon's recommendation for DC plan and follow-up therapies     Equipment Recommendations  None recommended by PT    Recommendations for Other Services       Precautions / Restrictions Precautions Precautions: Fall Restrictions Weight Bearing Restrictions: No Other Position/Activity Restrictions: WBAT    Mobility  Bed Mobility               General bed mobility comments: Pt up on EOB  Transfers Overall transfer level: Needs assistance Equipment used: Rolling walker (2 wheeled) Transfers: Sit to/from Stand Sit to Stand: Min guard         General transfer comment: cues for LE management and use of UEs to self assist  Ambulation/Gait Ambulation/Gait assistance: Min guard Gait Distance (Feet): 50 Feet Assistive device: Rolling walker (2 wheeled) Gait Pattern/deviations: Step-to pattern;Decreased step length - right;Decreased step length - left;Shuffle;Trunk flexed Gait velocity: decr   General Gait Details: cues for sequence, posture and position from RW    Stairs Stairs: Yes Stairs assistance: Min assist Stair Management: No rails;Step to pattern;Backwards;With walker Number of Stairs: 2 General stair comments: cues for sequence and foot/RW placement.  Family assisting   Wheelchair Mobility    Modified Rankin (Stroke Patients Only)       Balance Overall balance assessment: Mild deficits observed, not formally tested                                          Cognition Arousal/Alertness: Awake/alert Behavior  During Therapy: WFL for tasks assessed/performed;Impulsive Overall Cognitive Status: Within Functional Limits for tasks assessed                                        Exercises Total Joint Exercises Ankle Circles/Pumps: AROM;Both;15 reps;Supine Quad Sets: AROM;Both;Supine;10 reps Heel Slides: AAROM;Left;Supine;15 reps Straight Leg Raises: AAROM;AROM;Left;Supine;15 reps Goniometric ROM: AAROM R knee -5 - 75    General Comments        Pertinent Vitals/Pain Pain Assessment: 0-10 Pain Score: 8  Pain Location: L knee with activity Pain Descriptors / Indicators: Aching;Sore Pain Intervention(s): Limited activity within patient's tolerance;Monitored during session;Premedicated before session    Home Living                      Prior Function            PT Goals (current goals can now be found in the care plan section) Acute Rehab PT Goals Patient Stated Goal: Less pain PT Goal Formulation: With patient Time For Goal Achievement: 03/20/18 Potential to Achieve Goals: Good Progress towards PT goals: Progressing toward goals    Frequency    7X/week      PT Plan Current plan remains appropriate    Co-evaluation              AM-PAC PT "6 Clicks"  Mobility   Outcome Measure  Help needed turning from your back to your side while in a flat bed without using bedrails?: A Little Help needed moving from lying on your back to sitting on the side of a flat bed without using bedrails?: A Little Help needed moving to and from a bed to a chair (including a wheelchair)?: A Little Help needed standing up from a chair using your arms (e.g., wheelchair or bedside chair)?: A Little Help needed to walk in hospital room?: A Little Help needed climbing 3-5 steps with a railing? : A Little 6 Click Score: 18    End of Session Equipment Utilized During Treatment: Gait belt Activity Tolerance: Patient limited by pain Patient left: with call bell/phone within  reach;in chair;with family/visitor present Nurse Communication: Mobility status PT Visit Diagnosis: Difficulty in walking, not elsewhere classified (R26.2)     Time: 7169-6789 PT Time Calculation (min) (ACUTE ONLY): 21 min  Charges:  $Gait Training: 8-22 mins $Therapeutic Exercise: 8-22 mins                     [place] Pager [phone] Office [phone]    Eleonora Peeler 03/14/2018, 3:15 PM

## 2018-03-14 NOTE — Care Management Note (Signed)
Case Management Note  Patient Details  Name: Jaclyn Hart MRN: [id number] Date of Birth: [birth year]  Subjective/Objective:L TKA. From home.Has dme.d/c plan HEP. No CM needs.                    Action/Plan:d/c home.   Expected Discharge Date:  03/14/18               Expected Discharge Plan:  Home/Self Care  In-House Referral:     Discharge planning Services  CM Consult  Post Acute Care Choice:  Durable Medical Equipment(has 3n1,rw) Choice offered to:     DME Arranged:    DME Agency:     HH Arranged:    HH Agency:     Status of Service:     If discussed at [employer] of Stay Meetings, dates discussed:    Additional Comments:  Dessa Phi, RN 03/14/2018, 11:03 AM

## 2018-03-14 NOTE — Progress Notes (Signed)
RN reviewed discharge instructions with patient and family. All questions answered.   Paperwork and prescriptions given.   NT rolled patient down with all belongings to family car. 

## 2018-03-14 NOTE — Progress Notes (Signed)
Patient complaints of pain level 8 or 9 despite multiple PRN administrations. Patient ambulating to bathroom for frequent urination.Marland Kitchen

## 2018-03-14 NOTE — Anesthesia Postprocedure Evaluation (Signed)
Anesthesia Post Note  Patient: Jaclyn Hart  Procedure(s) Performed: TOTAL KNEE ARTHROPLASTY (Left )     Patient location during evaluation: PACU Anesthesia Type: Spinal Level of consciousness: oriented and awake and alert Pain management: pain level controlled Vital Signs Assessment: post-procedure vital signs reviewed and stable Respiratory status: spontaneous breathing, respiratory function stable and patient connected to nasal cannula oxygen Cardiovascular status: blood pressure returned to baseline and stable Postop Assessment: no headache, no backache and no apparent nausea or vomiting Anesthetic complications: no    Last Vitals:  Vitals:   03/14/18 0026 03/14/18 0529  BP: (!) 169/89 (!) 162/87  Pulse: 71 69  Resp: 16 17  Temp: 37 C 36.8 C  SpO2: 96% 94%               Effie Berkshire

## 2018-03-15 ENCOUNTER — Other Ambulatory Visit: Payer: Self-pay | Admitting: Cardiology

## 2018-03-15 DIAGNOSIS — M1712 Unilateral primary osteoarthritis, left knee: Secondary | ICD-10-CM | POA: Diagnosis not present

## 2018-03-18 DIAGNOSIS — M1712 Unilateral primary osteoarthritis, left knee: Secondary | ICD-10-CM | POA: Diagnosis not present

## 2018-03-19 DIAGNOSIS — M1712 Unilateral primary osteoarthritis, left knee: Secondary | ICD-10-CM | POA: Diagnosis not present

## 2018-03-19 NOTE — Discharge Summary (Signed)
Physician Discharge Summary  Patient ID: Jaclyn Hart MRN: [id number] DOB/AGE: 69-May-1952 69 y.o.  Admit date: 03/12/2018 Discharge date: 03/14/2018   Procedures:  Procedure(s) (LRB): TOTAL KNEE ARTHROPLASTY (Left)  Attending Physician:  Dr. Paralee Cancel   Admission Diagnoses:   Left knee primary OA / pain  Discharge Diagnoses:  Principal Problem:   S/P left TKA Active Problems:   Status post total left knee replacement  Past Medical History:  Diagnosis Date  . Anxiety   . Arthritis    osteoarthritis; knees & shoulders  . Depression   . GERD (gastroesophageal reflux disease)   . H/O hiatal hernia   . Headache(784.0)   . Hypertension   . PONV (postoperative nausea and vomiting)   . Sleep apnea   . Stroke [facility] [place])     HPI:    Jaclyn Hart, 68 y.o. female, has a history of pain and functional disability in the left knee due to arthritis and has failed non-surgical conservative treatments for greater than 12 weeks to include NSAID's and/or analgesics, corticosteriod injections, use of assistive devices and activity modification.  Onset of symptoms was gradual, starting 2+ years ago with gradually worsening course since that time. The patient noted no past surgery on the left knee(s).  Patient currently rates pain in the left knee(s) at 9 out of 10 with activity. Patient has night pain, worsening of pain with activity and weight bearing, pain that interferes with activities of daily living, pain with passive range of motion, crepitus and joint swelling.  Patient has evidence of periarticular osteophytes and joint space narrowing by imaging studies. There is no active infection.  Risks, benefits and expectations were discussed with the patient.  Risks including but not limited to the risk of anesthesia, blood clots, nerve damage, blood vessel damage, failure of the prosthesis, infection and up to and including death.  Patient understand the risks, benefits and expectations and  wishes to proceed with surgery.   PCP: Neale Burly, MD   Discharged Condition: good  Hospital Course:  Patient underwent the above stated procedure on 03/12/2018. Patient tolerated the procedure well and brought to the recovery room in good condition and subsequently to the floor.  POD #1 BP: 155/68 ; Pulse: 59 ; Temp: 98.4 F (36.9 C) ; Resp: 16 Patient reports pain as mild, pain controlled.  Progressed slowly with PT.  No events throughout the night. Plan for discharge tomorrowdue to pain control and need for inpatient therapy to meet goal of being discharged home safely with family/caregiver. Dorsiflexion/plantar flexion intact, incision: dressing C/D/I, no cellulitis present and compartment soft.   LABS  Basename    HGB     9.2  HCT     31.4   POD #2  BP: 162/87 ; Pulse: 69 ; Temp: 98.2 F (36.8 C) ; Resp: 17 Patient reports pain as mild, pain controlled. No events throughout the night.  Feeling more comfortable with PT.  Ready to be discharged home.  Dorsiflexion/plantar flexion intact, incision: dressing C/D/I, no cellulitis present and compartment soft.   LABS  Basename    HGB     9.9  HCT     33.5     Discharge Exam: General appearance: alert, cooperative and no distress Extremities: Homans sign is negative, no sign of DVT, no edema, redness or tenderness in the calves or thighs and no ulcers, gangrene or trophic changes  Disposition:  Home with follow up in 2 weeks   Follow-up Information  Paralee Cancel, MD. Schedule an appointment as soon as possible for a visit in 2 weeks.   Specialty:  Orthopedic Surgery Contact information: [address] [place] [address] [phone]           Discharge Instructions    Call MD / Call 911   Complete by:  As directed    If you experience chest pain or shortness of breath, CALL 911 and be transported to the hospital emergency room.  If you develope a fever above 101 F, pus (white drainage) or  increased drainage or redness at the wound, or calf pain, call your surgeon's office.   Change dressing   Complete by:  As directed    Maintain surgical dressing until follow up in the clinic. If the edges start to pull up, may reinforce with tape. If the dressing is no longer working, may remove and cover with gauze and tape, but must keep the area dry and clean.  Call with any questions or concerns.   Constipation Prevention   Complete by:  As directed    Drink plenty of fluids.  Prune juice may be helpful.  You may use a stool softener, such as Colace (over the counter) 100 mg twice a day.  Use MiraLax (over the counter) for constipation as needed.   Diet - low sodium heart healthy   Complete by:  As directed    Discharge instructions   Complete by:  As directed    Maintain surgical dressing until follow up in the clinic. If the edges start to pull up, may reinforce with tape. If the dressing is no longer working, may remove and cover with gauze and tape, but must keep the area dry and clean.  Follow up in 2 weeks at [facility] Call with any questions or concerns.   Increase activity slowly as tolerated   Complete by:  As directed    Weight bearing as tolerated with assist device (walker, cane, etc) as directed, use it as long as suggested by your surgeon or therapist, typically at least 4-6 weeks.   TED hose   Complete by:  As directed    Use stockings (TED hose) for 2 weeks on both leg(s).  You may remove them at night for sleeping.      Allergies as of 03/14/2018      Reactions   Aspirin    Has history of ulcers; takes a low dose 81 mg daily but avoids 325 mg   Codeine Nausea And Vomiting      Medication List    STOP taking these medications   acetaminophen 325 MG tablet Commonly known as:  TYLENOL   aspirin 81 MG EC tablet Replaced by:  aspirin 81 MG chewable tablet   butalbital-acetaminophen-caffeine 50-325-40 MG tablet Commonly known as:  FIORICET, ESGIC     diclofenac 75 MG EC tablet Commonly known as:  VOLTAREN   nitroGLYCERIN 0.4 MG SL tablet Commonly known as:  NITROSTAT     TAKE these medications   ALPRAZolam 1 MG tablet Commonly known as:  XANAX Take 1 mg by mouth every 8 (eight) hours.   aspirin 81 MG chewable tablet Commonly known as:  ASPIRIN CHILDRENS Chew 1 tablet (81 mg total) by mouth 2 (two) times daily for 30 days. Take for 4 weeks, then resume regular dose. Replaces:  aspirin 81 MG EC tablet   atorvastatin 80 MG tablet Commonly known as:  LIPITOR TAKE ONE TABLET BY MOUTH DAILY  benazepril 10 MG tablet Commonly known as:  LOTENSIN TAKE 1 TABLET BY MOUTH DAILY   docusate sodium 100 MG capsule Commonly known as:  COLACE Take 1 capsule (100 mg total) by mouth 2 (two) times daily. Notes to patient:  Stool softener   ferrous sulfate 325 (65 FE) MG tablet Commonly known as:  FERROUSUL Take 1 tablet (325 mg total) by mouth 3 (three) times daily with meals.   FLUoxetine 10 MG capsule Commonly known as:  PROZAC Take 10 mg by mouth daily.   hydrochlorothiazide 25 MG tablet Commonly known as:  HYDRODIURIL Take 25 mg by mouth daily.   HYDROcodone-acetaminophen 7.5-325 MG tablet Commonly known as:  NORCO Take 1-2 tablets by mouth every 4 (four) hours as needed for moderate pain.   HYDROcodone-acetaminophen 7.5-325 MG tablet Commonly known as:  NORCO Take 1-2 tablets by mouth every 4 (four) hours as needed for moderate pain.   methocarbamol 500 MG tablet Commonly known as:  ROBAXIN Take 1 tablet (500 mg total) by mouth every 6 (six) hours as needed for muscle spasms. Notes to patient:  Muscle relaxer   metoprolol tartrate 50 MG tablet Commonly known as:  LOPRESSOR Take 50 mg by mouth 2 (two) times daily.   MULTIVITAMIN ADULT Chew Chew 1 each by mouth 4 (four) times daily.   ondansetron 4 MG tablet Commonly known as:  ZOFRAN Take 4 mg by mouth every 6 (six) hours as needed for nausea or vomiting. Notes  to patient:  As needed for nausea   polyethylene glycol packet Commonly known as:  MIRALAX / GLYCOLAX Take 17 g by mouth 2 (two) times daily. Notes to patient:  Laxative powder   ranitidine 300 MG tablet Commonly known as:  ZANTAC Take 300 mg by mouth daily.            Discharge Care Instructions  (From admission, onward)         Start     Ordered   03/14/18 0000  Change dressing    Comments:  Maintain surgical dressing until follow up in the clinic. If the edges start to pull up, may reinforce with tape. If the dressing is no longer working, may remove and cover with gauze and tape, but must keep the area dry and clean.  Call with any questions or concerns.   03/14/18 0753           Signed: [place] Pugh. Marquize Seib   PA-C  03/19/2018, 8:36 AM

## 2018-03-20 DIAGNOSIS — M1712 Unilateral primary osteoarthritis, left knee: Secondary | ICD-10-CM | POA: Diagnosis not present

## 2018-03-27 DIAGNOSIS — M1712 Unilateral primary osteoarthritis, left knee: Secondary | ICD-10-CM | POA: Diagnosis not present

## 2018-04-02 DIAGNOSIS — M1712 Unilateral primary osteoarthritis, left knee: Secondary | ICD-10-CM | POA: Diagnosis not present

## 2018-04-03 DIAGNOSIS — M1712 Unilateral primary osteoarthritis, left knee: Secondary | ICD-10-CM | POA: Diagnosis not present

## 2018-04-05 DIAGNOSIS — Z7982 Long term (current) use of aspirin: Secondary | ICD-10-CM | POA: Diagnosis not present

## 2018-04-05 DIAGNOSIS — Z955 Presence of coronary angioplasty implant and graft: Secondary | ICD-10-CM | POA: Diagnosis not present

## 2018-04-05 DIAGNOSIS — X500XXA Overexertion from strenuous movement or load, initial encounter: Secondary | ICD-10-CM | POA: Diagnosis not present

## 2018-04-05 DIAGNOSIS — M25562 Pain in left knee: Secondary | ICD-10-CM | POA: Diagnosis not present

## 2018-04-05 DIAGNOSIS — E7800 Pure hypercholesterolemia, unspecified: Secondary | ICD-10-CM | POA: Diagnosis not present

## 2018-04-05 DIAGNOSIS — Z885 Allergy status to narcotic agent status: Secondary | ICD-10-CM | POA: Diagnosis not present

## 2018-04-05 DIAGNOSIS — Z96652 Presence of left artificial knee joint: Secondary | ICD-10-CM | POA: Diagnosis not present

## 2018-04-05 DIAGNOSIS — K219 Gastro-esophageal reflux disease without esophagitis: Secondary | ICD-10-CM | POA: Diagnosis not present

## 2018-04-05 DIAGNOSIS — I252 Old myocardial infarction: Secondary | ICD-10-CM | POA: Diagnosis not present

## 2018-04-05 DIAGNOSIS — Z79899 Other long term (current) drug therapy: Secondary | ICD-10-CM | POA: Diagnosis not present

## 2018-04-05 DIAGNOSIS — Z471 Aftercare following joint replacement surgery: Secondary | ICD-10-CM | POA: Diagnosis not present

## 2018-04-05 DIAGNOSIS — I10 Essential (primary) hypertension: Secondary | ICD-10-CM | POA: Diagnosis not present

## 2018-05-29 DIAGNOSIS — I10 Essential (primary) hypertension: Secondary | ICD-10-CM | POA: Diagnosis not present

## 2018-05-29 DIAGNOSIS — M1712 Unilateral primary osteoarthritis, left knee: Secondary | ICD-10-CM | POA: Diagnosis not present

## 2018-05-29 DIAGNOSIS — Z6823 Body mass index (BMI) 23.0-23.9, adult: Secondary | ICD-10-CM | POA: Diagnosis not present

## 2018-05-29 DIAGNOSIS — G4733 Obstructive sleep apnea (adult) (pediatric): Secondary | ICD-10-CM | POA: Diagnosis not present

## 2018-05-29 DIAGNOSIS — E785 Hyperlipidemia, unspecified: Secondary | ICD-10-CM | POA: Diagnosis not present

## 2018-05-29 DIAGNOSIS — G43109 Migraine with aura, not intractable, without status migrainosus: Secondary | ICD-10-CM | POA: Diagnosis not present

## 2018-05-29 DIAGNOSIS — M797 Fibromyalgia: Secondary | ICD-10-CM | POA: Diagnosis not present

## 2018-06-26 DIAGNOSIS — I700 Atherosclerosis of aorta: Secondary | ICD-10-CM | POA: Diagnosis not present

## 2018-06-26 DIAGNOSIS — Z96641 Presence of right artificial hip joint: Secondary | ICD-10-CM | POA: Diagnosis not present

## 2018-06-26 DIAGNOSIS — Z7982 Long term (current) use of aspirin: Secondary | ICD-10-CM | POA: Diagnosis not present

## 2018-06-26 DIAGNOSIS — E872 Acidosis: Secondary | ICD-10-CM | POA: Diagnosis not present

## 2018-06-26 DIAGNOSIS — K645 Perianal venous thrombosis: Secondary | ICD-10-CM | POA: Diagnosis not present

## 2018-06-26 DIAGNOSIS — E875 Hyperkalemia: Secondary | ICD-10-CM | POA: Diagnosis not present

## 2018-06-26 DIAGNOSIS — R0689 Other abnormalities of breathing: Secondary | ICD-10-CM | POA: Diagnosis not present

## 2018-06-26 DIAGNOSIS — K219 Gastro-esophageal reflux disease without esophagitis: Secondary | ICD-10-CM | POA: Diagnosis not present

## 2018-06-26 DIAGNOSIS — N390 Urinary tract infection, site not specified: Secondary | ICD-10-CM | POA: Diagnosis not present

## 2018-06-26 DIAGNOSIS — E785 Hyperlipidemia, unspecified: Secondary | ICD-10-CM | POA: Diagnosis not present

## 2018-06-26 DIAGNOSIS — L89310 Pressure ulcer of right buttock, unstageable: Secondary | ICD-10-CM | POA: Diagnosis not present

## 2018-06-26 DIAGNOSIS — R404 Transient alteration of awareness: Secondary | ICD-10-CM | POA: Diagnosis not present

## 2018-06-26 DIAGNOSIS — K922 Gastrointestinal hemorrhage, unspecified: Secondary | ICD-10-CM | POA: Diagnosis not present

## 2018-06-26 DIAGNOSIS — E440 Moderate protein-calorie malnutrition: Secondary | ICD-10-CM | POA: Diagnosis not present

## 2018-06-26 DIAGNOSIS — K625 Hemorrhage of anus and rectum: Secondary | ICD-10-CM | POA: Diagnosis not present

## 2018-06-26 DIAGNOSIS — R000 Tachycardia, unspecified: Secondary | ICD-10-CM | POA: Diagnosis not present

## 2018-06-26 DIAGNOSIS — I10 Essential (primary) hypertension: Secondary | ICD-10-CM | POA: Diagnosis not present

## 2018-06-26 DIAGNOSIS — S22020S Wedge compression fracture of second thoracic vertebra, sequela: Secondary | ICD-10-CM | POA: Diagnosis not present

## 2018-06-26 DIAGNOSIS — L89322 Pressure ulcer of left buttock, stage 2: Secondary | ICD-10-CM | POA: Diagnosis not present

## 2018-06-26 DIAGNOSIS — R58 Hemorrhage, not elsewhere classified: Secondary | ICD-10-CM | POA: Diagnosis not present

## 2018-06-26 DIAGNOSIS — K6289 Other specified diseases of anus and rectum: Secondary | ICD-10-CM | POA: Diagnosis not present

## 2018-06-26 DIAGNOSIS — N179 Acute kidney failure, unspecified: Secondary | ICD-10-CM | POA: Diagnosis not present

## 2018-06-26 DIAGNOSIS — N170 Acute kidney failure with tubular necrosis: Secondary | ICD-10-CM | POA: Diagnosis not present

## 2018-06-26 DIAGNOSIS — I252 Old myocardial infarction: Secondary | ICD-10-CM | POA: Diagnosis not present

## 2018-06-26 DIAGNOSIS — E860 Dehydration: Secondary | ICD-10-CM | POA: Diagnosis not present

## 2018-06-26 DIAGNOSIS — G43909 Migraine, unspecified, not intractable, without status migrainosus: Secondary | ICD-10-CM | POA: Diagnosis not present

## 2018-06-26 DIAGNOSIS — L89320 Pressure ulcer of left buttock, unstageable: Secondary | ICD-10-CM | POA: Diagnosis not present

## 2018-06-26 DIAGNOSIS — R197 Diarrhea, unspecified: Secondary | ICD-10-CM | POA: Diagnosis not present

## 2018-06-26 DIAGNOSIS — B9562 Methicillin resistant Staphylococcus aureus infection as the cause of diseases classified elsewhere: Secondary | ICD-10-CM | POA: Diagnosis not present

## 2018-06-26 DIAGNOSIS — M6282 Rhabdomyolysis: Secondary | ICD-10-CM | POA: Diagnosis not present

## 2018-06-26 DIAGNOSIS — M6281 Muscle weakness (generalized): Secondary | ICD-10-CM | POA: Diagnosis not present

## 2018-06-26 DIAGNOSIS — I959 Hypotension, unspecified: Secondary | ICD-10-CM | POA: Diagnosis not present

## 2018-06-26 DIAGNOSIS — Z6824 Body mass index (BMI) 24.0-24.9, adult: Secondary | ICD-10-CM | POA: Diagnosis not present

## 2018-06-26 DIAGNOSIS — Z87442 Personal history of urinary calculi: Secondary | ICD-10-CM | POA: Diagnosis not present

## 2018-06-26 DIAGNOSIS — R2689 Other abnormalities of gait and mobility: Secondary | ICD-10-CM | POA: Diagnosis not present

## 2018-07-01 DIAGNOSIS — I700 Atherosclerosis of aorta: Secondary | ICD-10-CM | POA: Diagnosis not present

## 2018-07-01 DIAGNOSIS — L89310 Pressure ulcer of right buttock, unstageable: Secondary | ICD-10-CM | POA: Diagnosis not present

## 2018-07-01 DIAGNOSIS — M6281 Muscle weakness (generalized): Secondary | ICD-10-CM | POA: Diagnosis not present

## 2018-07-01 DIAGNOSIS — L89320 Pressure ulcer of left buttock, unstageable: Secondary | ICD-10-CM | POA: Diagnosis not present

## 2018-07-01 DIAGNOSIS — N19 Unspecified kidney failure: Secondary | ICD-10-CM | POA: Diagnosis not present

## 2018-07-01 DIAGNOSIS — S22020S Wedge compression fracture of second thoracic vertebra, sequela: Secondary | ICD-10-CM | POA: Diagnosis not present

## 2018-07-01 DIAGNOSIS — R197 Diarrhea, unspecified: Secondary | ICD-10-CM | POA: Diagnosis not present

## 2018-07-01 DIAGNOSIS — I10 Essential (primary) hypertension: Secondary | ICD-10-CM | POA: Diagnosis not present

## 2018-07-01 DIAGNOSIS — N179 Acute kidney failure, unspecified: Secondary | ICD-10-CM | POA: Diagnosis not present

## 2018-07-01 DIAGNOSIS — B9562 Methicillin resistant Staphylococcus aureus infection as the cause of diseases classified elsewhere: Secondary | ICD-10-CM | POA: Diagnosis not present

## 2018-07-01 DIAGNOSIS — E872 Acidosis: Secondary | ICD-10-CM | POA: Diagnosis not present

## 2018-07-01 DIAGNOSIS — E8889 Other specified metabolic disorders: Secondary | ICD-10-CM | POA: Diagnosis not present

## 2018-07-01 DIAGNOSIS — R2689 Other abnormalities of gait and mobility: Secondary | ICD-10-CM | POA: Diagnosis not present

## 2018-07-01 DIAGNOSIS — L89322 Pressure ulcer of left buttock, stage 2: Secondary | ICD-10-CM | POA: Diagnosis not present

## 2018-07-01 DIAGNOSIS — M6282 Rhabdomyolysis: Secondary | ICD-10-CM | POA: Diagnosis not present

## 2018-07-01 DIAGNOSIS — E440 Moderate protein-calorie malnutrition: Secondary | ICD-10-CM | POA: Diagnosis not present

## 2018-07-02 DIAGNOSIS — N19 Unspecified kidney failure: Secondary | ICD-10-CM | POA: Diagnosis not present

## 2018-07-02 DIAGNOSIS — I700 Atherosclerosis of aorta: Secondary | ICD-10-CM | POA: Diagnosis not present

## 2018-07-02 DIAGNOSIS — E8889 Other specified metabolic disorders: Secondary | ICD-10-CM | POA: Diagnosis not present

## 2018-07-02 DIAGNOSIS — E440 Moderate protein-calorie malnutrition: Secondary | ICD-10-CM | POA: Diagnosis not present

## 2018-07-23 DIAGNOSIS — K644 Residual hemorrhoidal skin tags: Secondary | ICD-10-CM | POA: Diagnosis not present

## 2018-07-23 DIAGNOSIS — L02415 Cutaneous abscess of right lower limb: Secondary | ICD-10-CM | POA: Diagnosis not present

## 2018-07-23 DIAGNOSIS — R748 Abnormal levels of other serum enzymes: Secondary | ICD-10-CM | POA: Diagnosis not present

## 2018-07-23 DIAGNOSIS — E785 Hyperlipidemia, unspecified: Secondary | ICD-10-CM | POA: Diagnosis not present

## 2018-07-23 DIAGNOSIS — K219 Gastro-esophageal reflux disease without esophagitis: Secondary | ICD-10-CM | POA: Diagnosis not present

## 2018-07-23 DIAGNOSIS — M6281 Muscle weakness (generalized): Secondary | ICD-10-CM | POA: Diagnosis not present

## 2018-07-23 DIAGNOSIS — Z96641 Presence of right artificial hip joint: Secondary | ICD-10-CM | POA: Diagnosis not present

## 2018-07-23 DIAGNOSIS — I700 Atherosclerosis of aorta: Secondary | ICD-10-CM | POA: Diagnosis not present

## 2018-07-23 DIAGNOSIS — G43909 Migraine, unspecified, not intractable, without status migrainosus: Secondary | ICD-10-CM | POA: Diagnosis not present

## 2018-07-23 DIAGNOSIS — I10 Essential (primary) hypertension: Secondary | ICD-10-CM | POA: Diagnosis not present

## 2018-07-24 DIAGNOSIS — K644 Residual hemorrhoidal skin tags: Secondary | ICD-10-CM | POA: Diagnosis not present

## 2018-07-24 DIAGNOSIS — E785 Hyperlipidemia, unspecified: Secondary | ICD-10-CM | POA: Diagnosis not present

## 2018-07-24 DIAGNOSIS — K219 Gastro-esophageal reflux disease without esophagitis: Secondary | ICD-10-CM | POA: Diagnosis not present

## 2018-07-24 DIAGNOSIS — I10 Essential (primary) hypertension: Secondary | ICD-10-CM | POA: Diagnosis not present

## 2018-07-24 DIAGNOSIS — Z96641 Presence of right artificial hip joint: Secondary | ICD-10-CM | POA: Diagnosis not present

## 2018-07-24 DIAGNOSIS — I700 Atherosclerosis of aorta: Secondary | ICD-10-CM | POA: Diagnosis not present

## 2018-07-24 DIAGNOSIS — L02415 Cutaneous abscess of right lower limb: Secondary | ICD-10-CM | POA: Diagnosis not present

## 2018-07-24 DIAGNOSIS — R748 Abnormal levels of other serum enzymes: Secondary | ICD-10-CM | POA: Diagnosis not present

## 2018-07-24 DIAGNOSIS — G43909 Migraine, unspecified, not intractable, without status migrainosus: Secondary | ICD-10-CM | POA: Diagnosis not present

## 2018-07-25 DIAGNOSIS — I10 Essential (primary) hypertension: Secondary | ICD-10-CM | POA: Diagnosis not present

## 2018-07-25 DIAGNOSIS — I700 Atherosclerosis of aorta: Secondary | ICD-10-CM | POA: Diagnosis not present

## 2018-07-25 DIAGNOSIS — E785 Hyperlipidemia, unspecified: Secondary | ICD-10-CM | POA: Diagnosis not present

## 2018-07-25 DIAGNOSIS — R748 Abnormal levels of other serum enzymes: Secondary | ICD-10-CM | POA: Diagnosis not present

## 2018-07-25 DIAGNOSIS — K219 Gastro-esophageal reflux disease without esophagitis: Secondary | ICD-10-CM | POA: Diagnosis not present

## 2018-07-25 DIAGNOSIS — L02415 Cutaneous abscess of right lower limb: Secondary | ICD-10-CM | POA: Diagnosis not present

## 2018-07-25 DIAGNOSIS — Z96641 Presence of right artificial hip joint: Secondary | ICD-10-CM | POA: Diagnosis not present

## 2018-07-25 DIAGNOSIS — K644 Residual hemorrhoidal skin tags: Secondary | ICD-10-CM | POA: Diagnosis not present

## 2018-07-25 DIAGNOSIS — G43909 Migraine, unspecified, not intractable, without status migrainosus: Secondary | ICD-10-CM | POA: Diagnosis not present

## 2018-07-26 DIAGNOSIS — G43909 Migraine, unspecified, not intractable, without status migrainosus: Secondary | ICD-10-CM | POA: Diagnosis not present

## 2018-07-26 DIAGNOSIS — K219 Gastro-esophageal reflux disease without esophagitis: Secondary | ICD-10-CM | POA: Diagnosis not present

## 2018-07-26 DIAGNOSIS — I10 Essential (primary) hypertension: Secondary | ICD-10-CM | POA: Diagnosis not present

## 2018-07-26 DIAGNOSIS — R748 Abnormal levels of other serum enzymes: Secondary | ICD-10-CM | POA: Diagnosis not present

## 2018-07-26 DIAGNOSIS — E785 Hyperlipidemia, unspecified: Secondary | ICD-10-CM | POA: Diagnosis not present

## 2018-07-26 DIAGNOSIS — L02415 Cutaneous abscess of right lower limb: Secondary | ICD-10-CM | POA: Diagnosis not present

## 2018-07-26 DIAGNOSIS — K644 Residual hemorrhoidal skin tags: Secondary | ICD-10-CM | POA: Diagnosis not present

## 2018-07-26 DIAGNOSIS — Z96641 Presence of right artificial hip joint: Secondary | ICD-10-CM | POA: Diagnosis not present

## 2018-07-26 DIAGNOSIS — I700 Atherosclerosis of aorta: Secondary | ICD-10-CM | POA: Diagnosis not present

## 2018-07-28 DIAGNOSIS — K648 Other hemorrhoids: Secondary | ICD-10-CM | POA: Diagnosis not present

## 2018-07-28 DIAGNOSIS — L89893 Pressure ulcer of other site, stage 3: Secondary | ICD-10-CM | POA: Diagnosis not present

## 2018-07-28 DIAGNOSIS — E785 Hyperlipidemia, unspecified: Secondary | ICD-10-CM | POA: Diagnosis not present

## 2018-07-28 DIAGNOSIS — E440 Moderate protein-calorie malnutrition: Secondary | ICD-10-CM | POA: Diagnosis not present

## 2018-07-28 DIAGNOSIS — Z96641 Presence of right artificial hip joint: Secondary | ICD-10-CM | POA: Diagnosis not present

## 2018-07-28 DIAGNOSIS — K219 Gastro-esophageal reflux disease without esophagitis: Secondary | ICD-10-CM | POA: Diagnosis not present

## 2018-07-28 DIAGNOSIS — I10 Essential (primary) hypertension: Secondary | ICD-10-CM | POA: Diagnosis not present

## 2018-07-28 DIAGNOSIS — I700 Atherosclerosis of aorta: Secondary | ICD-10-CM | POA: Diagnosis not present

## 2018-07-28 DIAGNOSIS — G43709 Chronic migraine without aura, not intractable, without status migrainosus: Secondary | ICD-10-CM | POA: Diagnosis not present

## 2018-07-30 DIAGNOSIS — I700 Atherosclerosis of aorta: Secondary | ICD-10-CM | POA: Diagnosis not present

## 2018-07-30 DIAGNOSIS — T8189XA Other complications of procedures, not elsewhere classified, initial encounter: Secondary | ICD-10-CM | POA: Diagnosis not present

## 2018-07-30 DIAGNOSIS — K648 Other hemorrhoids: Secondary | ICD-10-CM | POA: Diagnosis not present

## 2018-07-30 DIAGNOSIS — L89893 Pressure ulcer of other site, stage 3: Secondary | ICD-10-CM | POA: Diagnosis not present

## 2018-07-30 DIAGNOSIS — G43709 Chronic migraine without aura, not intractable, without status migrainosus: Secondary | ICD-10-CM | POA: Diagnosis not present

## 2018-07-30 DIAGNOSIS — E785 Hyperlipidemia, unspecified: Secondary | ICD-10-CM | POA: Diagnosis not present

## 2018-07-30 DIAGNOSIS — K219 Gastro-esophageal reflux disease without esophagitis: Secondary | ICD-10-CM | POA: Diagnosis not present

## 2018-07-30 DIAGNOSIS — I10 Essential (primary) hypertension: Secondary | ICD-10-CM | POA: Diagnosis not present

## 2018-07-30 DIAGNOSIS — E440 Moderate protein-calorie malnutrition: Secondary | ICD-10-CM | POA: Diagnosis not present

## 2018-07-30 DIAGNOSIS — Z96641 Presence of right artificial hip joint: Secondary | ICD-10-CM | POA: Diagnosis not present

## 2018-07-31 DIAGNOSIS — K219 Gastro-esophageal reflux disease without esophagitis: Secondary | ICD-10-CM | POA: Diagnosis not present

## 2018-07-31 DIAGNOSIS — L89893 Pressure ulcer of other site, stage 3: Secondary | ICD-10-CM | POA: Diagnosis not present

## 2018-07-31 DIAGNOSIS — G43709 Chronic migraine without aura, not intractable, without status migrainosus: Secondary | ICD-10-CM | POA: Diagnosis not present

## 2018-07-31 DIAGNOSIS — E785 Hyperlipidemia, unspecified: Secondary | ICD-10-CM | POA: Diagnosis not present

## 2018-07-31 DIAGNOSIS — E440 Moderate protein-calorie malnutrition: Secondary | ICD-10-CM | POA: Diagnosis not present

## 2018-07-31 DIAGNOSIS — K648 Other hemorrhoids: Secondary | ICD-10-CM | POA: Diagnosis not present

## 2018-07-31 DIAGNOSIS — Z96641 Presence of right artificial hip joint: Secondary | ICD-10-CM | POA: Diagnosis not present

## 2018-07-31 DIAGNOSIS — I10 Essential (primary) hypertension: Secondary | ICD-10-CM | POA: Diagnosis not present

## 2018-07-31 DIAGNOSIS — I700 Atherosclerosis of aorta: Secondary | ICD-10-CM | POA: Diagnosis not present

## 2018-08-02 DIAGNOSIS — E785 Hyperlipidemia, unspecified: Secondary | ICD-10-CM | POA: Diagnosis not present

## 2018-08-02 DIAGNOSIS — I10 Essential (primary) hypertension: Secondary | ICD-10-CM | POA: Diagnosis not present

## 2018-08-02 DIAGNOSIS — Z96641 Presence of right artificial hip joint: Secondary | ICD-10-CM | POA: Diagnosis not present

## 2018-08-02 DIAGNOSIS — K648 Other hemorrhoids: Secondary | ICD-10-CM | POA: Diagnosis not present

## 2018-08-02 DIAGNOSIS — K219 Gastro-esophageal reflux disease without esophagitis: Secondary | ICD-10-CM | POA: Diagnosis not present

## 2018-08-02 DIAGNOSIS — E440 Moderate protein-calorie malnutrition: Secondary | ICD-10-CM | POA: Diagnosis not present

## 2018-08-02 DIAGNOSIS — G43709 Chronic migraine without aura, not intractable, without status migrainosus: Secondary | ICD-10-CM | POA: Diagnosis not present

## 2018-08-02 DIAGNOSIS — L89893 Pressure ulcer of other site, stage 3: Secondary | ICD-10-CM | POA: Diagnosis not present

## 2018-08-02 DIAGNOSIS — I700 Atherosclerosis of aorta: Secondary | ICD-10-CM | POA: Diagnosis not present

## 2018-08-05 DIAGNOSIS — Z6822 Body mass index (BMI) 22.0-22.9, adult: Secondary | ICD-10-CM | POA: Diagnosis not present

## 2018-08-05 DIAGNOSIS — L02415 Cutaneous abscess of right lower limb: Secondary | ICD-10-CM | POA: Diagnosis not present

## 2018-08-06 DIAGNOSIS — I700 Atherosclerosis of aorta: Secondary | ICD-10-CM | POA: Diagnosis not present

## 2018-08-06 DIAGNOSIS — E785 Hyperlipidemia, unspecified: Secondary | ICD-10-CM | POA: Diagnosis not present

## 2018-08-06 DIAGNOSIS — K648 Other hemorrhoids: Secondary | ICD-10-CM | POA: Diagnosis not present

## 2018-08-06 DIAGNOSIS — Z96641 Presence of right artificial hip joint: Secondary | ICD-10-CM | POA: Diagnosis not present

## 2018-08-06 DIAGNOSIS — L89893 Pressure ulcer of other site, stage 3: Secondary | ICD-10-CM | POA: Diagnosis not present

## 2018-08-06 DIAGNOSIS — I10 Essential (primary) hypertension: Secondary | ICD-10-CM | POA: Diagnosis not present

## 2018-08-06 DIAGNOSIS — E440 Moderate protein-calorie malnutrition: Secondary | ICD-10-CM | POA: Diagnosis not present

## 2018-08-06 DIAGNOSIS — G43709 Chronic migraine without aura, not intractable, without status migrainosus: Secondary | ICD-10-CM | POA: Diagnosis not present

## 2018-08-06 DIAGNOSIS — K219 Gastro-esophageal reflux disease without esophagitis: Secondary | ICD-10-CM | POA: Diagnosis not present

## 2018-08-09 DIAGNOSIS — I10 Essential (primary) hypertension: Secondary | ICD-10-CM | POA: Diagnosis not present

## 2018-08-09 DIAGNOSIS — G43709 Chronic migraine without aura, not intractable, without status migrainosus: Secondary | ICD-10-CM | POA: Diagnosis not present

## 2018-08-09 DIAGNOSIS — Z96641 Presence of right artificial hip joint: Secondary | ICD-10-CM | POA: Diagnosis not present

## 2018-08-09 DIAGNOSIS — I700 Atherosclerosis of aorta: Secondary | ICD-10-CM | POA: Diagnosis not present

## 2018-08-09 DIAGNOSIS — K219 Gastro-esophageal reflux disease without esophagitis: Secondary | ICD-10-CM | POA: Diagnosis not present

## 2018-08-09 DIAGNOSIS — E440 Moderate protein-calorie malnutrition: Secondary | ICD-10-CM | POA: Diagnosis not present

## 2018-08-09 DIAGNOSIS — K648 Other hemorrhoids: Secondary | ICD-10-CM | POA: Diagnosis not present

## 2018-08-09 DIAGNOSIS — E785 Hyperlipidemia, unspecified: Secondary | ICD-10-CM | POA: Diagnosis not present

## 2018-08-09 DIAGNOSIS — L89893 Pressure ulcer of other site, stage 3: Secondary | ICD-10-CM | POA: Diagnosis not present

## 2018-08-12 DIAGNOSIS — Z96641 Presence of right artificial hip joint: Secondary | ICD-10-CM | POA: Diagnosis not present

## 2018-08-12 DIAGNOSIS — K648 Other hemorrhoids: Secondary | ICD-10-CM | POA: Diagnosis not present

## 2018-08-12 DIAGNOSIS — I700 Atherosclerosis of aorta: Secondary | ICD-10-CM | POA: Diagnosis not present

## 2018-08-12 DIAGNOSIS — E440 Moderate protein-calorie malnutrition: Secondary | ICD-10-CM | POA: Diagnosis not present

## 2018-08-12 DIAGNOSIS — L89893 Pressure ulcer of other site, stage 3: Secondary | ICD-10-CM | POA: Diagnosis not present

## 2018-08-12 DIAGNOSIS — I10 Essential (primary) hypertension: Secondary | ICD-10-CM | POA: Diagnosis not present

## 2018-08-12 DIAGNOSIS — G43709 Chronic migraine without aura, not intractable, without status migrainosus: Secondary | ICD-10-CM | POA: Diagnosis not present

## 2018-08-12 DIAGNOSIS — E785 Hyperlipidemia, unspecified: Secondary | ICD-10-CM | POA: Diagnosis not present

## 2018-08-12 DIAGNOSIS — K219 Gastro-esophageal reflux disease without esophagitis: Secondary | ICD-10-CM | POA: Diagnosis not present

## 2018-08-14 DIAGNOSIS — Z96641 Presence of right artificial hip joint: Secondary | ICD-10-CM | POA: Diagnosis not present

## 2018-08-14 DIAGNOSIS — L89893 Pressure ulcer of other site, stage 3: Secondary | ICD-10-CM | POA: Diagnosis not present

## 2018-08-14 DIAGNOSIS — E785 Hyperlipidemia, unspecified: Secondary | ICD-10-CM | POA: Diagnosis not present

## 2018-08-14 DIAGNOSIS — I700 Atherosclerosis of aorta: Secondary | ICD-10-CM | POA: Diagnosis not present

## 2018-08-14 DIAGNOSIS — E440 Moderate protein-calorie malnutrition: Secondary | ICD-10-CM | POA: Diagnosis not present

## 2018-08-14 DIAGNOSIS — K648 Other hemorrhoids: Secondary | ICD-10-CM | POA: Diagnosis not present

## 2018-08-14 DIAGNOSIS — I10 Essential (primary) hypertension: Secondary | ICD-10-CM | POA: Diagnosis not present

## 2018-08-14 DIAGNOSIS — K219 Gastro-esophageal reflux disease without esophagitis: Secondary | ICD-10-CM | POA: Diagnosis not present

## 2018-08-14 DIAGNOSIS — G43709 Chronic migraine without aura, not intractable, without status migrainosus: Secondary | ICD-10-CM | POA: Diagnosis not present

## 2018-08-16 DIAGNOSIS — L89893 Pressure ulcer of other site, stage 3: Secondary | ICD-10-CM | POA: Diagnosis not present

## 2018-08-16 DIAGNOSIS — I700 Atherosclerosis of aorta: Secondary | ICD-10-CM | POA: Diagnosis not present

## 2018-08-16 DIAGNOSIS — G43709 Chronic migraine without aura, not intractable, without status migrainosus: Secondary | ICD-10-CM | POA: Diagnosis not present

## 2018-08-16 DIAGNOSIS — E440 Moderate protein-calorie malnutrition: Secondary | ICD-10-CM | POA: Diagnosis not present

## 2018-08-16 DIAGNOSIS — Z96641 Presence of right artificial hip joint: Secondary | ICD-10-CM | POA: Diagnosis not present

## 2018-08-16 DIAGNOSIS — E785 Hyperlipidemia, unspecified: Secondary | ICD-10-CM | POA: Diagnosis not present

## 2018-08-16 DIAGNOSIS — K648 Other hemorrhoids: Secondary | ICD-10-CM | POA: Diagnosis not present

## 2018-08-16 DIAGNOSIS — I10 Essential (primary) hypertension: Secondary | ICD-10-CM | POA: Diagnosis not present

## 2018-08-16 DIAGNOSIS — K219 Gastro-esophageal reflux disease without esophagitis: Secondary | ICD-10-CM | POA: Diagnosis not present

## 2018-08-18 DIAGNOSIS — M6281 Muscle weakness (generalized): Secondary | ICD-10-CM | POA: Diagnosis not present

## 2018-08-19 DIAGNOSIS — E440 Moderate protein-calorie malnutrition: Secondary | ICD-10-CM | POA: Diagnosis not present

## 2018-08-19 DIAGNOSIS — I700 Atherosclerosis of aorta: Secondary | ICD-10-CM | POA: Diagnosis not present

## 2018-08-19 DIAGNOSIS — I10 Essential (primary) hypertension: Secondary | ICD-10-CM | POA: Diagnosis not present

## 2018-08-19 DIAGNOSIS — K219 Gastro-esophageal reflux disease without esophagitis: Secondary | ICD-10-CM | POA: Diagnosis not present

## 2018-08-19 DIAGNOSIS — E785 Hyperlipidemia, unspecified: Secondary | ICD-10-CM | POA: Diagnosis not present

## 2018-08-19 DIAGNOSIS — L89893 Pressure ulcer of other site, stage 3: Secondary | ICD-10-CM | POA: Diagnosis not present

## 2018-08-19 DIAGNOSIS — Z96641 Presence of right artificial hip joint: Secondary | ICD-10-CM | POA: Diagnosis not present

## 2018-08-19 DIAGNOSIS — G43709 Chronic migraine without aura, not intractable, without status migrainosus: Secondary | ICD-10-CM | POA: Diagnosis not present

## 2018-08-19 DIAGNOSIS — K648 Other hemorrhoids: Secondary | ICD-10-CM | POA: Diagnosis not present

## 2018-08-21 DIAGNOSIS — K219 Gastro-esophageal reflux disease without esophagitis: Secondary | ICD-10-CM | POA: Diagnosis not present

## 2018-08-21 DIAGNOSIS — G43709 Chronic migraine without aura, not intractable, without status migrainosus: Secondary | ICD-10-CM | POA: Diagnosis not present

## 2018-08-21 DIAGNOSIS — E785 Hyperlipidemia, unspecified: Secondary | ICD-10-CM | POA: Diagnosis not present

## 2018-08-21 DIAGNOSIS — I10 Essential (primary) hypertension: Secondary | ICD-10-CM | POA: Diagnosis not present

## 2018-08-21 DIAGNOSIS — I700 Atherosclerosis of aorta: Secondary | ICD-10-CM | POA: Diagnosis not present

## 2018-08-21 DIAGNOSIS — K648 Other hemorrhoids: Secondary | ICD-10-CM | POA: Diagnosis not present

## 2018-08-21 DIAGNOSIS — L89893 Pressure ulcer of other site, stage 3: Secondary | ICD-10-CM | POA: Diagnosis not present

## 2018-08-21 DIAGNOSIS — Z96641 Presence of right artificial hip joint: Secondary | ICD-10-CM | POA: Diagnosis not present

## 2018-08-21 DIAGNOSIS — E440 Moderate protein-calorie malnutrition: Secondary | ICD-10-CM | POA: Diagnosis not present

## 2018-08-23 DIAGNOSIS — G43709 Chronic migraine without aura, not intractable, without status migrainosus: Secondary | ICD-10-CM | POA: Diagnosis not present

## 2018-08-23 DIAGNOSIS — I10 Essential (primary) hypertension: Secondary | ICD-10-CM | POA: Diagnosis not present

## 2018-08-23 DIAGNOSIS — L89893 Pressure ulcer of other site, stage 3: Secondary | ICD-10-CM | POA: Diagnosis not present

## 2018-08-23 DIAGNOSIS — K219 Gastro-esophageal reflux disease without esophagitis: Secondary | ICD-10-CM | POA: Diagnosis not present

## 2018-08-23 DIAGNOSIS — K648 Other hemorrhoids: Secondary | ICD-10-CM | POA: Diagnosis not present

## 2018-08-23 DIAGNOSIS — E440 Moderate protein-calorie malnutrition: Secondary | ICD-10-CM | POA: Diagnosis not present

## 2018-08-23 DIAGNOSIS — E785 Hyperlipidemia, unspecified: Secondary | ICD-10-CM | POA: Diagnosis not present

## 2018-08-23 DIAGNOSIS — Z96641 Presence of right artificial hip joint: Secondary | ICD-10-CM | POA: Diagnosis not present

## 2018-08-23 DIAGNOSIS — I700 Atherosclerosis of aorta: Secondary | ICD-10-CM | POA: Diagnosis not present

## 2018-08-26 DIAGNOSIS — M6281 Muscle weakness (generalized): Secondary | ICD-10-CM | POA: Diagnosis not present

## 2018-08-28 DIAGNOSIS — I10 Essential (primary) hypertension: Secondary | ICD-10-CM | POA: Diagnosis not present

## 2018-08-28 DIAGNOSIS — I700 Atherosclerosis of aorta: Secondary | ICD-10-CM | POA: Diagnosis not present

## 2018-08-28 DIAGNOSIS — E785 Hyperlipidemia, unspecified: Secondary | ICD-10-CM | POA: Diagnosis not present

## 2018-08-28 DIAGNOSIS — Z96641 Presence of right artificial hip joint: Secondary | ICD-10-CM | POA: Diagnosis not present

## 2018-08-28 DIAGNOSIS — K219 Gastro-esophageal reflux disease without esophagitis: Secondary | ICD-10-CM | POA: Diagnosis not present

## 2018-08-28 DIAGNOSIS — G43709 Chronic migraine without aura, not intractable, without status migrainosus: Secondary | ICD-10-CM | POA: Diagnosis not present

## 2018-08-28 DIAGNOSIS — L89893 Pressure ulcer of other site, stage 3: Secondary | ICD-10-CM | POA: Diagnosis not present

## 2018-08-28 DIAGNOSIS — K648 Other hemorrhoids: Secondary | ICD-10-CM | POA: Diagnosis not present

## 2018-08-28 DIAGNOSIS — E440 Moderate protein-calorie malnutrition: Secondary | ICD-10-CM | POA: Diagnosis not present

## 2018-08-31 DIAGNOSIS — E785 Hyperlipidemia, unspecified: Secondary | ICD-10-CM | POA: Diagnosis not present

## 2018-08-31 DIAGNOSIS — I10 Essential (primary) hypertension: Secondary | ICD-10-CM | POA: Diagnosis not present

## 2018-08-31 DIAGNOSIS — L89893 Pressure ulcer of other site, stage 3: Secondary | ICD-10-CM | POA: Diagnosis not present

## 2018-08-31 DIAGNOSIS — K219 Gastro-esophageal reflux disease without esophagitis: Secondary | ICD-10-CM | POA: Diagnosis not present

## 2018-08-31 DIAGNOSIS — Z96641 Presence of right artificial hip joint: Secondary | ICD-10-CM | POA: Diagnosis not present

## 2018-08-31 DIAGNOSIS — I700 Atherosclerosis of aorta: Secondary | ICD-10-CM | POA: Diagnosis not present

## 2018-08-31 DIAGNOSIS — K648 Other hemorrhoids: Secondary | ICD-10-CM | POA: Diagnosis not present

## 2018-08-31 DIAGNOSIS — E440 Moderate protein-calorie malnutrition: Secondary | ICD-10-CM | POA: Diagnosis not present

## 2018-08-31 DIAGNOSIS — G43709 Chronic migraine without aura, not intractable, without status migrainosus: Secondary | ICD-10-CM | POA: Diagnosis not present

## 2018-09-02 DIAGNOSIS — E440 Moderate protein-calorie malnutrition: Secondary | ICD-10-CM | POA: Diagnosis not present

## 2018-09-02 DIAGNOSIS — I10 Essential (primary) hypertension: Secondary | ICD-10-CM | POA: Diagnosis not present

## 2018-09-02 DIAGNOSIS — L89893 Pressure ulcer of other site, stage 3: Secondary | ICD-10-CM | POA: Diagnosis not present

## 2018-09-02 DIAGNOSIS — N182 Chronic kidney disease, stage 2 (mild): Secondary | ICD-10-CM | POA: Diagnosis not present

## 2018-09-02 DIAGNOSIS — Z96641 Presence of right artificial hip joint: Secondary | ICD-10-CM | POA: Diagnosis not present

## 2018-09-02 DIAGNOSIS — I700 Atherosclerosis of aorta: Secondary | ICD-10-CM | POA: Diagnosis not present

## 2018-09-02 DIAGNOSIS — K648 Other hemorrhoids: Secondary | ICD-10-CM | POA: Diagnosis not present

## 2018-09-02 DIAGNOSIS — K219 Gastro-esophageal reflux disease without esophagitis: Secondary | ICD-10-CM | POA: Diagnosis not present

## 2018-09-02 DIAGNOSIS — E785 Hyperlipidemia, unspecified: Secondary | ICD-10-CM | POA: Diagnosis not present

## 2018-09-02 DIAGNOSIS — G43709 Chronic migraine without aura, not intractable, without status migrainosus: Secondary | ICD-10-CM | POA: Diagnosis not present

## 2018-09-05 DIAGNOSIS — Z96641 Presence of right artificial hip joint: Secondary | ICD-10-CM | POA: Diagnosis not present

## 2018-09-05 DIAGNOSIS — K648 Other hemorrhoids: Secondary | ICD-10-CM | POA: Diagnosis not present

## 2018-09-05 DIAGNOSIS — G43709 Chronic migraine without aura, not intractable, without status migrainosus: Secondary | ICD-10-CM | POA: Diagnosis not present

## 2018-09-05 DIAGNOSIS — I700 Atherosclerosis of aorta: Secondary | ICD-10-CM | POA: Diagnosis not present

## 2018-09-05 DIAGNOSIS — K219 Gastro-esophageal reflux disease without esophagitis: Secondary | ICD-10-CM | POA: Diagnosis not present

## 2018-09-05 DIAGNOSIS — E785 Hyperlipidemia, unspecified: Secondary | ICD-10-CM | POA: Diagnosis not present

## 2018-09-05 DIAGNOSIS — L89893 Pressure ulcer of other site, stage 3: Secondary | ICD-10-CM | POA: Diagnosis not present

## 2018-09-05 DIAGNOSIS — E440 Moderate protein-calorie malnutrition: Secondary | ICD-10-CM | POA: Diagnosis not present

## 2018-09-05 DIAGNOSIS — I10 Essential (primary) hypertension: Secondary | ICD-10-CM | POA: Diagnosis not present

## 2018-09-11 DIAGNOSIS — E440 Moderate protein-calorie malnutrition: Secondary | ICD-10-CM | POA: Diagnosis not present

## 2018-09-11 DIAGNOSIS — I10 Essential (primary) hypertension: Secondary | ICD-10-CM | POA: Diagnosis not present

## 2018-09-11 DIAGNOSIS — E785 Hyperlipidemia, unspecified: Secondary | ICD-10-CM | POA: Diagnosis not present

## 2018-09-11 DIAGNOSIS — I700 Atherosclerosis of aorta: Secondary | ICD-10-CM | POA: Diagnosis not present

## 2018-09-11 DIAGNOSIS — K648 Other hemorrhoids: Secondary | ICD-10-CM | POA: Diagnosis not present

## 2018-09-11 DIAGNOSIS — G43709 Chronic migraine without aura, not intractable, without status migrainosus: Secondary | ICD-10-CM | POA: Diagnosis not present

## 2018-09-11 DIAGNOSIS — K219 Gastro-esophageal reflux disease without esophagitis: Secondary | ICD-10-CM | POA: Diagnosis not present

## 2018-09-11 DIAGNOSIS — Z96641 Presence of right artificial hip joint: Secondary | ICD-10-CM | POA: Diagnosis not present

## 2018-09-11 DIAGNOSIS — L89893 Pressure ulcer of other site, stage 3: Secondary | ICD-10-CM | POA: Diagnosis not present

## 2018-09-17 DIAGNOSIS — I10 Essential (primary) hypertension: Secondary | ICD-10-CM | POA: Diagnosis not present

## 2018-09-17 DIAGNOSIS — N3091 Cystitis, unspecified with hematuria: Secondary | ICD-10-CM | POA: Diagnosis not present

## 2018-09-17 DIAGNOSIS — Z1389 Encounter for screening for other disorder: Secondary | ICD-10-CM | POA: Diagnosis not present

## 2018-09-17 DIAGNOSIS — Z0000 Encounter for general adult medical examination without abnormal findings: Secondary | ICD-10-CM | POA: Diagnosis not present

## 2018-09-17 DIAGNOSIS — M6281 Muscle weakness (generalized): Secondary | ICD-10-CM | POA: Diagnosis not present

## 2018-09-17 DIAGNOSIS — G43919 Migraine, unspecified, intractable, without status migrainosus: Secondary | ICD-10-CM | POA: Diagnosis not present

## 2018-09-18 DIAGNOSIS — K219 Gastro-esophageal reflux disease without esophagitis: Secondary | ICD-10-CM | POA: Diagnosis not present

## 2018-09-18 DIAGNOSIS — E785 Hyperlipidemia, unspecified: Secondary | ICD-10-CM | POA: Diagnosis not present

## 2018-09-18 DIAGNOSIS — I700 Atherosclerosis of aorta: Secondary | ICD-10-CM | POA: Diagnosis not present

## 2018-09-18 DIAGNOSIS — L89893 Pressure ulcer of other site, stage 3: Secondary | ICD-10-CM | POA: Diagnosis not present

## 2018-09-18 DIAGNOSIS — E440 Moderate protein-calorie malnutrition: Secondary | ICD-10-CM | POA: Diagnosis not present

## 2018-09-18 DIAGNOSIS — I10 Essential (primary) hypertension: Secondary | ICD-10-CM | POA: Diagnosis not present

## 2018-09-18 DIAGNOSIS — Z96641 Presence of right artificial hip joint: Secondary | ICD-10-CM | POA: Diagnosis not present

## 2018-09-18 DIAGNOSIS — G43709 Chronic migraine without aura, not intractable, without status migrainosus: Secondary | ICD-10-CM | POA: Diagnosis not present

## 2018-09-18 DIAGNOSIS — K648 Other hemorrhoids: Secondary | ICD-10-CM | POA: Diagnosis not present

## 2018-09-20 DIAGNOSIS — E785 Hyperlipidemia, unspecified: Secondary | ICD-10-CM | POA: Diagnosis not present

## 2018-09-20 DIAGNOSIS — K648 Other hemorrhoids: Secondary | ICD-10-CM | POA: Diagnosis not present

## 2018-09-20 DIAGNOSIS — I10 Essential (primary) hypertension: Secondary | ICD-10-CM | POA: Diagnosis not present

## 2018-09-20 DIAGNOSIS — K219 Gastro-esophageal reflux disease without esophagitis: Secondary | ICD-10-CM | POA: Diagnosis not present

## 2018-09-20 DIAGNOSIS — G43709 Chronic migraine without aura, not intractable, without status migrainosus: Secondary | ICD-10-CM | POA: Diagnosis not present

## 2018-09-20 DIAGNOSIS — L89893 Pressure ulcer of other site, stage 3: Secondary | ICD-10-CM | POA: Diagnosis not present

## 2018-09-20 DIAGNOSIS — E440 Moderate protein-calorie malnutrition: Secondary | ICD-10-CM | POA: Diagnosis not present

## 2018-09-20 DIAGNOSIS — I700 Atherosclerosis of aorta: Secondary | ICD-10-CM | POA: Diagnosis not present

## 2018-09-20 DIAGNOSIS — Z96641 Presence of right artificial hip joint: Secondary | ICD-10-CM | POA: Diagnosis not present

## 2018-09-23 DIAGNOSIS — L89893 Pressure ulcer of other site, stage 3: Secondary | ICD-10-CM | POA: Diagnosis not present

## 2018-09-23 DIAGNOSIS — I10 Essential (primary) hypertension: Secondary | ICD-10-CM | POA: Diagnosis not present

## 2018-09-23 DIAGNOSIS — Z96641 Presence of right artificial hip joint: Secondary | ICD-10-CM | POA: Diagnosis not present

## 2018-09-23 DIAGNOSIS — E785 Hyperlipidemia, unspecified: Secondary | ICD-10-CM | POA: Diagnosis not present

## 2018-09-23 DIAGNOSIS — G43709 Chronic migraine without aura, not intractable, without status migrainosus: Secondary | ICD-10-CM | POA: Diagnosis not present

## 2018-09-23 DIAGNOSIS — I700 Atherosclerosis of aorta: Secondary | ICD-10-CM | POA: Diagnosis not present

## 2018-09-23 DIAGNOSIS — K219 Gastro-esophageal reflux disease without esophagitis: Secondary | ICD-10-CM | POA: Diagnosis not present

## 2018-09-23 DIAGNOSIS — K648 Other hemorrhoids: Secondary | ICD-10-CM | POA: Diagnosis not present

## 2018-09-23 DIAGNOSIS — E440 Moderate protein-calorie malnutrition: Secondary | ICD-10-CM | POA: Diagnosis not present

## 2018-09-25 DIAGNOSIS — G43709 Chronic migraine without aura, not intractable, without status migrainosus: Secondary | ICD-10-CM | POA: Diagnosis not present

## 2018-09-25 DIAGNOSIS — K648 Other hemorrhoids: Secondary | ICD-10-CM | POA: Diagnosis not present

## 2018-09-25 DIAGNOSIS — E440 Moderate protein-calorie malnutrition: Secondary | ICD-10-CM | POA: Diagnosis not present

## 2018-09-25 DIAGNOSIS — L89893 Pressure ulcer of other site, stage 3: Secondary | ICD-10-CM | POA: Diagnosis not present

## 2018-09-25 DIAGNOSIS — I10 Essential (primary) hypertension: Secondary | ICD-10-CM | POA: Diagnosis not present

## 2018-09-25 DIAGNOSIS — Z96641 Presence of right artificial hip joint: Secondary | ICD-10-CM | POA: Diagnosis not present

## 2018-09-25 DIAGNOSIS — I700 Atherosclerosis of aorta: Secondary | ICD-10-CM | POA: Diagnosis not present

## 2018-09-25 DIAGNOSIS — E785 Hyperlipidemia, unspecified: Secondary | ICD-10-CM | POA: Diagnosis not present

## 2018-09-25 DIAGNOSIS — K219 Gastro-esophageal reflux disease without esophagitis: Secondary | ICD-10-CM | POA: Diagnosis not present

## 2018-09-27 DIAGNOSIS — E872 Acidosis: Secondary | ICD-10-CM | POA: Diagnosis not present

## 2018-09-27 DIAGNOSIS — Z8781 Personal history of (healed) traumatic fracture: Secondary | ICD-10-CM | POA: Diagnosis not present

## 2018-09-27 DIAGNOSIS — I10 Essential (primary) hypertension: Secondary | ICD-10-CM | POA: Diagnosis not present

## 2018-09-27 DIAGNOSIS — K648 Other hemorrhoids: Secondary | ICD-10-CM | POA: Diagnosis not present

## 2018-09-27 DIAGNOSIS — I700 Atherosclerosis of aorta: Secondary | ICD-10-CM | POA: Diagnosis not present

## 2018-09-27 DIAGNOSIS — Z96641 Presence of right artificial hip joint: Secondary | ICD-10-CM | POA: Diagnosis not present

## 2018-09-27 DIAGNOSIS — L89893 Pressure ulcer of other site, stage 3: Secondary | ICD-10-CM | POA: Diagnosis not present

## 2018-09-27 DIAGNOSIS — Z9181 History of falling: Secondary | ICD-10-CM | POA: Diagnosis not present

## 2018-09-27 DIAGNOSIS — E440 Moderate protein-calorie malnutrition: Secondary | ICD-10-CM | POA: Diagnosis not present

## 2018-09-27 DIAGNOSIS — K219 Gastro-esophageal reflux disease without esophagitis: Secondary | ICD-10-CM | POA: Diagnosis not present

## 2018-10-02 DIAGNOSIS — E872 Acidosis: Secondary | ICD-10-CM | POA: Diagnosis not present

## 2018-10-02 DIAGNOSIS — Z96641 Presence of right artificial hip joint: Secondary | ICD-10-CM | POA: Diagnosis not present

## 2018-10-02 DIAGNOSIS — E440 Moderate protein-calorie malnutrition: Secondary | ICD-10-CM | POA: Diagnosis not present

## 2018-10-02 DIAGNOSIS — Z9181 History of falling: Secondary | ICD-10-CM | POA: Diagnosis not present

## 2018-10-02 DIAGNOSIS — I10 Essential (primary) hypertension: Secondary | ICD-10-CM | POA: Diagnosis not present

## 2018-10-02 DIAGNOSIS — K219 Gastro-esophageal reflux disease without esophagitis: Secondary | ICD-10-CM | POA: Diagnosis not present

## 2018-10-02 DIAGNOSIS — Z8781 Personal history of (healed) traumatic fracture: Secondary | ICD-10-CM | POA: Diagnosis not present

## 2018-10-02 DIAGNOSIS — K648 Other hemorrhoids: Secondary | ICD-10-CM | POA: Diagnosis not present

## 2018-10-02 DIAGNOSIS — I700 Atherosclerosis of aorta: Secondary | ICD-10-CM | POA: Diagnosis not present

## 2018-10-02 DIAGNOSIS — L89893 Pressure ulcer of other site, stage 3: Secondary | ICD-10-CM | POA: Diagnosis not present

## 2018-10-08 DIAGNOSIS — I10 Essential (primary) hypertension: Secondary | ICD-10-CM | POA: Diagnosis not present

## 2018-10-08 DIAGNOSIS — K219 Gastro-esophageal reflux disease without esophagitis: Secondary | ICD-10-CM | POA: Diagnosis not present

## 2018-10-08 DIAGNOSIS — E440 Moderate protein-calorie malnutrition: Secondary | ICD-10-CM | POA: Diagnosis not present

## 2018-10-08 DIAGNOSIS — Z96641 Presence of right artificial hip joint: Secondary | ICD-10-CM | POA: Diagnosis not present

## 2018-10-08 DIAGNOSIS — I700 Atherosclerosis of aorta: Secondary | ICD-10-CM | POA: Diagnosis not present

## 2018-10-08 DIAGNOSIS — K648 Other hemorrhoids: Secondary | ICD-10-CM | POA: Diagnosis not present

## 2018-10-08 DIAGNOSIS — L89893 Pressure ulcer of other site, stage 3: Secondary | ICD-10-CM | POA: Diagnosis not present

## 2018-10-08 DIAGNOSIS — Z8781 Personal history of (healed) traumatic fracture: Secondary | ICD-10-CM | POA: Diagnosis not present

## 2018-10-08 DIAGNOSIS — Z9181 History of falling: Secondary | ICD-10-CM | POA: Diagnosis not present

## 2018-10-08 DIAGNOSIS — E872 Acidosis: Secondary | ICD-10-CM | POA: Diagnosis not present

## 2018-10-11 DIAGNOSIS — E278 Other specified disorders of adrenal gland: Secondary | ICD-10-CM | POA: Diagnosis not present

## 2018-10-11 DIAGNOSIS — E875 Hyperkalemia: Secondary | ICD-10-CM | POA: Diagnosis not present

## 2018-10-11 DIAGNOSIS — N3001 Acute cystitis with hematuria: Secondary | ICD-10-CM | POA: Diagnosis not present

## 2018-10-11 DIAGNOSIS — E872 Acidosis: Secondary | ICD-10-CM | POA: Diagnosis not present

## 2018-10-11 DIAGNOSIS — N179 Acute kidney failure, unspecified: Secondary | ICD-10-CM | POA: Diagnosis not present

## 2018-10-11 DIAGNOSIS — K449 Diaphragmatic hernia without obstruction or gangrene: Secondary | ICD-10-CM | POA: Diagnosis not present

## 2018-10-11 DIAGNOSIS — K5730 Diverticulosis of large intestine without perforation or abscess without bleeding: Secondary | ICD-10-CM | POA: Diagnosis not present

## 2018-10-12 DIAGNOSIS — N179 Acute kidney failure, unspecified: Secondary | ICD-10-CM | POA: Diagnosis not present

## 2018-10-12 DIAGNOSIS — I10 Essential (primary) hypertension: Secondary | ICD-10-CM | POA: Diagnosis not present

## 2018-10-12 DIAGNOSIS — I700 Atherosclerosis of aorta: Secondary | ICD-10-CM | POA: Diagnosis not present

## 2018-10-12 DIAGNOSIS — E785 Hyperlipidemia, unspecified: Secondary | ICD-10-CM | POA: Diagnosis not present

## 2018-10-12 DIAGNOSIS — R310 Gross hematuria: Secondary | ICD-10-CM | POA: Diagnosis not present

## 2018-10-12 DIAGNOSIS — E278 Other specified disorders of adrenal gland: Secondary | ICD-10-CM | POA: Diagnosis not present

## 2018-10-12 DIAGNOSIS — K449 Diaphragmatic hernia without obstruction or gangrene: Secondary | ICD-10-CM | POA: Diagnosis not present

## 2018-10-12 DIAGNOSIS — E875 Hyperkalemia: Secondary | ICD-10-CM | POA: Diagnosis not present

## 2018-10-12 DIAGNOSIS — E872 Acidosis: Secondary | ICD-10-CM | POA: Diagnosis not present

## 2018-10-12 DIAGNOSIS — N12 Tubulo-interstitial nephritis, not specified as acute or chronic: Secondary | ICD-10-CM | POA: Diagnosis not present

## 2018-10-12 DIAGNOSIS — K5730 Diverticulosis of large intestine without perforation or abscess without bleeding: Secondary | ICD-10-CM | POA: Diagnosis not present

## 2018-10-12 DIAGNOSIS — Z87442 Personal history of urinary calculi: Secondary | ICD-10-CM | POA: Diagnosis not present

## 2018-10-12 DIAGNOSIS — G43909 Migraine, unspecified, not intractable, without status migrainosus: Secondary | ICD-10-CM | POA: Diagnosis not present

## 2018-10-12 DIAGNOSIS — K219 Gastro-esophageal reflux disease without esophagitis: Secondary | ICD-10-CM | POA: Diagnosis not present

## 2018-10-12 DIAGNOSIS — I252 Old myocardial infarction: Secondary | ICD-10-CM | POA: Diagnosis not present

## 2018-10-12 DIAGNOSIS — E279 Disorder of adrenal gland, unspecified: Secondary | ICD-10-CM | POA: Diagnosis not present

## 2018-10-12 DIAGNOSIS — N3001 Acute cystitis with hematuria: Secondary | ICD-10-CM | POA: Diagnosis not present

## 2018-10-18 DIAGNOSIS — Z96641 Presence of right artificial hip joint: Secondary | ICD-10-CM | POA: Diagnosis not present

## 2018-10-18 DIAGNOSIS — K648 Other hemorrhoids: Secondary | ICD-10-CM | POA: Diagnosis not present

## 2018-10-18 DIAGNOSIS — Z9181 History of falling: Secondary | ICD-10-CM | POA: Diagnosis not present

## 2018-10-18 DIAGNOSIS — E440 Moderate protein-calorie malnutrition: Secondary | ICD-10-CM | POA: Diagnosis not present

## 2018-10-18 DIAGNOSIS — K219 Gastro-esophageal reflux disease without esophagitis: Secondary | ICD-10-CM | POA: Diagnosis not present

## 2018-10-18 DIAGNOSIS — Z8781 Personal history of (healed) traumatic fracture: Secondary | ICD-10-CM | POA: Diagnosis not present

## 2018-10-18 DIAGNOSIS — E872 Acidosis: Secondary | ICD-10-CM | POA: Diagnosis not present

## 2018-10-18 DIAGNOSIS — I10 Essential (primary) hypertension: Secondary | ICD-10-CM | POA: Diagnosis not present

## 2018-10-18 DIAGNOSIS — I700 Atherosclerosis of aorta: Secondary | ICD-10-CM | POA: Diagnosis not present

## 2018-10-18 DIAGNOSIS — L89893 Pressure ulcer of other site, stage 3: Secondary | ICD-10-CM | POA: Diagnosis not present

## 2018-10-18 DIAGNOSIS — M6281 Muscle weakness (generalized): Secondary | ICD-10-CM | POA: Diagnosis not present

## 2018-10-22 DIAGNOSIS — N390 Urinary tract infection, site not specified: Secondary | ICD-10-CM | POA: Diagnosis not present

## 2018-10-22 DIAGNOSIS — K649 Unspecified hemorrhoids: Secondary | ICD-10-CM | POA: Diagnosis not present

## 2018-10-25 DIAGNOSIS — L89893 Pressure ulcer of other site, stage 3: Secondary | ICD-10-CM | POA: Diagnosis not present

## 2018-10-25 DIAGNOSIS — Z96641 Presence of right artificial hip joint: Secondary | ICD-10-CM | POA: Diagnosis not present

## 2018-10-25 DIAGNOSIS — K219 Gastro-esophageal reflux disease without esophagitis: Secondary | ICD-10-CM | POA: Diagnosis not present

## 2018-10-25 DIAGNOSIS — N182 Chronic kidney disease, stage 2 (mild): Secondary | ICD-10-CM | POA: Diagnosis not present

## 2018-10-25 DIAGNOSIS — I700 Atherosclerosis of aorta: Secondary | ICD-10-CM | POA: Diagnosis not present

## 2018-10-25 DIAGNOSIS — K648 Other hemorrhoids: Secondary | ICD-10-CM | POA: Diagnosis not present

## 2018-10-25 DIAGNOSIS — Z9181 History of falling: Secondary | ICD-10-CM | POA: Diagnosis not present

## 2018-10-25 DIAGNOSIS — E872 Acidosis: Secondary | ICD-10-CM | POA: Diagnosis not present

## 2018-10-25 DIAGNOSIS — E782 Mixed hyperlipidemia: Secondary | ICD-10-CM | POA: Diagnosis not present

## 2018-10-25 DIAGNOSIS — E440 Moderate protein-calorie malnutrition: Secondary | ICD-10-CM | POA: Diagnosis not present

## 2018-10-25 DIAGNOSIS — I10 Essential (primary) hypertension: Secondary | ICD-10-CM | POA: Diagnosis not present

## 2018-10-25 DIAGNOSIS — Z8781 Personal history of (healed) traumatic fracture: Secondary | ICD-10-CM | POA: Diagnosis not present

## 2018-10-26 ENCOUNTER — Inpatient Hospital Stay (HOSPITAL_COMMUNITY)
Admission: EM | Admit: 2018-10-26 | Discharge: 2018-10-28 | DRG: 670 | Disposition: A | Discharge status: Home / Self Care | Payer: Medicare Other | Attending: Internal Medicine | Admitting: Internal Medicine

## 2018-10-26 ENCOUNTER — Emergency Department (HOSPITAL_COMMUNITY): Payer: Medicare Other

## 2018-10-26 ENCOUNTER — Encounter (HOSPITAL_COMMUNITY): Payer: Self-pay | Admitting: Internal Medicine

## 2018-10-26 ENCOUNTER — Other Ambulatory Visit: Payer: Self-pay

## 2018-10-26 DIAGNOSIS — F419 Anxiety disorder, unspecified: Secondary | ICD-10-CM | POA: Diagnosis present

## 2018-10-26 DIAGNOSIS — I10 Essential (primary) hypertension: Secondary | ICD-10-CM

## 2018-10-26 DIAGNOSIS — K449 Diaphragmatic hernia without obstruction or gangrene: Secondary | ICD-10-CM | POA: Diagnosis not present

## 2018-10-26 DIAGNOSIS — Z9049 Acquired absence of other specified parts of digestive tract: Secondary | ICD-10-CM

## 2018-10-26 DIAGNOSIS — E785 Hyperlipidemia, unspecified: Secondary | ICD-10-CM | POA: Diagnosis present

## 2018-10-26 DIAGNOSIS — Z8673 Personal history of transient ischemic attack (TIA), and cerebral infarction without residual deficits: Secondary | ICD-10-CM

## 2018-10-26 DIAGNOSIS — Z96652 Presence of left artificial knee joint: Secondary | ICD-10-CM | POA: Diagnosis present

## 2018-10-26 DIAGNOSIS — F17210 Nicotine dependence, cigarettes, uncomplicated: Secondary | ICD-10-CM | POA: Diagnosis present

## 2018-10-26 DIAGNOSIS — N3000 Acute cystitis without hematuria: Secondary | ICD-10-CM | POA: Diagnosis not present

## 2018-10-26 DIAGNOSIS — D649 Anemia, unspecified: Secondary | ICD-10-CM | POA: Diagnosis present

## 2018-10-26 DIAGNOSIS — G4730 Sleep apnea, unspecified: Secondary | ICD-10-CM | POA: Diagnosis not present

## 2018-10-26 DIAGNOSIS — Z955 Presence of coronary angioplasty implant and graft: Secondary | ICD-10-CM

## 2018-10-26 DIAGNOSIS — R319 Hematuria, unspecified: Secondary | ICD-10-CM | POA: Diagnosis present

## 2018-10-26 DIAGNOSIS — D494 Neoplasm of unspecified behavior of bladder: Secondary | ICD-10-CM | POA: Diagnosis not present

## 2018-10-26 DIAGNOSIS — N3289 Other specified disorders of bladder: Secondary | ICD-10-CM | POA: Diagnosis present

## 2018-10-26 DIAGNOSIS — Z03818 Encounter for observation for suspected exposure to other biological agents ruled out: Secondary | ICD-10-CM | POA: Diagnosis not present

## 2018-10-26 DIAGNOSIS — K219 Gastro-esophageal reflux disease without esophagitis: Secondary | ICD-10-CM | POA: Diagnosis present

## 2018-10-26 DIAGNOSIS — Z79899 Other long term (current) drug therapy: Secondary | ICD-10-CM

## 2018-10-26 DIAGNOSIS — Z20828 Contact with and (suspected) exposure to other viral communicable diseases: Secondary | ICD-10-CM | POA: Diagnosis present

## 2018-10-26 DIAGNOSIS — N3010 Interstitial cystitis (chronic) without hematuria: Secondary | ICD-10-CM | POA: Diagnosis not present

## 2018-10-26 DIAGNOSIS — N390 Urinary tract infection, site not specified: Secondary | ICD-10-CM | POA: Diagnosis present

## 2018-10-26 DIAGNOSIS — R310 Gross hematuria: Secondary | ICD-10-CM | POA: Diagnosis not present

## 2018-10-26 DIAGNOSIS — M170 Bilateral primary osteoarthritis of knee: Secondary | ICD-10-CM | POA: Diagnosis not present

## 2018-10-26 DIAGNOSIS — G43909 Migraine, unspecified, not intractable, without status migrainosus: Secondary | ICD-10-CM | POA: Diagnosis not present

## 2018-10-26 HISTORY — DX: Gross hematuria: R31.0

## 2018-10-26 HISTORY — DX: Other specified disorders of bladder: N32.89

## 2018-10-26 LAB — CBC WITH DIFFERENTIAL/PLATELET
Abs Immature Granulocytes: 0.02 10*3/uL (ref 0.00–0.07)
Basophils Absolute: 0.1 10*3/uL (ref 0.0–0.1)
Basophils Relative: 1 %
Eosinophils Absolute: 0.3 10*3/uL (ref 0.0–0.5)
Eosinophils Relative: 3 %
HCT: 35.5 % — ABNORMAL LOW (ref 36.0–46.0)
Hemoglobin: 10.3 g/dL — ABNORMAL LOW (ref 12.0–15.0)
Immature Granulocytes: 0 %
Lymphocytes Relative: 23 %
Lymphs Abs: 2.2 10*3/uL (ref 0.7–4.0)
MCH: 31.2 pg (ref 26.0–34.0)
MCHC: 29 g/dL — ABNORMAL LOW (ref 30.0–36.0)
MCV: 107.6 fL — ABNORMAL HIGH (ref 80.0–100.0)
Monocytes Absolute: 0.9 10*3/uL (ref 0.1–1.0)
Monocytes Relative: 10 %
Neutro Abs: 5.9 10*3/uL (ref 1.7–7.7)
Neutrophils Relative %: 63 %
Platelets: 299 10*3/uL (ref 150–400)
RBC: 3.3 MIL/uL — ABNORMAL LOW (ref 3.87–5.11)
RDW: 14.7 % (ref 11.5–15.5)
WBC: 9.3 10*3/uL (ref 4.0–10.5)
nRBC: 0 % (ref 0.0–0.2)

## 2018-10-26 LAB — URINALYSIS, ROUTINE W REFLEX MICROSCOPIC
Bilirubin Urine: NEGATIVE
Glucose, UA: NEGATIVE mg/dL
Hgb urine dipstick: NEGATIVE
Ketones, ur: NEGATIVE mg/dL
Leukocytes,Ua: NEGATIVE
Nitrite: NEGATIVE
Protein, ur: NEGATIVE mg/dL

## 2018-10-26 LAB — COMPREHENSIVE METABOLIC PANEL
ALT: 18 U/L (ref 0–44)
AST: 16 U/L (ref 15–41)
Albumin: 3.2 g/dL — ABNORMAL LOW (ref 3.5–5.0)
Alkaline Phosphatase: 97 U/L (ref 38–126)
Anion gap: 10 (ref 5–15)
BUN: 40 mg/dL — ABNORMAL HIGH (ref 8–23)
CO2: 16 mmol/L — ABNORMAL LOW (ref 22–32)
Calcium: 8.5 mg/dL — ABNORMAL LOW (ref 8.9–10.3)
Chloride: 116 mmol/L — ABNORMAL HIGH (ref 98–111)
Creatinine, Ser: 0.95 mg/dL (ref 0.44–1.00)
GFR calc Af Amer: >60 mL/min (ref >60)
GFR calc non Af Amer: >60 mL/min (ref >60)
Glucose, Bld: 82 mg/dL (ref 70–99)
Potassium: 5.1 mmol/L (ref 3.5–5.1)
Sodium: 142 mmol/L (ref 135–145)
Total Bilirubin: 0.5 mg/dL (ref 0.3–1.2)
Total Protein: 6 g/dL — ABNORMAL LOW (ref 6.5–8.1)

## 2018-10-26 LAB — URINALYSIS, MICROSCOPIC (REFLEX)

## 2018-10-26 MED ORDER — ACETAMINOPHEN 650 MG RE SUPP: 650.0000 mg | Freq: Four times a day (QID) | RECTAL | Status: DC | PRN

## 2018-10-26 MED ORDER — ALPRAZOLAM 0.25 MG PO TABS
1.0000 mg | ORAL_TABLET | Freq: Once | ORAL | Status: AC
  Administered 2018-10-26: 1 mg via ORAL
  Filled 2018-10-26: qty 4

## 2018-10-26 MED ORDER — ACETAMINOPHEN 325 MG PO TABS
650.0000 mg | ORAL_TABLET | Freq: Four times a day (QID) | ORAL | Status: DC | PRN
  Administered 2018-10-27 – 2018-10-28 (×2): 650 mg via ORAL
  Filled 2018-10-26 (×3): qty 2

## 2018-10-26 MED ORDER — SODIUM CHLORIDE 0.9 % IV BOLUS
1000.0000 mL | Freq: Once | INTRAVENOUS | Status: AC
  Administered 2018-10-26: 1000 mL via INTRAVENOUS

## 2018-10-26 MED ORDER — SODIUM CHLORIDE 0.9 % IV SOLN
1.0000 g | Freq: Once | INTRAVENOUS | Status: AC
  Administered 2018-10-26: 1 g via INTRAVENOUS
  Filled 2018-10-26: qty 10

## 2018-10-26 MED ORDER — ACETAMINOPHEN 325 MG PO TABS
650.0000 mg | ORAL_TABLET | Freq: Once | ORAL | Status: AC
  Administered 2018-10-26: 650 mg via ORAL
  Filled 2018-10-26: qty 2

## 2018-10-26 MED ORDER — SODIUM CHLORIDE 0.9 % IV SOLN
INTRAVENOUS | Status: DC
  Administered 2018-10-26: via INTRAVENOUS

## 2018-10-26 MED ORDER — PANTOPRAZOLE SODIUM 40 MG PO TBEC
40.0000 mg | DELAYED_RELEASE_TABLET | Freq: Once | ORAL | Status: AC
  Administered 2018-10-26: 40 mg via ORAL
  Filled 2018-10-26: qty 1
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
## 2018-10-26 NOTE — ED Triage Notes (Addendum)
Pt arrived via POV with c/o of bright red blood urine that started last night. Pt c/o of weakness as well.  Per patients daughter, patient was recently admitted to [facility] a few weeks ago for blood in urine and discharged with antibiotics for UTI.
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
## 2018-10-26 NOTE — ED Notes (Signed)
Currently waiting on the Admitting MD to finish speaking with patient so that this NT can transport patient upstairs.

## 2018-10-26 NOTE — ED Notes (Signed)
Jaclyn Hart (DAUGHTER): [phone], CALL HER AT ANYTIME.

## 2018-10-27 ENCOUNTER — Encounter (HOSPITAL_COMMUNITY)
Admission: EM | Disposition: A | Discharge status: Home / Self Care | Payer: Self-pay | Source: Home / Self Care | Attending: Internal Medicine

## 2018-10-27 ENCOUNTER — Encounter (HOSPITAL_COMMUNITY): Payer: Self-pay | Admitting: General Practice

## 2018-10-27 ENCOUNTER — Inpatient Hospital Stay (HOSPITAL_COMMUNITY): Payer: Medicare Other | Admitting: Certified Registered"

## 2018-10-27 HISTORY — PX: CYSTOSCOPY: SHX5120

## 2018-10-27 LAB — CBC
HCT: 31.4 % — ABNORMAL LOW (ref 36.0–46.0)
Hemoglobin: 9.5 g/dL — ABNORMAL LOW (ref 12.0–15.0)
MCH: 31.5 pg (ref 26.0–34.0)
MCHC: 30.3 g/dL (ref 30.0–36.0)
MCV: 104 fL — ABNORMAL HIGH (ref 80.0–100.0)
Platelets: 280 10*3/uL (ref 150–400)
RBC: 3.02 MIL/uL — ABNORMAL LOW (ref 3.87–5.11)
RDW: 14.7 % (ref 11.5–15.5)
WBC: 7.5 10*3/uL (ref 4.0–10.5)
nRBC: 0 % (ref 0.0–0.2)

## 2018-10-27 LAB — SARS CORONAVIRUS 2 (TAT 6-24 HRS): SARS Coronavirus 2: NEGATIVE

## 2018-10-27 LAB — COMPREHENSIVE METABOLIC PANEL
ALT: 17 U/L (ref 0–44)
AST: 13 U/L — ABNORMAL LOW (ref 15–41)
Albumin: 2.8 g/dL — ABNORMAL LOW (ref 3.5–5.0)
Alkaline Phosphatase: 93 U/L (ref 38–126)
Anion gap: 10 (ref 5–15)
BUN: 33 mg/dL — ABNORMAL HIGH (ref 8–23)
CO2: 16 mmol/L — ABNORMAL LOW (ref 22–32)
Calcium: 8.5 mg/dL — ABNORMAL LOW (ref 8.9–10.3)
Chloride: 116 mmol/L — ABNORMAL HIGH (ref 98–111)
Creatinine, Ser: 0.9 mg/dL (ref 0.44–1.00)
GFR calc Af Amer: >60 mL/min (ref >60)
GFR calc non Af Amer: >60 mL/min (ref >60)
Glucose, Bld: 87 mg/dL (ref 70–99)
Potassium: 4.1 mmol/L (ref 3.5–5.1)
Sodium: 142 mmol/L (ref 135–145)
Total Bilirubin: 0.4 mg/dL (ref 0.3–1.2)
Total Protein: 5.1 g/dL — ABNORMAL LOW (ref 6.5–8.1)

## 2018-10-27 LAB — HIV ANTIBODY (ROUTINE TESTING W REFLEX): HIV Screen 4th Generation wRfx: NONREACTIVE

## 2018-10-27 SURGERY — CYSTOSCOPY
Anesthesia: General | Site: Bladder

## 2018-10-27 MED ORDER — FLUOXETINE HCL 10 MG PO CAPS
10.0000 mg | ORAL_CAPSULE | Freq: Every day | ORAL | Status: DC
  Administered 2018-10-27 – 2018-10-28 (×2): 10 mg via ORAL
  Filled 2018-10-27 (×2): qty 1

## 2018-10-27 MED ORDER — SODIUM CHLORIDE 0.9 % IV SOLN
1.0000 g | INTRAVENOUS | Status: DC
  Administered 2018-10-27 (×2): 1 g via INTRAVENOUS
  Filled 2018-10-27: qty 1
  Filled 2018-10-27: qty 10

## 2018-10-27 MED ORDER — EPHEDRINE 5 MG/ML INJ
INTRAVENOUS | Status: AC
  Filled 2018-10-27: qty 10

## 2018-10-27 MED ORDER — PHENYLEPHRINE 40 MCG/ML (10ML) SYRINGE FOR IV PUSH (FOR BLOOD PRESSURE SUPPORT)
PREFILLED_SYRINGE | INTRAVENOUS | Status: AC
  Filled 2018-10-27: qty 10

## 2018-10-27 MED ORDER — ALPRAZOLAM 0.5 MG PO TABS
1.0000 mg | ORAL_TABLET | Freq: Three times a day (TID) | ORAL | Status: DC
  Administered 2018-10-27 – 2018-10-28 (×3): 1 mg via ORAL
  Filled 2018-10-27 (×5): qty 2

## 2018-10-27 MED ORDER — SODIUM CHLORIDE 0.9 % IV SOLN
1.0000 g | Freq: Once | INTRAVENOUS | Status: DC
  Filled 2018-10-27: qty 10

## 2018-10-27 MED ORDER — SUGAMMADEX SODIUM 200 MG/2ML IV SOLN
INTRAVENOUS | Status: DC | PRN
  Administered 2018-10-27: 150 mg via INTRAVENOUS

## 2018-10-27 MED ORDER — DEXAMETHASONE SODIUM PHOSPHATE 10 MG/ML IJ SOLN
INTRAMUSCULAR | Status: DC | PRN
  Administered 2018-10-27: 4 mg via INTRAVENOUS

## 2018-10-27 MED ORDER — MIDAZOLAM HCL 5 MG/5ML IJ SOLN
INTRAMUSCULAR | Status: DC | PRN
  Administered 2018-10-27: 2 mg via INTRAVENOUS

## 2018-10-27 MED ORDER — DIPHENHYDRAMINE HCL 50 MG/ML IJ SOLN
INTRAMUSCULAR | Status: AC
  Filled 2018-10-27: qty 1

## 2018-10-27 MED ORDER — ADULT MULTIVITAMIN W/MINERALS CH
1.0000 | ORAL_TABLET | Freq: Every day | ORAL | Status: DC
  Administered 2018-10-27 – 2018-10-28 (×2): 1 via ORAL
  Filled 2018-10-27 (×2): qty 1

## 2018-10-27 MED ORDER — DEXAMETHASONE SODIUM PHOSPHATE 10 MG/ML IJ SOLN
INTRAMUSCULAR | Status: AC
  Filled 2018-10-27: qty 1

## 2018-10-27 MED ORDER — TRAMADOL HCL 50 MG PO TABS
50.0000 mg | ORAL_TABLET | Freq: Four times a day (QID) | ORAL | Status: DC | PRN
  Administered 2018-10-27 (×2): 50 mg via ORAL
  Filled 2018-10-27 (×2): qty 1

## 2018-10-27 MED ORDER — EPHEDRINE SULFATE 50 MG/ML IJ SOLN
INTRAMUSCULAR | Status: DC | PRN
  Administered 2018-10-27: 10 mg via INTRAVENOUS

## 2018-10-27 MED ORDER — PROPOFOL 10 MG/ML IV BOLUS
INTRAVENOUS | Status: DC | PRN
  Administered 2018-10-27: 140 mg via INTRAVENOUS

## 2018-10-27 MED ORDER — DIPHENHYDRAMINE HCL 50 MG/ML IJ SOLN
INTRAMUSCULAR | Status: DC | PRN
  Administered 2018-10-27: 12.5 mg via INTRAVENOUS

## 2018-10-27 MED ORDER — LACTATED RINGERS IV SOLN
INTRAVENOUS | Status: DC
  Administered 2018-10-27 (×2): via INTRAVENOUS

## 2018-10-27 MED ORDER — FENTANYL CITRATE (PF) 100 MCG/2ML IJ SOLN
INTRAMUSCULAR | Status: DC | PRN
  Administered 2018-10-27 (×2): 50 ug via INTRAVENOUS

## 2018-10-27 MED ORDER — ONDANSETRON HCL 4 MG/2ML IJ SOLN
INTRAMUSCULAR | Status: AC
  Filled 2018-10-27: qty 2

## 2018-10-27 MED ORDER — ONDANSETRON HCL 4 MG/2ML IJ SOLN: 4.0000 mg | Freq: Once | INTRAMUSCULAR | Status: DC | PRN

## 2018-10-27 MED ORDER — PHENYLEPHRINE HCL (PRESSORS) 10 MG/ML IV SOLN
INTRAVENOUS | Status: AC
  Filled 2018-10-27: qty 1

## 2018-10-27 MED ORDER — FERROUS SULFATE 325 (65 FE) MG PO TABS
325.0000 mg | ORAL_TABLET | ORAL | Status: DC
  Administered 2018-10-28: 325 mg via ORAL
  Filled 2018-10-27: qty 1

## 2018-10-27 MED ORDER — ATORVASTATIN CALCIUM 80 MG PO TABS
80.0000 mg | ORAL_TABLET | Freq: Every day | ORAL | Status: DC
  Administered 2018-10-27 – 2018-10-28 (×2): 80 mg via ORAL
  Filled 2018-10-27 (×2): qty 1

## 2018-10-27 MED ORDER — ROCURONIUM BROMIDE 10 MG/ML (PF) SYRINGE
PREFILLED_SYRINGE | INTRAVENOUS | Status: AC
  Filled 2018-10-27: qty 10

## 2018-10-27 MED ORDER — LIDOCAINE 2% (20 MG/ML) 5 ML SYRINGE
INTRAMUSCULAR | Status: AC
  Filled 2018-10-27: qty 5

## 2018-10-27 MED ORDER — METOPROLOL TARTRATE 50 MG PO TABS
50.0000 mg | ORAL_TABLET | Freq: Two times a day (BID) | ORAL | Status: DC
  Administered 2018-10-27 – 2018-10-28 (×3): 50 mg via ORAL
  Filled 2018-10-27 (×3): qty 1

## 2018-10-27 MED ORDER — PHENYLEPHRINE HCL (PRESSORS) 10 MG/ML IV SOLN
INTRAVENOUS | Status: DC | PRN
  Administered 2018-10-27: 80 ug via INTRAVENOUS
  Administered 2018-10-27: 40 ug via INTRAVENOUS
  Administered 2018-10-27: 80 ug via INTRAVENOUS

## 2018-10-27 MED ORDER — STERILE WATER FOR INJECTION IJ SOLN
INTRAMUSCULAR | Status: AC
  Filled 2018-10-27: qty 10

## 2018-10-27 MED ORDER — FENTANYL CITRATE (PF) 100 MCG/2ML IJ SOLN: 25.0000 ug | INTRAMUSCULAR | Status: DC | PRN

## 2018-10-27 MED ORDER — LIDOCAINE 2% (20 MG/ML) 5 ML SYRINGE
INTRAMUSCULAR | Status: DC | PRN
  Administered 2018-10-27: 60 mg via INTRAVENOUS

## 2018-10-27 MED ORDER — MIDAZOLAM HCL 2 MG/2ML IJ SOLN
INTRAMUSCULAR | Status: AC
  Filled 2018-10-27: qty 2

## 2018-10-27 MED ORDER — MENTHOL 3 MG MT LOZG
1.0000 | LOZENGE | OROMUCOSAL | Status: DC | PRN
  Administered 2018-10-27: 3 mg via ORAL

## 2018-10-27 MED ORDER — HYDROCHLOROTHIAZIDE 25 MG PO TABS
25.0000 mg | ORAL_TABLET | Freq: Every day | ORAL | Status: DC
  Administered 2018-10-27 – 2018-10-28 (×2): 25 mg via ORAL
  Filled 2018-10-27 (×2): qty 1

## 2018-10-27 MED ORDER — PROPOFOL 10 MG/ML IV BOLUS
INTRAVENOUS | Status: AC
  Filled 2018-10-27: qty 20

## 2018-10-27 MED ORDER — SODIUM CHLORIDE 0.9 % IR SOLN
Status: DC | PRN
  Administered 2018-10-27 (×3): 3000 mL

## 2018-10-27 MED ORDER — ROCURONIUM BROMIDE 50 MG/5ML IV SOSY
PREFILLED_SYRINGE | INTRAVENOUS | Status: DC | PRN
  Administered 2018-10-27: 40 mg via INTRAVENOUS

## 2018-10-27 MED ORDER — FENTANYL CITRATE (PF) 250 MCG/5ML IJ SOLN
INTRAMUSCULAR | Status: AC
  Filled 2018-10-27: qty 5

## 2018-10-27 MED ORDER — MENTHOL 3 MG MT LOZG
LOZENGE | OROMUCOSAL | Status: AC
  Filled 2018-10-27: qty 9

## 2018-10-27 MED ORDER — ONDANSETRON HCL 4 MG/2ML IJ SOLN
INTRAMUSCULAR | Status: DC | PRN
  Administered 2018-10-27: 4 mg via INTRAVENOUS

## 2018-10-27 MED ORDER — BENAZEPRIL HCL 10 MG PO TABS
10.0000 mg | ORAL_TABLET | Freq: Every day | ORAL | Status: DC
  Filled 2018-10-27 (×2): qty 1

## 2018-10-27 SURGICAL SUPPLY — 29 items
BAG URINE LEG 500ML (DRAIN) ×3 IMPLANT
BAG URO CATCHER STRL LF (MISCELLANEOUS) ×3 IMPLANT
CATH INTERMIT  6FR 70CM (CATHETERS) IMPLANT
CATH URET 5FR 28IN CONE TIP (BALLOONS)
CATH URET 5FR 28IN OPEN ENDED (CATHETERS) IMPLANT
CATH URET 5FR 70CM CONE TIP (BALLOONS) IMPLANT
COVER WAND RF STERILE (DRAPES) IMPLANT
ELECT REM PT RETURN 9FT ADLT (ELECTROSURGICAL) ×3
ELECTRODE REM PT RTRN 9FT ADLT (ELECTROSURGICAL) ×1 IMPLANT
GLOVE BIO SURGEON STRL SZ7.5 (GLOVE) ×3 IMPLANT
GOWN STRL REUS W/ TWL LRG LVL3 (GOWN DISPOSABLE) IMPLANT
GOWN STRL REUS W/ TWL XL LVL3 (GOWN DISPOSABLE) ×2 IMPLANT
GOWN STRL REUS W/TWL LRG LVL3 (GOWN DISPOSABLE)
GOWN STRL REUS W/TWL XL LVL3 (GOWN DISPOSABLE) ×4
GUIDEWIRE ANG ZIPWIRE 038X150 (WIRE) IMPLANT
GUIDEWIRE STR DUAL SENSOR (WIRE) IMPLANT
IV NS IRRIG 3000ML ARTHROMATIC (IV SOLUTION) ×3 IMPLANT
KIT TURNOVER KIT B (KITS) ×3 IMPLANT
LOOP CUT BIPOLAR 24F LRG (ELECTROSURGICAL) ×3 IMPLANT
MANIFOLD NEPTUNE II (INSTRUMENTS) IMPLANT
NEEDLE HYPO 22GX1.5 SAFETY (NEEDLE) IMPLANT
NS IRRIG 1000ML POUR BTL (IV SOLUTION) ×3 IMPLANT
PACK CYSTO (CUSTOM PROCEDURE TRAY) ×3 IMPLANT
SET IRRIG Y TYPE TUR BLADDER L (SET/KITS/TRAYS/PACK) ×6 IMPLANT
SOL PREP POV-IOD 4OZ 10% (MISCELLANEOUS) ×3 IMPLANT
SYRINGE TOOMEY DISP (SYRINGE) ×3 IMPLANT
TUBE CONNECTING 12'X1/4 (SUCTIONS)
TUBE CONNECTING 12X1/4 (SUCTIONS) IMPLANT
WATER STERILE IRR 3000ML UROMA (IV SOLUTION) ×3 IMPLANT

## 2018-10-27 NOTE — Plan of Care (Signed)
  Problem: Health Behavior/Discharge Planning: Goal: Ability to manage health-related needs will improve Outcome: Progressing   Problem: Clinical Measurements: Goal: Diagnostic test results will improve Outcome: Progressing   Problem: Elimination: Goal: Will not experience complications related to urinary retention Outcome: Progressing   Problem: Clinical Measurements: Goal: Diagnostic test results will improve Outcome: Progressing
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
## 2018-10-27 NOTE — Anesthesia Postprocedure Evaluation (Signed)
Anesthesia Post Note  Patient: Jaclyn Hart  Procedure(s) Performed: CYSTOSCOPY WITH CLOT EVACUATION, TRANS URETHRAL RESECTION OF BLADDER TUMOR GREATER THAN FIVE (N/A Bladder)     Patient location during evaluation: PACU Anesthesia Type: General Level of consciousness: awake and alert Pain management: pain level controlled Vital Signs Assessment: post-procedure vital signs reviewed and stable Respiratory status: spontaneous breathing, nonlabored ventilation, respiratory function stable and patient connected to nasal cannula oxygen Cardiovascular status: blood pressure returned to baseline and stable Postop Assessment: no apparent nausea or vomiting Anesthetic complications: no    Last Vitals:  Vitals:   10/27/18 1703 10/27/18 2004  BP: 118/72 107/75  Pulse: 76 77  Resp: 18 16  Temp: 36.7 C 36.9 C  SpO2: 96% 98%    Last Pain:  Vitals:   10/27/18 2004  TempSrc: Oral  PainSc:                  Catalina Gravel

## 2018-10-27 NOTE — Discharge Instructions (Signed)
Indwelling Urinary Catheter Care, Adult °An indwelling urinary catheter is a thin tube that is put into your bladder. The tube helps to drain pee (urine) out of your body. The tube goes in through your urethra. Your urethra is where pee comes out of your body. Your pee will come out through the catheter, then it will go into a bag (drainage bag). °Take good care of your catheter so it will work well. °How to wear your catheter and bag °Supplies needed °· Sticky tape (adhesive tape) or a leg strap. °· Alcohol wipe or soap and water (if you use tape). °· A clean towel (if you use tape). °· Large overnight bag. °· Smaller bag (leg bag). °Wearing your catheter °Attach your catheter to your leg with tape or a leg strap. °· Make sure the catheter is not pulled tight. °· If a leg strap gets wet, take it off and put on a dry strap. °· If you use tape to hold the bag on your leg: °1. Use an alcohol wipe or soap and water to wash your skin where the tape made it sticky before. °2. Use a clean towel to pat-dry that skin. °3. Use new tape to make the bag stay on your leg. °Wearing your bags °You should have been given a large overnight bag. °· You may wear the overnight bag in the day or night. °· Always have the overnight bag lower than your bladder.  Do not let the bag touch the floor. °· Before you go to sleep, put a clean plastic bag in a wastebasket. Then hang the overnight bag inside the wastebasket. °You should also have a smaller leg bag that fits under your clothes. °· Always wear the leg bag below your knee. °· Do not wear your leg bag at night. °How to care for your skin and catheter °Supplies needed °· A clean washcloth. °· Water and mild soap. °· A clean towel. °Caring for your skin and catheter ° °  ° °· Clean the skin around your catheter every day: °? Wash your hands with soap and water. °? Wet a clean washcloth in warm water and mild soap. °? Clean the skin around your urethra. °? If you are female: °? Gently  spread the folds of skin around your vagina (labia). °? With the washcloth in your other hand, wipe the inner side of your labia on each side. Wipe from front to back. °? If you are female: °? Pull back any skin that covers the end of your penis (foreskin). °? With the washcloth in your other hand, wipe your penis in small circles. Start wiping at the tip of your penis, then move away from the catheter. °? Move the foreskin back in place, if needed. °? With your free hand, hold the catheter close to where it goes into your body. °? Keep holding the catheter during cleaning so it does not get pulled out. °? With the washcloth in your other hand, clean the catheter. °? Only wipe downward on the catheter. °? Do not wipe upward toward your body. Doing this may push germs into your urethra and cause infection. °? Use a clean towel to pat-dry the catheter and the skin around it. Make sure to wipe off all soap. °? Wash your hands with soap and water. °· Shower every day. Do not take baths. °· Do not use cream, ointment, or lotion on the area where the catheter goes into your body, unless your doctor tells you   to. °· Do not use powders, sprays, or lotions on your genital area. °· Check your skin around the catheter every day for signs of infection. Check for: °? Redness, swelling, or pain. °? Fluid or blood. °? Warmth. °? Pus or a bad smell. °How to empty the bag °Supplies needed °· Rubbing alcohol. °· Gauze pad or cotton ball. °· Tape or a leg strap. °Emptying the bag °Pour the pee out of your bag when it is ?-½ full, or at least 2-3 times a day. Do this for your overnight bag and your leg bag. °1. Wash your hands with soap and water. °2. Separate (detach) the bag from your leg. °3. Hold the bag over the toilet or a clean pail. Keep the bag lower than your hips and bladder. This is so the pee (urine) does not go back into the tube. °4. Open the pour spout. It is at the bottom of the bag. °5. Empty the pee into the toilet or  pail. Do not let the pour spout touch any surface. °6. Put rubbing alcohol on a gauze pad or cotton ball. °7. Use the gauze pad or cotton ball to clean the pour spout. °8. Close the pour spout. °9. Attach the bag to your leg with tape or a leg strap. °10. Wash your hands with soap and water. °Follow instructions for cleaning the drainage bag: °· From the product maker. °· As told by your doctor. °How to change the bag °Supplies needed °· Alcohol wipes. °· A clean bag. °· Tape or a leg strap. °Changing the bag °Replace your bag when it starts to leak, smell bad, or look dirty. °1. Wash your hands with soap and water. °2. Separate the dirty bag from your leg. °3. Pinch the catheter with your fingers so that pee does not spill out. °4. Separate the catheter tube from the bag tube where these tubes connect (at the connection valve). Do not let the tubes touch any surface. °5. Clean the end of the catheter tube with an alcohol wipe. Use a different alcohol wipe to clean the end of the bag tube. °6. Connect the catheter tube to the tube of the clean bag. °7. Attach the clean bag to your leg with tape or a leg strap. Do not make the bag tight on your leg. °8. Wash your hands with soap and water. °General rules ° °· Never pull on your catheter. Never try to take it out. Doing that can hurt you. °· Always wash your hands before and after you touch your catheter or bag. Use a mild, fragrance-free soap. If you do not have soap and water, use hand sanitizer. °· Always make sure there are no twists or bends (kinks) in the catheter tube. °· Always make sure there are no leaks in the catheter or bag. °· Drink enough fluid to keep your pee pale yellow. °· Do not take baths, swim, or use a hot tub. °· If you are female, wipe from front to back after you poop (have a bowel movement). °Contact a doctor if: °· Your pee is cloudy. °· Your pee smells worse than usual. °· Your catheter gets clogged. °· Your catheter leaks. °· Your bladder  feels full. °Get help right away if: °· You have redness, swelling, or pain where the catheter goes into your body. °· You have fluid, blood, pus, or a bad smell coming from the area where the catheter goes into your body. °· Your skin feels warm where   the catheter goes into your body.  You have a fever.  You have pain in your: ? Belly (abdomen). ? Legs. ? Lower back. ? Bladder.  You see blood in the catheter.  Your pee is pink or red.  You feel sick to your stomach (nauseous).  You throw up (vomit).  You have chills.  Your pee is not draining into the bag.  Your catheter gets pulled out. Summary  An indwelling urinary catheter is a thin tube that is placed into the bladder to help drain pee (urine) out of the body.  The catheter is placed into the part of the body that drains pee from the bladder (urethra).  Taking good care of your catheter will keep it working properly and help prevent problems.  Always wash your hands before and after touching your catheter or bag.  Never pull on your catheter or try to take it out. This information is not intended to replace advice given to you by your health care provider. Make sure you discuss any questions you have with your health care provider. Document Released: 06/10/2012 Document Revised: 06/07/2018 Document Reviewed: 09/29/2016 Elsevier Patient Education  [employer].   Transurethral Resection of Bladder Wall, Care After This sheet gives you information about how to care for yourself after your procedure. Your health care provider may also give you more specific instructions. If you have problems or questions, contact your health care provider. What can I expect after the procedure? After the procedure, it is common to have:  A small amount of blood in your urine for up to 2 weeks.  Soreness or mild pain from your catheter. After your catheter is removed, you may have mild soreness, especially when urinating.  Pain  in your lower abdomen. Follow these instructions at home: Medicines   Take over-the-counter and prescription medicines only as told by your health care provider.  If you were prescribed an antibiotic medicine, take it as told by your health care provider. Do not stop taking the antibiotic even if you start to feel better.  Do not drive for 24 hours if you were given a sedative during your procedure.  Ask your health care provider if the medicine prescribed to you: ? Requires you to avoid driving or using heavy machinery. ? Can cause constipation. You may need to take these actions to prevent or treat constipation:  Take over-the-counter or prescription medicines.  Eat foods that are high in fiber, such as beans, whole grains, and fresh fruits and vegetables.  Limit foods that are high in fat and processed sugars, such as fried or sweet foods. Activity  Return to your normal activities as told by your health care provider. Ask your health care provider what activities are safe for you.  Do not lift anything that is heavier than 10 lb (4.5 kg), or the limit that you are told, until your health care provider says that it is safe.  Avoid intense physical activity for as long as told by your health care provider.  Rest as told by your health care provider.  Avoid sitting for a long time without moving. Get up to take short walks every 1-2 hours. This is important to improve blood flow and breathing. Ask for help if you feel weak or unsteady. General instructions   Do not drink alcohol for as long as told by your health care provider. This is especially important if you are taking prescription pain medicines.  Do not take baths, swim,  or use a hot tub until your health care provider approves. Ask your health care provider if you may take showers. You may only be allowed to take sponge baths.  If you have a catheter, follow instructions from your health care provider about caring for  your catheter and your drainage bag.  Drink enough fluid to keep your urine pale yellow.  Wear compression stockings as told by your health care provider. These stockings help to prevent blood clots and reduce swelling in your legs.  Keep all follow-up visits as told by your health care provider. This is important. ? You will need to be followed closely with regular checks of your bladder and urethra (cystoscopies) to make sure that the cancer does not come back. Contact a health care provider if:  You have pain that gets worse or does not improve with medicine.  You have blood in your urine for more than 2 weeks.  You have cloudy or bad-smelling urine.  You become constipated. Signs of constipation may include having: ? Fewer than three bowel movements in a week. ? Difficulty having a bowel movement. ? Stools that are dry, hard, or larger than normal.  You have a fever. Get help right away if:  You have: ? Severe pain. ? Bright red blood in your urine. ? Blood clots in your urine. ? A lot of blood in your urine.  Your catheter has been removed and you are not able to urinate.  You have a catheter in place and the catheter is not draining urine. Summary  After your procedure, it is common to have a small amount of blood in your urine, soreness or mild pain from your catheter, and pain in your lower abdomen.  Take over-the-counter and prescription medicines only as told by your health care provider.  Rest as told by your health care provider. Follow your health care provider's instructions about returning to normal activities. Ask what activities are safe for you.  If you have a catheter, follow instructions from your health care provider about caring for your catheter and your drainage bag.  Get help right away if you cannot urinate, you have severe pain, or you have bright red blood or blood clots in your urine. This information is not intended to replace advice given to  you by your health care provider. Make sure you discuss any questions you have with your health care provider. Document Released: 01/25/2015 Document Revised: 09/13/2017 Document Reviewed: 09/13/2017 Elsevier Patient Education  2020 [employer].

## 2018-10-27 NOTE — Transfer of Care (Signed)
Immediate Anesthesia Transfer of Care Note  Patient: Jaclyn Hart  Procedure(s) Performed: CYSTOSCOPY WITH CLOT EVACUATION POSSOBLE TRANS URETHRAL RESECTION OF BLADDER TUMOR (N/A )  Patient Location: PACU  Anesthesia Type:General  Level of Consciousness: awake, alert  and oriented  Airway & Oxygen Therapy: Patient Spontanous Breathing and Patient connected to nasal cannula oxygen  Post-op Assessment: Report given to RN and Post -op Vital signs reviewed and stable  Post vital signs: Reviewed and stable  Last Vitals:  Vitals Value Taken Time  BP    Temp    Pulse 74 10/27/18 1234  Resp 12 10/27/18 1234  SpO2 100 % 10/27/18 1234  Vitals shown include unvalidated device data.  Last Pain:  Vitals:   10/27/18 0946  TempSrc:   PainSc: 5       Patients Stated Pain Goal: 3 ([id number])  Complications: No apparent anesthesia complications

## 2018-10-27 NOTE — Progress Notes (Signed)
PROGRESS NOTE    Jaclyn Hart  [id number] DOB: Apr 22, 1950 DOA: 10/26/2018 PCP: Neale Burly, MD   Brief Narrative:  Patient is a 68 year old female with history of hypertension, CVA, headache, GERD, anxiety who presents with gross hematuria starting a day before admission.  She was recently treated for urinary tract infection with antibiotics by her PCP.  CT abdomen/pelvis showed large filling defect within the dependent portion of the urinary bladder consistent with a clot and anterior bladder wall thickening.  Patient admitted for further management.  Urology consulted and she underwent TURBT today.  Assessment & Plan:   Principal Problem:   Hematuria Active Problems:   Hypertension   Gross hematuria: Urology consulted and following.  Status post TURBT.H and H stable.  Suspected UTI: Urine analysis did not show leucocytes.  Continue Rocephin 1 g daily for now. Will check with urology if she should be continued on antibiotics.  Hypertension: Current blood pressure stable.  Home medicines resumed  Hyperlipidemia: Continue Lipitor  GERD: Continue PPI  Anxiety: Continue home medicines.  On fluoxetine and Xanax  Anemia: Continue iron supplementation         DVT prophylaxis: SCD Code Status: Full Family Communication: None present at the bedside Disposition Plan: Home after urology clearance   Consultants: Urology  Procedures: TURBT  Antimicrobials:  Anti-infectives (From admission, onward)   Start     Dose/Rate Route Frequency Ordered Stop   10/27/18 2000  [MAR Hold]  cefTRIAXone (ROCEPHIN) 1 g in sodium chloride 0.9 % 100 mL IVPB     (MAR Hold since Sun 10/27/2018 at [facility].Hold Reason: Transfer to a Procedural area.)   1 g 200 mL/hr over 30 Minutes Intravenous Every 24 hours 10/27/18 0142     10/27/18 1145  cefTRIAXone (ROCEPHIN) 1 g in sodium chloride 0.9 % 100 mL IVPB     1 g 200 mL/hr over 30 Minutes Intravenous  Once 10/27/18 1133     10/26/18 2130   cefTRIAXone (ROCEPHIN) 1 g in sodium chloride 0.9 % 100 mL IVPB     1 g 200 mL/hr over 30 Minutes Intravenous  Once 10/26/18 2125 10/26/18 2215      Subjective:  Patient seen and examined the bedside this morning.  Hemodynamically stable.  Found to be comfortable.  Denies any abdominal pain.  Foley bag was draining bloody urine.  Objective: Vitals:   10/27/18 0428 10/27/18 0915 10/27/18 1228 10/27/18 1245  BP: 109/70 117/74 114/68 101/74  Pulse: 81 81 76 73  Resp: 18 18 15 14   Temp: 98.7 F (37.1 C) 98.5 F (36.9 C) 97.7 F (36.5 C) 97.7 F (36.5 C)  TempSrc: Oral Oral    SpO2: 97% 97% 100% 100%  Weight:      Height:        Intake/Output Summary (Last 24 hours) at 10/27/2018 1251 Last data filed at 10/27/2018 1222 Gross per 24 hour  Intake 1607.29 ml  Output 750 ml  Net 857.29 ml   Filed Weights   10/26/18 1752  Weight: 61.2 kg    Examination:  General exam: Appears calm and comfortable ,Not in distress,average built HEENT:PERRL,Oral mucosa moist, Ear/Nose normal on gross exam Respiratory system: Bilateral equal air entry, normal vesicular breath sounds, no wheezes or crackles  Cardiovascular system: S1 & S2 heard, RRR. No JVD, murmurs, rubs, gallops or clicks. No pedal edema. Gastrointestinal system: Abdomen is nondistended, soft and nontender. No organomegaly or masses felt. Normal bowel sounds heard. Central nervous system: Alert and oriented.  No focal neurological deficits. Extremities: No edema, no clubbing ,no cyanosis, distal peripheral pulses palpable. Skin: No rashes, lesions or ulcers,no icterus ,no pallor GU: Blood urine noted in foley bag    Data Reviewed: I have personally reviewed following labs and imaging studies  CBC: Recent Labs  Lab 10/26/18 1948 10/27/18 0501  WBC 9.3 7.5  NEUTROABS 5.9  --   HGB 10.3* 9.5*  HCT 35.5* 31.4*  MCV 107.6* 104.0*  PLT 299 [id number]   Basic Metabolic Panel: Recent Labs  Lab 10/26/18 1948 10/27/18 0501  NA  142 142  K 5.1 4.1  CL 116* 116*  CO2 16* 16*  GLUCOSE 82 87  BUN 40* 33*  CREATININE 0.95 0.90  CALCIUM 8.5* 8.5*   GFR: Estimated Creatinine Clearance: 49.5 mL/min (by C-G formula based on SCr of 0.9 mg/dL). Liver Function Tests: Recent Labs  Lab 10/26/18 1948 10/27/18 0501  AST 16 13*  ALT 18 17  ALKPHOS 97 93  BILITOT 0.5 0.4  PROT 6.0* 5.1*  ALBUMIN 3.2* 2.8*   No results for input(s): LIPASE, AMYLASE in the last 168 hours. No results for input(s): AMMONIA in the last 168 hours. Coagulation Profile: No results for input(s): INR, PROTIME in the last 168 hours. Cardiac Enzymes: No results for input(s): CKTOTAL, CKMB, CKMBINDEX, TROPONINI in the last 168 hours. BNP (last 3 results) No results for input(s): PROBNP in the last 8760 hours. HbA1C: No results for input(s): HGBA1C in the last 72 hours. CBG: No results for input(s): GLUCAP in the last 168 hours. Lipid Profile: No results for input(s): CHOL, HDL, LDLCALC, TRIG, CHOLHDL, LDLDIRECT in the last 72 hours. Thyroid Function Tests: No results for input(s): TSH, T4TOTAL, FREET4, T3FREE, THYROIDAB in the last 72 hours. Anemia Panel: No results for input(s): VITAMINB12, FOLATE, FERRITIN, TIBC, IRON, RETICCTPCT in the last 72 hours. Sepsis Labs: No results for input(s): PROCALCITON, LATICACIDVEN in the last 168 hours.  Recent Results (from the past 240 hour(s))  SARS CORONAVIRUS 2 (TAT 6-12 HRS) Nasal Swab Aptima Multi Swab     Status: None   Collection Time: 10/26/18 10:56 PM   Specimen: Aptima Multi Swab; Nasal Swab  Result Value Ref Range Status   SARS Coronavirus 2 NEGATIVE NEGATIVE Final    Comment: (NOTE) SARS-CoV-2 target nucleic acids are NOT DETECTED. The SARS-CoV-2 RNA is generally detectable in upper and lower respiratory specimens during the acute phase of infection. Negative results do not preclude SARS-CoV-2 infection, do not rule out co-infections with other pathogens, and should not be used as  the sole basis for treatment or other patient management decisions. Negative results must be combined with clinical observations, patient history, and epidemiological information. The expected result is Negative. Fact Sheet for Patients: SugarRoll.be Fact Sheet for Healthcare Providers: https://www.woods-mathews.com/ This test is not yet approved or cleared by the [place] FDA and  has been authorized for detection and/or diagnosis of SARS-CoV-2 by FDA under an Emergency Use Authorization (EUA). This EUA will remain  in effect (meaning this test can be used) for the duration of the COVID-19 declaration under Section 56 4(b)(1) of the Act, 21 U.S.C. section 360bbb-3(b)(1), unless the authorization is terminated or revoked sooner. Performed at [facility], [place] [address] [place], [place] [address]          Radiology Studies: Ct Renal Stone Study  Result Date: 10/26/2018 CLINICAL DATA:  Hematuria with an unknown cause. EXAM: CT ABDOMEN AND PELVIS WITHOUT CONTRAST TECHNIQUE: Multidetector CT imaging of the  abdomen and pelvis was performed following the standard protocol without IV contrast. COMPARISON:  October 11, 2018 FINDINGS: Lower chest: The lung bases are clear. The heart size is normal. Hepatobiliary: The liver is normal. Status post cholecystectomy.There is no biliary ductal dilation. Pancreas: Normal contours without ductal dilatation. No peripancreatic fluid collection. Spleen: No splenic laceration or hematoma. Adrenals/Urinary Tract: --Adrenal glands: Again noted is a right-sided adrenal mass, similar to prior study. --Right kidney/ureter: There is a lobular appearance of the right kidney. There is no hydronephrosis. There is likely cortical thinning of the upper pole the right kidney. No radiopaque obstructing stones. --Left kidney/ureter: The left kidney is atrophic and somewhat lobular, similar to prior study. There is no  left-sided hydronephrosis. --Urinary bladder: Again noted is some bladder wall thickening. There is a hyperdense filling defect within the dependent portion of the urinary bladder. This filling defect measures approximately 7.1 x 4.7 cm. Stomach/Bowel: --Stomach/Duodenum: There is a large hiatal hernia. --Small bowel: No dilatation or inflammation. --Colon: No focal abnormality. --Appendix: Surgically absent. Vascular/Lymphatic: Atherosclerotic calcification is present within the non-aneurysmal abdominal aorta, without hemodynamically significant stenosis. --No retroperitoneal lymphadenopathy. --No mesenteric lymphadenopathy. --No pelvic or inguinal lymphadenopathy. Reproductive: Unremarkable Other: No ascites or free air. The abdominal wall is normal. Musculoskeletal. There is a pars defect at L5 resulting in grade 2-3 anterolisthesis of L5 on S1. There is stable vertebral plana of the L2 vertebral body. IMPRESSION: 1. Large 7.1 x 4.7 cm filling defect within the dependent portion of the urinary bladder. This is favored to represent a blood clot given its short interval development. Again noted is nonspecific bladder wall thickening. 2. Multiple additional chronic findings as detailed above, not significantly changed from CT dated 10/11/2018. Electronically Signed   By: Constance Holster M.D.   On: 10/26/2018 19:24        Scheduled Meds: . [MAR Hold] ALPRAZolam  1 mg Oral Q8H  . [MAR Hold] atorvastatin  80 mg Oral Daily  . [MAR Hold] benazepril  10 mg Oral Daily  . [MAR Hold] ferrous sulfate  325 mg Oral [id number]  . [MAR Hold] FLUoxetine  10 mg Oral Daily  . [MAR Hold] hydrochlorothiazide  25 mg Oral Daily  . menthol-cetylpyridinium      . [MAR Hold] metoprolol tartrate  50 mg Oral BID  . [MAR Hold] multivitamin with minerals  1 tablet Oral Daily   Continuous Infusions: . [MAR Hold] cefTRIAXone (ROCEPHIN)  IV    . cefTRIAXone (ROCEPHIN)  IV    . lactated ringers 10 mL/hr at 10/27/18 1003      LOS: 1 day    Time spent: More than 50% of that time was spent in counseling and/or coordination of care.      Shelly Coss, MD [facility] Pager [phone]  If 7PM-7AM, please contact night-coverage [email or web address] Password [email or web address] 10/27/2018, 12:51 PM

## 2018-10-27 NOTE — Anesthesia Procedure Notes (Signed)
Procedure Name: Intubation Date/Time: 10/27/2018 11:04 AM Performed by: Inda Coke, CRNA Pre-anesthesia Checklist: Patient identified, Emergency Drugs available, Suction available and Patient being monitored Patient Re-evaluated:Patient Re-evaluated prior to induction Oxygen Delivery Method: Circle System Utilized Preoxygenation: Pre-oxygenation with 100% oxygen Induction Type: IV induction Ventilation: Mask ventilation without difficulty Laryngoscope Size: Mac and 3 Grade View: Grade I Tube type: Oral Tube size: 7.0 mm Number of attempts: 1 Airway Equipment and Method: Stylet and Oral airway Placement Confirmation: ETT inserted through vocal cords under direct vision,  positive ETCO2 and breath sounds checked- equal and bilateral Secured at: 22 cm Tube secured with: Tape Dental Injury: Teeth and Oropharynx as per pre-operative assessment

## 2018-10-27 NOTE — Anesthesia Preprocedure Evaluation (Addendum)
Anesthesia Evaluation  Patient identified by MRN, date of birth, ID band Patient awake    Reviewed: Allergy & Precautions, NPO status , Patient's Chart, lab work & pertinent test results, reviewed documented beta blocker date and time   History of Anesthesia Complications (+) PONV and history of anesthetic complications  Airway Mallampati: II  TM Distance: >3 FB Neck ROM: Full    Dental  (+) Dental Advisory Given, Upper Dentures, Edentulous Lower   Pulmonary sleep apnea , Current Smoker and Patient abstained from smoking.,    Pulmonary exam normal breath sounds clear to auscultation       Cardiovascular hypertension, Pt. on home beta blockers and Pt. on medications (-) angina+ CAD, + Past MI and + Cardiac Stents  Normal cardiovascular exam Rhythm:Regular Rate:Normal     Neuro/Psych  Headaches, PSYCHIATRIC DISORDERS Anxiety Depression CVA    GI/Hepatic Neg liver ROS, hiatal hernia, GERD  ,  Endo/Other  negative endocrine ROS  Renal/GU negative Renal ROS   Hematuria     Musculoskeletal  (+) Arthritis ,   Abdominal   Peds  Hematology  (+) Blood dyscrasia, anemia ,   Anesthesia Other Findings Day of surgery medications reviewed with the patient.  Reproductive/Obstetrics                           Anesthesia Physical Anesthesia Plan  ASA: III and emergent  Anesthesia Plan: General   Post-op Pain Management:    Induction: Intravenous  PONV Risk Score and Plan: 4 or greater and Dexamethasone, Ondansetron and Treatment may vary due to age or medical condition  Airway Management Planned: LMA  Additional Equipment:   Intra-op Plan:   Post-operative Plan: Extubation in OR  Informed Consent: I have reviewed the patients History and Physical, chart, labs and discussed the procedure including the risks, benefits and alternatives for the proposed anesthesia with the patient or authorized  representative who has indicated his/her understanding and acceptance.     Dental advisory given  Plan Discussed with: CRNA  Anesthesia Plan Comments:        Anesthesia Quick Evaluation

## 2018-10-27 NOTE — Op Note (Signed)
Preoperative diagnosis: Gross hematuria, bladder wall thickening Postoperative diagnosis: Same  Procedure: TURBT greater than 5 cm  Surgeon: Junious Silk  Anesthesia: General  Indication for procedure: Ms. Mcdaniel is a 68 year old female who developed gross hematuria a couple of days ago.  She had anterior bladder wall thickening and clots in her bladder and was brought for TURBT.  Of note the patient's had 48 hours of gross hematuria, but after the procedure when I called the daughter she said the patient has had gross hematuria for about 3 weeks and has had urgency and urge incontinence.  Findings: On exam under anesthesia there is moderate atrophy, enlarged introitus, grade 2 cystocele.  On bimanual exam there are no palpable bladder masses.  The bladder is mobile.  Bladder and the urethra palpably normal.  On cystoscopy there was a U-shaped thickening that came from right anterior toward the dome spread along most of the anterior bladder wall and then fanned out again left anterior back up toward the dome.  There was no obvious tumor.  This may be severe inflammation versus a more flat tumor.  There was a rather abrupt transition from the trigone posterior and up toward the dome smooth and then transition to this thickened anterior segment.  The trigone and the ureteral orifice ease appeared normal.  There was clear E flux bilaterally.  Description of procedure: After consent was obtained patient brought to the operating room.  After adequate anesthesia she was placed in lithotomy position and prepped and draped in usual sterile fashion.  A timeout was performed to confirm the patient and procedure.  An exam under anesthesia was performed.  The continuous flow sheath was passed with the visual obturator.  There was about 200 cc of clot in the bladder and this was evacuated.  Once cleared I was able to get a good look at the bladder.  There was some oozing from the left anterior bladder wall  thickening.  I started over on the left side and resected starting from the transition from smooth to thickened anteriorly and brought this down toward the bladder neck.  I went from left over the right.  This resected a good portion of the surface of the thickened area.  On the left side I could see some good muscle bundles on the right it seemed possibly more infiltrated.  Hemostasis was excellent at low pressure.  I felt like I got a good resection and sample for this first time.  She may need a staged procedure.  The chips were collected for pathology.  The scope was removed and an 58 French catheter placed in left to gravity drainage.  She was awakened and taken to the recovery room in stable condition.  Complications: None  Blood loss: Minimal  Specimens: Anterior bladder wall resection to pathology  Drains: 18 French catheter  Disposition: Patient stable to PACU

## 2018-10-28 ENCOUNTER — Encounter (HOSPITAL_COMMUNITY): Payer: Self-pay | Admitting: Urology

## 2018-10-28 LAB — CBC WITH DIFFERENTIAL/PLATELET
Abs Immature Granulocytes: 0.02 10*3/uL (ref 0.00–0.07)
Basophils Absolute: 0 10*3/uL (ref 0.0–0.1)
Basophils Relative: 0 %
Eosinophils Absolute: 0 10*3/uL (ref 0.0–0.5)
Eosinophils Relative: 0 %
HCT: 27.1 % — ABNORMAL LOW (ref 36.0–46.0)
Hemoglobin: 8.3 g/dL — ABNORMAL LOW (ref 12.0–15.0)
Immature Granulocytes: 0 %
Lymphocytes Relative: 20 %
Lymphs Abs: 1.5 10*3/uL (ref 0.7–4.0)
MCH: 31.4 pg (ref 26.0–34.0)
MCHC: 30.6 g/dL (ref 30.0–36.0)
MCV: 102.7 fL — ABNORMAL HIGH (ref 80.0–100.0)
Monocytes Absolute: 0.8 10*3/uL (ref 0.1–1.0)
Monocytes Relative: 10 %
Neutro Abs: 5.1 10*3/uL (ref 1.7–7.7)
Neutrophils Relative %: 70 %
Platelets: 256 10*3/uL (ref 150–400)
RBC: 2.64 MIL/uL — ABNORMAL LOW (ref 3.87–5.11)
RDW: 14.6 % (ref 11.5–15.5)
WBC: 7.5 10*3/uL (ref 4.0–10.5)
nRBC: 0 % (ref 0.0–0.2)

## 2018-10-28 MED ORDER — SULFAMETHOXAZOLE-TRIMETHOPRIM 400-80 MG PO TABS: 1.0000 | ORAL_TABLET | Freq: Two times a day (BID) | ORAL | 0 refills | Status: AC

## 2018-10-28 MED ORDER — BUTALBITAL-APAP-CAFFEINE 50-325-40 MG PO TABS: 1.0000 | ORAL_TABLET | Freq: Four times a day (QID) | ORAL | Status: DC | PRN

## 2018-10-28 MED ORDER — BUTALBITAL-APAP-CAFFEINE 50-325-40 MG PO TABS: 1.0000 | ORAL_TABLET | Freq: Four times a day (QID) | ORAL | 0 refills | Status: DC | PRN

## 2018-10-28 NOTE — Progress Notes (Signed)
DISCHARGE NOTE HOME Jaclyn Hart to be discharged Home per MD order. Discussed prescriptions and follow up appointments with the patient. Prescriptions given to patient; medication list explained in detail. Patient verbalized understanding.  Skin clean, dry and intact without evidence of skin break down, no evidence of skin tears noted. IV catheter discontinued intact. Site without signs and symptoms of complications. Dressing and pressure applied. Pt denies pain at the site currently. No complaints noted.  Patient free of lines, drains, and wounds. Pt DC with foley catheter per Urology order. Educated patient and daughter on how to clean, care and empty foley catheter.  An After Visit Summary (AVS) was printed and given to the patient. Patient escorted via wheelchair, and discharged home via private auto.  Paulla Fore, RN

## 2018-10-28 NOTE — Discharge Summary (Signed)
Physician Discharge Summary  Jaclyn Hart [id number] DOB: 1950/08/19 DOA: 10/26/2018  PCP: Neale Burly, MD  Admit date: 10/26/2018 Discharge date: 10/28/2018  Admitted From: Home Disposition:  Home  Discharge Condition:Stable CODE STATUS:FULL Diet recommendation: Heart Healthy   Brief/Interim Summary:  Patient is a 68 year old female with history of hypertension, CVA, headache, GERD, anxiety who presents with gross hematuria starting a day before admission.  She was recently treated for urinary tract infection with antibiotics by her PCP.  CT abdomen/pelvis showed large filling defect within the dependent portion of the urinary bladder consistent with a clot and anterior bladder wall thickening.  Patient admitted for further management.  Urology consulted and she underwent TURBT .  She remains hemodynamically stable.  Urine has cleared.  She will be discharged with Foley catheter after discussion with urology.  She will follow-up with Dr. Junious Silk as an outpatient in a week to remove the Foley.  Following problems were addressed during her hospitalization:  Gross hematuria: Urology consulted and was following.  Status post TURBT.H and H stable.  Will be discharged on Foley.  Foley removal after follow-up with urology.  Suspected UTI: Urine analysis did not show leucocytes. She will be continued on Bactrim for 5 days.  Headache: History of migraines.  Fioricet ordered  Hypertension: Current blood pressure stable.  Home medicines resumed  Hyperlipidemia: Continue Lipitor  GERD: Continue PPI  Anxiety: Continue home medicines.  On fluoxetine and Xanax  Anemia: Continue iron supplementation  Discharge Diagnoses:  Principal Problem:   Hematuria Active Problems:   Hypertension    Discharge Instructions  Discharge Instructions    Diet - low sodium heart healthy   Complete by: As directed    Discharge instructions   Complete by: As directed    1) Please follow up  with urology within a week. You are being discharged with a foley catheter.Foley will be removed at urology office. 2)Follow up with your PCP in a week. Do a CBC test during the follow up. 3)Take prescribed medications as instructed.   Increase activity slowly   Complete by: As directed      Allergies as of 10/28/2018      Reactions   Aspirin    Has history of ulcers; takes a low dose 81 mg daily but avoids 325 mg   Codeine Nausea And Vomiting      Medication List    TAKE these medications   ALPRAZolam 1 MG tablet Commonly known as: XANAX Take 1 mg by mouth every 8 (eight) hours.   atorvastatin 80 MG tablet Commonly known as: LIPITOR TAKE ONE TABLET BY MOUTH DAILY   benazepril 10 MG tablet Commonly known as: LOTENSIN TAKE 1 TABLET BY MOUTH DAILY   butalbital-acetaminophen-caffeine 50-325-40 MG tablet Commonly known as: FIORICET Take 1 tablet by mouth every 6 (six) hours as needed for headache.   docusate sodium 100 MG capsule Commonly known as: Colace Take 1 capsule (100 mg total) by mouth 2 (two) times daily.   ferrous sulfate 325 (65 FE) MG tablet Commonly known as: FerrouSul Take 1 tablet (325 mg total) by mouth 3 (three) times daily with meals. What changed: when to take this   FLUoxetine 10 MG capsule Commonly known as: PROZAC Take 10 mg by mouth daily.   hydrochlorothiazide 25 MG tablet Commonly known as: HYDRODIURIL Take 25 mg by mouth daily.   HYDROcodone-acetaminophen 7.5-325 MG tablet Commonly known as: Norco Take 1-2 tablets by mouth every 4 (four) hours as needed for  moderate pain.   HYDROcodone-acetaminophen 7.5-325 MG tablet Commonly known as: Norco Take 1-2 tablets by mouth every 4 (four) hours as needed for moderate pain.   methocarbamol 500 MG tablet Commonly known as: Robaxin Take 1 tablet (500 mg total) by mouth every 6 (six) hours as needed for muscle spasms.   metoprolol tartrate 50 MG tablet Commonly known as: LOPRESSOR Take 50 mg  by mouth 2 (two) times daily.   multivitamin with minerals Tabs tablet Take 1 tablet by mouth daily.   ondansetron 4 MG tablet Commonly known as: ZOFRAN Take 4 mg by mouth every 6 (six) hours as needed for nausea or vomiting.   pantoprazole 40 MG tablet Commonly known as: PROTONIX TAKE 1 TABLET BY MOUTH TWICE DAILY   polyethylene glycol 17 g packet Commonly known as: MIRALAX / GLYCOLAX Take 17 g by mouth 2 (two) times daily.   sulfamethoxazole-trimethoprim 400-80 MG tablet Commonly known as: Bactrim Take 1 tablet by mouth 2 (two) times daily for 5 days.   traMADol 50 MG tablet Commonly known as: ULTRAM Take 50 mg by mouth every 6 (six) hours as needed for moderate pain.      Follow-up Information    Call Festus Aloe, MD.   Specialty: Urology Contact information: [place] [address] [phone]        Neale Burly, MD. Schedule an appointment as soon as possible for a visit in 1 week(s).   Specialty: Internal Medicine Contact information: [place] [phone]          Allergies  Allergen Reactions  . Aspirin     Has history of ulcers; takes a low dose 81 mg daily but avoids 325 mg  . Codeine Nausea And Vomiting    Consultations:  Urology   Procedures/Studies: Ct Renal Stone Study  Result Date: 10/26/2018 CLINICAL DATA:  Hematuria with an unknown cause. EXAM: CT ABDOMEN AND PELVIS WITHOUT CONTRAST TECHNIQUE: Multidetector CT imaging of the abdomen and pelvis was performed following the standard protocol without IV contrast. COMPARISON:  October 11, 2018 FINDINGS: Lower chest: The lung bases are clear. The heart size is normal. Hepatobiliary: The liver is normal. Status post cholecystectomy.There is no biliary ductal dilation. Pancreas: Normal contours without ductal dilatation. No peripancreatic fluid collection. Spleen: No splenic laceration or hematoma. Adrenals/Urinary Tract: --Adrenal glands:  Again noted is a right-sided adrenal mass, similar to prior study. --Right kidney/ureter: There is a lobular appearance of the right kidney. There is no hydronephrosis. There is likely cortical thinning of the upper pole the right kidney. No radiopaque obstructing stones. --Left kidney/ureter: The left kidney is atrophic and somewhat lobular, similar to prior study. There is no left-sided hydronephrosis. --Urinary bladder: Again noted is some bladder wall thickening. There is a hyperdense filling defect within the dependent portion of the urinary bladder. This filling defect measures approximately 7.1 x 4.7 cm. Stomach/Bowel: --Stomach/Duodenum: There is a large hiatal hernia. --Small bowel: No dilatation or inflammation. --Colon: No focal abnormality. --Appendix: Surgically absent. Vascular/Lymphatic: Atherosclerotic calcification is present within the non-aneurysmal abdominal aorta, without hemodynamically significant stenosis. --No retroperitoneal lymphadenopathy. --No mesenteric lymphadenopathy. --No pelvic or inguinal lymphadenopathy. Reproductive: Unremarkable Other: No ascites or free air. The abdominal wall is normal. Musculoskeletal. There is a pars defect at L5 resulting in grade 2-3 anterolisthesis of L5 on S1. There is stable vertebral plana of the L2 vertebral body. IMPRESSION: 1. Large 7.1 x 4.7 cm filling defect within the dependent portion  of the urinary bladder. This is favored to represent a blood clot given its short interval development. Again noted is nonspecific bladder wall thickening. 2. Multiple additional chronic findings as detailed above, not significantly changed from CT dated 10/11/2018. Electronically Signed   By: Constance Holster M.D.   On: 10/26/2018 19:24       Subjective:  Patient seen and examined the bedside this morning.  Hemodynamically stable for discharge.  Discussed with the daughter and updated the  plan.  Discharge Exam: Vitals:   10/27/18 2004 10/28/18 0339   BP: 107/75 111/90  Pulse: 77 73  Resp: 16 16  Temp: 98.4 F (36.9 C) 98.7 F (37.1 C)  SpO2: 98% 96%   Vitals:   10/27/18 1325 10/27/18 1703 10/27/18 2004 10/28/18 0339  BP: 115/75 118/72 107/75 111/90  Pulse: 73 76 77 73  Resp: 16 18 16 16   Temp: 97.9 F (36.6 C) 98 F (36.7 C) 98.4 F (36.9 C) 98.7 F (37.1 C)  TempSrc: Oral Oral Oral Oral  SpO2: 94% 96% 98% 96%  Weight:      Height:        General: Pt is alert, awake, not in acute distress Cardiovascular: RRR, S1/S2 +, no rubs, no gallops Respiratory: CTA bilaterally, no wheezing, no rhonchi Abdominal: Soft, NT, ND, bowel sounds + Extremities: no edema, no cyanosis    The results of significant diagnostics from this hospitalization (including imaging, microbiology, ancillary and laboratory) are listed below for reference.     Microbiology: Recent Results (from the past 240 hour(s))  SARS CORONAVIRUS 2 (TAT 6-12 HRS) Nasal Swab Aptima Multi Swab     Status: None   Collection Time: 10/26/18 10:56 PM   Specimen: Aptima Multi Swab; Nasal Swab  Result Value Ref Range Status   SARS Coronavirus 2 NEGATIVE NEGATIVE Final    Comment: (NOTE) SARS-CoV-2 target nucleic acids are NOT DETECTED. The SARS-CoV-2 RNA is generally detectable in upper and lower respiratory specimens during the acute phase of infection. Negative results do not preclude SARS-CoV-2 infection, do not rule out co-infections with other pathogens, and should not be used as the sole basis for treatment or other patient management decisions. Negative results must be combined with clinical observations, patient history, and epidemiological information. The expected result is Negative. Fact Sheet for Patients: SugarRoll.be Fact Sheet for Healthcare Providers: https://www.woods-mathews.com/ This test is not yet approved or cleared by the [place] FDA and  has been authorized for detection and/or diagnosis of  SARS-CoV-2 by FDA under an Emergency Use Authorization (EUA). This EUA will remain  in effect (meaning this test can be used) for the duration of the COVID-19 declaration under Section 56 4(b)(1) of the Act, 21 U.S.C. section 360bbb-3(b)(1), unless the authorization is terminated or revoked sooner. Performed at [facility], [place] [address] [place], [place] [address]      Labs: BNP (last 3 results) No results for input(s): BNP in the last 8760 hours. Basic Metabolic Panel: Recent Labs  Lab 10/26/18 1948 10/27/18 0501  NA 142 142  K 5.1 4.1  CL 116* 116*  CO2 16* 16*  GLUCOSE 82 87  BUN 40* 33*  CREATININE 0.95 0.90  CALCIUM 8.5* 8.5*   Liver Function Tests: Recent Labs  Lab 10/26/18 1948 10/27/18 0501  AST 16 13*  ALT 18 17  ALKPHOS 97 93  BILITOT 0.5 0.4  PROT 6.0* 5.1*  ALBUMIN 3.2* 2.8*   No results for input(s): LIPASE, AMYLASE in the last  168 hours. No results for input(s): AMMONIA in the last 168 hours. CBC: Recent Labs  Lab 10/26/18 1948 10/27/18 0501 10/28/18 0430  WBC 9.3 7.5 7.5  NEUTROABS 5.9  --  5.1  HGB 10.3* 9.5* 8.3*  HCT 35.5* 31.4* 27.1*  MCV 107.6* 104.0* 102.7*  PLT 299 280 256   Cardiac Enzymes: No results for input(s): CKTOTAL, CKMB, CKMBINDEX, TROPONINI in the last 168 hours. BNP: Invalid input(s): POCBNP CBG: No results for input(s): GLUCAP in the last 168 hours. D-Dimer No results for input(s): DDIMER in the last 72 hours. Hgb A1c No results for input(s): HGBA1C in the last 72 hours. Lipid Profile No results for input(s): CHOL, HDL, LDLCALC, TRIG, CHOLHDL, LDLDIRECT in the last 72 hours. Thyroid function studies No results for input(s): TSH, T4TOTAL, T3FREE, THYROIDAB in the last 72 hours.  Invalid input(s): FREET3 Anemia work up No results for input(s): VITAMINB12, FOLATE, FERRITIN, TIBC, IRON, RETICCTPCT in the last 72 hours. Urinalysis    Component Value Date/Time   COLORURINE RED (A) 10/26/2018 2045    APPEARANCEUR TURBID (A) 10/26/2018 2045   LABSPEC  10/26/2018 2045    TEST NOT REPORTED DUE TO COLOR INTERFERENCE OF URINE PIGMENT   PHURINE  10/26/2018 2045    TEST NOT REPORTED DUE TO COLOR INTERFERENCE OF URINE PIGMENT   GLUCOSEU NEGATIVE 10/26/2018 2045   HGBUR NEGATIVE 10/26/2018 2045   BILIRUBINUR NEGATIVE 10/26/2018 2045   KETONESUR NEGATIVE 10/26/2018 2045   PROTEINUR NEGATIVE 10/26/2018 2045   UROBILINOGEN 0.2 03/20/2013 1515   NITRITE NEGATIVE 10/26/2018 2045   LEUKOCYTESUR NEGATIVE 10/26/2018 2045   Sepsis Labs Invalid input(s): PROCALCITONIN,  WBC,  LACTICIDVEN Microbiology Recent Results (from the past 240 hour(s))  SARS CORONAVIRUS 2 (TAT 6-12 HRS) Nasal Swab Aptima Multi Swab     Status: None   Collection Time: 10/26/18 10:56 PM   Specimen: Aptima Multi Swab; Nasal Swab  Result Value Ref Range Status   SARS Coronavirus 2 NEGATIVE NEGATIVE Final    Comment: (NOTE) SARS-CoV-2 target nucleic acids are NOT DETECTED. The SARS-CoV-2 RNA is generally detectable in upper and lower respiratory specimens during the acute phase of infection. Negative results do not preclude SARS-CoV-2 infection, do not rule out co-infections with other pathogens, and should not be used as the sole basis for treatment or other patient management decisions. Negative results must be combined with clinical observations, patient history, and epidemiological information. The expected result is Negative. Fact Sheet for Patients: SugarRoll.be Fact Sheet for Healthcare Providers: https://www.woods-mathews.com/ This test is not yet approved or cleared by the [place] FDA and  has been authorized for detection and/or diagnosis of SARS-CoV-2 by FDA under an Emergency Use Authorization (EUA). This EUA will remain  in effect (meaning this test can be used) for the duration of the COVID-19 declaration under Section 56 4(b)(1) of the Act, 21 U.S.C. section  360bbb-3(b)(1), unless the authorization is terminated or revoked sooner. Performed at [facility], [place] [address], [place], [place] [address]     Please note: You were cared for by a hospitalist during your hospital stay. Once you are discharged, your primary care physician will handle any further medical issues. Please note that NO REFILLS for any discharge medications will be authorized once you are discharged, as it is imperative that you return to your primary care physician (or establish a relationship with a primary care physician if you do not have one) for your post hospital discharge needs so that they can reassess your  need for medications and monitor your lab values.    Time coordinating discharge: 40 minutes  SIGNED:   Shelly Coss, MD  [facility] 10/28/2018, 8:55 AM Pager [phone]  If 7PM-7AM, please contact night-coverage [email or web address] Password [email or web address]

## 2018-10-30 ENCOUNTER — Other Ambulatory Visit: Payer: Self-pay | Admitting: Cardiology

## 2018-10-30 DIAGNOSIS — N3289 Other specified disorders of bladder: Secondary | ICD-10-CM | POA: Diagnosis not present

## 2018-10-30 DIAGNOSIS — N2889 Other specified disorders of kidney and ureter: Secondary | ICD-10-CM | POA: Diagnosis not present

## 2018-10-30 DIAGNOSIS — R310 Gross hematuria: Secondary | ICD-10-CM | POA: Diagnosis not present

## 2018-11-02 DIAGNOSIS — L89893 Pressure ulcer of other site, stage 3: Secondary | ICD-10-CM | POA: Diagnosis not present

## 2018-11-02 DIAGNOSIS — I10 Essential (primary) hypertension: Secondary | ICD-10-CM | POA: Diagnosis not present

## 2018-11-02 DIAGNOSIS — K219 Gastro-esophageal reflux disease without esophagitis: Secondary | ICD-10-CM | POA: Diagnosis not present

## 2018-11-02 DIAGNOSIS — Z96641 Presence of right artificial hip joint: Secondary | ICD-10-CM | POA: Diagnosis not present

## 2018-11-02 DIAGNOSIS — Z9181 History of falling: Secondary | ICD-10-CM | POA: Diagnosis not present

## 2018-11-02 DIAGNOSIS — E872 Acidosis: Secondary | ICD-10-CM | POA: Diagnosis not present

## 2018-11-02 DIAGNOSIS — I700 Atherosclerosis of aorta: Secondary | ICD-10-CM | POA: Diagnosis not present

## 2018-11-02 DIAGNOSIS — E440 Moderate protein-calorie malnutrition: Secondary | ICD-10-CM | POA: Diagnosis not present

## 2018-11-02 DIAGNOSIS — Z8781 Personal history of (healed) traumatic fracture: Secondary | ICD-10-CM | POA: Diagnosis not present

## 2018-11-02 DIAGNOSIS — K648 Other hemorrhoids: Secondary | ICD-10-CM | POA: Diagnosis not present

## 2018-11-05 DIAGNOSIS — K648 Other hemorrhoids: Secondary | ICD-10-CM | POA: Insufficient documentation

## 2018-11-05 DIAGNOSIS — K625 Hemorrhage of anus and rectum: Secondary | ICD-10-CM | POA: Insufficient documentation

## 2018-11-08 DIAGNOSIS — Z01812 Encounter for preprocedural laboratory examination: Secondary | ICD-10-CM | POA: Diagnosis not present

## 2018-11-09 ENCOUNTER — Other Ambulatory Visit: Payer: Self-pay

## 2018-11-09 ENCOUNTER — Encounter (HOSPITAL_COMMUNITY): Payer: Self-pay

## 2018-11-09 DIAGNOSIS — Z96652 Presence of left artificial knee joint: Secondary | ICD-10-CM | POA: Diagnosis present

## 2018-11-09 DIAGNOSIS — N179 Acute kidney failure, unspecified: Secondary | ICD-10-CM | POA: Diagnosis present

## 2018-11-09 DIAGNOSIS — D539 Nutritional anemia, unspecified: Secondary | ICD-10-CM | POA: Diagnosis present

## 2018-11-09 DIAGNOSIS — K219 Gastro-esophageal reflux disease without esophagitis: Secondary | ICD-10-CM | POA: Diagnosis present

## 2018-11-09 DIAGNOSIS — N281 Cyst of kidney, acquired: Secondary | ICD-10-CM | POA: Diagnosis present

## 2018-11-09 DIAGNOSIS — B952 Enterococcus as the cause of diseases classified elsewhere: Secondary | ICD-10-CM | POA: Diagnosis present

## 2018-11-09 DIAGNOSIS — R310 Gross hematuria: Secondary | ICD-10-CM | POA: Diagnosis not present

## 2018-11-09 DIAGNOSIS — G4733 Obstructive sleep apnea (adult) (pediatric): Secondary | ICD-10-CM | POA: Diagnosis present

## 2018-11-09 DIAGNOSIS — N390 Urinary tract infection, site not specified: Principal | ICD-10-CM | POA: Diagnosis present

## 2018-11-09 DIAGNOSIS — Z8673 Personal history of transient ischemic attack (TIA), and cerebral infarction without residual deficits: Secondary | ICD-10-CM

## 2018-11-09 DIAGNOSIS — Z885 Allergy status to narcotic agent status: Secondary | ICD-10-CM

## 2018-11-09 DIAGNOSIS — E878 Other disorders of electrolyte and fluid balance, not elsewhere classified: Secondary | ICD-10-CM | POA: Diagnosis present

## 2018-11-09 DIAGNOSIS — Z03818 Encounter for observation for suspected exposure to other biological agents ruled out: Secondary | ICD-10-CM | POA: Diagnosis not present

## 2018-11-09 DIAGNOSIS — Z96641 Presence of right artificial hip joint: Secondary | ICD-10-CM | POA: Diagnosis present

## 2018-11-09 DIAGNOSIS — Z79891 Long term (current) use of opiate analgesic: Secondary | ICD-10-CM

## 2018-11-09 DIAGNOSIS — Z955 Presence of coronary angioplasty implant and graft: Secondary | ICD-10-CM

## 2018-11-09 DIAGNOSIS — Z1621 Resistance to vancomycin: Secondary | ICD-10-CM | POA: Diagnosis present

## 2018-11-09 DIAGNOSIS — Z20828 Contact with and (suspected) exposure to other viral communicable diseases: Secondary | ICD-10-CM | POA: Diagnosis present

## 2018-11-09 DIAGNOSIS — F329 Major depressive disorder, single episode, unspecified: Secondary | ICD-10-CM | POA: Diagnosis present

## 2018-11-09 DIAGNOSIS — I2510 Atherosclerotic heart disease of native coronary artery without angina pectoris: Secondary | ICD-10-CM | POA: Diagnosis present

## 2018-11-09 DIAGNOSIS — E875 Hyperkalemia: Secondary | ICD-10-CM | POA: Diagnosis present

## 2018-11-09 DIAGNOSIS — K449 Diaphragmatic hernia without obstruction or gangrene: Secondary | ICD-10-CM | POA: Diagnosis present

## 2018-11-09 DIAGNOSIS — Z7982 Long term (current) use of aspirin: Secondary | ICD-10-CM

## 2018-11-09 DIAGNOSIS — Z79899 Other long term (current) drug therapy: Secondary | ICD-10-CM

## 2018-11-09 DIAGNOSIS — N261 Atrophy of kidney (terminal): Secondary | ICD-10-CM | POA: Diagnosis present

## 2018-11-09 DIAGNOSIS — Z9049 Acquired absence of other specified parts of digestive tract: Secondary | ICD-10-CM

## 2018-11-09 DIAGNOSIS — I10 Essential (primary) hypertension: Secondary | ICD-10-CM | POA: Diagnosis present

## 2018-11-09 DIAGNOSIS — E872 Acidosis: Secondary | ICD-10-CM | POA: Diagnosis present

## 2018-11-09 DIAGNOSIS — F419 Anxiety disorder, unspecified: Secondary | ICD-10-CM | POA: Diagnosis present

## 2018-11-09 DIAGNOSIS — F17210 Nicotine dependence, cigarettes, uncomplicated: Secondary | ICD-10-CM | POA: Diagnosis present

## 2018-11-09 DIAGNOSIS — Z886 Allergy status to analgesic agent status: Secondary | ICD-10-CM

## 2018-11-09 DIAGNOSIS — Z90710 Acquired absence of both cervix and uterus: Secondary | ICD-10-CM

## 2018-11-09 DIAGNOSIS — G43909 Migraine, unspecified, not intractable, without status migrainosus: Secondary | ICD-10-CM | POA: Diagnosis present

## 2018-11-09 DIAGNOSIS — Z716 Tobacco abuse counseling: Secondary | ICD-10-CM

## 2018-11-09 NOTE — ED Triage Notes (Addendum)
Pt reports bright red blood in her urine and bleeding from her urethra when not urinating. She was seen and admitted at [facility] a few weeks ago and seen at [facility] recently for same as well. Urology on call referred pt to this ED. She also endorses weakness. Denies pain all the time, but reports intermittent cramps that she describes as "Like when I was on my monthly."

## 2018-11-10 ENCOUNTER — Encounter (HOSPITAL_COMMUNITY): Payer: Self-pay | Admitting: *Deleted

## 2018-11-10 ENCOUNTER — Inpatient Hospital Stay (HOSPITAL_COMMUNITY): Payer: Medicare Other

## 2018-11-10 ENCOUNTER — Other Ambulatory Visit: Payer: Self-pay

## 2018-11-10 ENCOUNTER — Inpatient Hospital Stay (HOSPITAL_COMMUNITY)
Admission: EM | Admit: 2018-11-10 | Discharge: 2018-11-13 | DRG: 690 | Disposition: A | Discharge status: Home Health | Payer: Medicare Other | Attending: Internal Medicine | Admitting: Internal Medicine

## 2018-11-10 DIAGNOSIS — Z20828 Contact with and (suspected) exposure to other viral communicable diseases: Secondary | ICD-10-CM | POA: Diagnosis present

## 2018-11-10 DIAGNOSIS — Z955 Presence of coronary angioplasty implant and graft: Secondary | ICD-10-CM

## 2018-11-10 DIAGNOSIS — N281 Cyst of kidney, acquired: Secondary | ICD-10-CM | POA: Diagnosis not present

## 2018-11-10 DIAGNOSIS — B952 Enterococcus as the cause of diseases classified elsewhere: Secondary | ICD-10-CM | POA: Diagnosis not present

## 2018-11-10 DIAGNOSIS — R319 Hematuria, unspecified: Secondary | ICD-10-CM | POA: Diagnosis not present

## 2018-11-10 DIAGNOSIS — N390 Urinary tract infection, site not specified: Secondary | ICD-10-CM | POA: Diagnosis present

## 2018-11-10 DIAGNOSIS — E872 Acidosis: Secondary | ICD-10-CM | POA: Diagnosis not present

## 2018-11-10 DIAGNOSIS — Z8673 Personal history of transient ischemic attack (TIA), and cerebral infarction without residual deficits: Secondary | ICD-10-CM | POA: Diagnosis not present

## 2018-11-10 DIAGNOSIS — G4733 Obstructive sleep apnea (adult) (pediatric): Secondary | ICD-10-CM | POA: Diagnosis not present

## 2018-11-10 DIAGNOSIS — D539 Nutritional anemia, unspecified: Secondary | ICD-10-CM | POA: Diagnosis not present

## 2018-11-10 DIAGNOSIS — K219 Gastro-esophageal reflux disease without esophagitis: Secondary | ICD-10-CM | POA: Diagnosis not present

## 2018-11-10 DIAGNOSIS — Z716 Tobacco abuse counseling: Secondary | ICD-10-CM | POA: Diagnosis not present

## 2018-11-10 DIAGNOSIS — N3001 Acute cystitis with hematuria: Secondary | ICD-10-CM | POA: Diagnosis not present

## 2018-11-10 DIAGNOSIS — N261 Atrophy of kidney (terminal): Secondary | ICD-10-CM | POA: Diagnosis not present

## 2018-11-10 DIAGNOSIS — I10 Essential (primary) hypertension: Secondary | ICD-10-CM | POA: Diagnosis present

## 2018-11-10 DIAGNOSIS — I2510 Atherosclerotic heart disease of native coronary artery without angina pectoris: Secondary | ICD-10-CM | POA: Diagnosis not present

## 2018-11-10 DIAGNOSIS — N179 Acute kidney failure, unspecified: Secondary | ICD-10-CM

## 2018-11-10 DIAGNOSIS — G43909 Migraine, unspecified, not intractable, without status migrainosus: Secondary | ICD-10-CM | POA: Diagnosis not present

## 2018-11-10 DIAGNOSIS — E875 Hyperkalemia: Secondary | ICD-10-CM | POA: Diagnosis not present

## 2018-11-10 DIAGNOSIS — Z96652 Presence of left artificial knee joint: Secondary | ICD-10-CM | POA: Diagnosis not present

## 2018-11-10 DIAGNOSIS — E878 Other disorders of electrolyte and fluid balance, not elsewhere classified: Secondary | ICD-10-CM | POA: Diagnosis not present

## 2018-11-10 DIAGNOSIS — R310 Gross hematuria: Secondary | ICD-10-CM | POA: Diagnosis present

## 2018-11-10 DIAGNOSIS — F419 Anxiety disorder, unspecified: Secondary | ICD-10-CM | POA: Diagnosis present

## 2018-11-10 DIAGNOSIS — Z1621 Resistance to vancomycin: Secondary | ICD-10-CM | POA: Diagnosis not present

## 2018-11-10 DIAGNOSIS — K449 Diaphragmatic hernia without obstruction or gangrene: Secondary | ICD-10-CM | POA: Diagnosis not present

## 2018-11-10 DIAGNOSIS — F329 Major depressive disorder, single episode, unspecified: Secondary | ICD-10-CM | POA: Diagnosis present

## 2018-11-10 DIAGNOSIS — F17210 Nicotine dependence, cigarettes, uncomplicated: Secondary | ICD-10-CM | POA: Diagnosis present

## 2018-11-10 LAB — CBC
HCT: 37.9 % (ref 36.0–46.0)
Hemoglobin: 11.2 g/dL — ABNORMAL LOW (ref 12.0–15.0)
MCH: 31.5 pg (ref 26.0–34.0)
MCHC: 29.6 g/dL — ABNORMAL LOW (ref 30.0–36.0)
MCV: 106.5 fL — ABNORMAL HIGH (ref 80.0–100.0)
Platelets: 350 10*3/uL (ref 150–400)
RBC: 3.56 MIL/uL — ABNORMAL LOW (ref 3.87–5.11)
RDW: 14.9 % (ref 11.5–15.5)
WBC: 12.2 10*3/uL — ABNORMAL HIGH (ref 4.0–10.5)
nRBC: 0 % (ref 0.0–0.2)

## 2018-11-10 LAB — BASIC METABOLIC PANEL
Anion gap: 14 (ref 5–15)
BUN: 68 mg/dL — ABNORMAL HIGH (ref 8–23)
CO2: 13 mmol/L — ABNORMAL LOW (ref 22–32)
Calcium: 9.2 mg/dL (ref 8.9–10.3)
Chloride: 110 mmol/L (ref 98–111)
Creatinine, Ser: 2.07 mg/dL — ABNORMAL HIGH (ref 0.44–1.00)
GFR calc Af Amer: 28 mL/min — ABNORMAL LOW (ref >60)
GFR calc non Af Amer: 24 mL/min — ABNORMAL LOW (ref >60)
Glucose, Bld: 97 mg/dL (ref 70–99)
Potassium: 5.2 mmol/L — ABNORMAL HIGH (ref 3.5–5.1)
Sodium: 137 mmol/L (ref 135–145)

## 2018-11-10 LAB — IRON AND TIBC
Iron: 22 ug/dL — ABNORMAL LOW (ref 28–170)
Saturation Ratios: 6 % — ABNORMAL LOW (ref 10.4–31.8)
TIBC: 383 ug/dL (ref 250–450)
UIBC: 361 ug/dL

## 2018-11-10 LAB — URINALYSIS, ROUTINE W REFLEX MICROSCOPIC
Bilirubin Urine: NEGATIVE
Glucose, UA: NEGATIVE mg/dL
Ketones, ur: NEGATIVE mg/dL
Nitrite: NEGATIVE
Protein, ur: 100 mg/dL — AB
RBC / HPF: >50 RBC/hpf — ABNORMAL HIGH (ref 0–5)
Specific Gravity, Urine: 1.015 (ref 1.005–1.030)
WBC, UA: >50 WBC/hpf — ABNORMAL HIGH (ref 0–5)
pH: 6 (ref 5.0–8.0)

## 2018-11-10 LAB — HEMOGLOBIN AND HEMATOCRIT, BLOOD
HCT: 36.2 % (ref 36.0–46.0)
HCT: 35.3 % — ABNORMAL LOW (ref 36.0–46.0)
Hemoglobin: 10.7 g/dL — ABNORMAL LOW (ref 12.0–15.0)
Hemoglobin: 10.7 g/dL — ABNORMAL LOW (ref 12.0–15.0)

## 2018-11-10 LAB — SARS CORONAVIRUS 2 BY RT PCR (HOSPITAL ORDER, PERFORMED IN ~~LOC~~ HOSPITAL LAB): SARS Coronavirus 2: NEGATIVE

## 2018-11-10 LAB — RETICULOCYTES
Immature Retic Fract: 22.4 % — ABNORMAL HIGH (ref 2.3–15.9)
RBC.: 3.46 MIL/uL — ABNORMAL LOW (ref 3.87–5.11)
Retic Count, Absolute: 96.9 10*3/uL (ref 19.0–186.0)
Retic Ct Pct: 2.8 % (ref 0.4–3.1)

## 2018-11-10 LAB — TYPE AND SCREEN
ABO/RH(D): O POS
Antibody Screen: NEGATIVE

## 2018-11-10 LAB — FERRITIN: Ferritin: 19 ng/mL (ref 11–307)

## 2018-11-10 LAB — VITAMIN B12: Vitamin B-12: 551 pg/mL (ref 180–914)

## 2018-11-10 LAB — FOLATE: Folate: 13 ng/mL (ref >5.9)

## 2018-11-10 MED ORDER — ALPRAZOLAM 0.5 MG PO TABS
1.0000 mg | ORAL_TABLET | Freq: Three times a day (TID) | ORAL | Status: DC
  Administered 2018-11-10 – 2018-11-13 (×10): 1 mg via ORAL
  Filled 2018-11-10 (×10): qty 2

## 2018-11-10 MED ORDER — SODIUM CHLORIDE 0.9% IV SOLUTION: Freq: Once | INTRAVENOUS | Status: DC

## 2018-11-10 MED ORDER — PANTOPRAZOLE SODIUM 40 MG PO TBEC
40.0000 mg | DELAYED_RELEASE_TABLET | Freq: Two times a day (BID) | ORAL | Status: DC
  Administered 2018-11-10 – 2018-11-13 (×7): 40 mg via ORAL
  Filled 2018-11-10 (×7): qty 1

## 2018-11-10 MED ORDER — SODIUM CHLORIDE 0.9 % IV SOLN
INTRAVENOUS | Status: DC
  Administered 2018-11-10: 13:00:00 via INTRAVENOUS

## 2018-11-10 MED ORDER — AMOXICILLIN-POT CLAVULANATE 875-125 MG PO TABS: 1.0000 | ORAL_TABLET | Freq: Two times a day (BID) | ORAL | Status: DC

## 2018-11-10 MED ORDER — BUTALBITAL-APAP-CAFFEINE 50-325-40 MG PO TABS
1.0000 | ORAL_TABLET | Freq: Four times a day (QID) | ORAL | Status: DC | PRN
  Administered 2018-11-10 – 2018-11-13 (×7): 1 via ORAL
  Filled 2018-11-10 (×7): qty 1

## 2018-11-10 MED ORDER — SODIUM CHLORIDE 0.9 % IR SOLN: 3000.0000 mL | Status: DC

## 2018-11-10 MED ORDER — SODIUM CHLORIDE 0.9 % IV SOLN
1.0000 g | INTRAVENOUS | Status: DC
  Administered 2018-11-10: 1 g via INTRAVENOUS
  Filled 2018-11-10: qty 10

## 2018-11-10 MED ORDER — AMOXICILLIN-POT CLAVULANATE 500-125 MG PO TABS
1.0000 | ORAL_TABLET | Freq: Two times a day (BID) | ORAL | Status: DC
  Administered 2018-11-10 – 2018-11-11 (×2): 500 mg via ORAL
  Filled 2018-11-10 (×3): qty 1

## 2018-11-10 MED ORDER — ATORVASTATIN CALCIUM 80 MG PO TABS
80.0000 mg | ORAL_TABLET | Freq: Every day | ORAL | Status: DC
  Administered 2018-11-10 – 2018-11-12 (×3): 80 mg via ORAL
  Filled 2018-11-10 (×3): qty 1
  Filled 2018-11-10 (×3): qty 2
  Filled 2018-11-10: qty 1

## 2018-11-10 MED ORDER — METOPROLOL TARTRATE 25 MG PO TABS
50.0000 mg | ORAL_TABLET | Freq: Two times a day (BID) | ORAL | Status: DC
  Administered 2018-11-10 – 2018-11-12 (×4): 50 mg via ORAL
  Filled 2018-11-10 (×5): qty 2

## 2018-11-10 MED ORDER — TRAMADOL HCL 50 MG PO TABS
50.0000 mg | ORAL_TABLET | Freq: Two times a day (BID) | ORAL | Status: DC | PRN
  Administered 2018-11-10 – 2018-11-12 (×4): 50 mg via ORAL
  Filled 2018-11-10 (×4): qty 1

## 2018-11-10 MED ORDER — SODIUM CHLORIDE 0.9 % IV SOLN
125.0000 mg | Freq: Once | INTRAVENOUS | Status: AC
  Administered 2018-11-10: 18:00:00 125 mg via INTRAVENOUS
  Filled 2018-11-10: qty 10
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
## 2018-11-10 NOTE — Progress Notes (Signed)
Verbal discussion with Dr. Esther Hardy can eat-He thinks she may be able to even go home in nxt 24 hours if no further hematuria--f/u per Dr. Junious Silk as OP, Dr. Louis Meckel will arrange Change to augmentin--home am?  Jaclyn Griffes, MD [facility] 1:27 PM

## 2018-11-10 NOTE — ED Notes (Signed)
Contact is daughter: Ivy Lynn [phone]

## 2018-11-10 NOTE — Plan of Care (Signed)
Plan of care 
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
## 2018-11-10 NOTE — ED Notes (Signed)
Order to replace foley with 7f 3 way and start bladder irrigation

## 2018-11-10 NOTE — ED Notes (Signed)
ED TO INPATIENT HANDOFF REPORT  Name/Age/Gender Jaclyn Hart 68 y.o. female  Code Status Code Status History    Date Active Date Inactive Code Status Order ID Comments User Context   10/26/2018 2307 10/28/2018 1656 Full Code [id number]  Jani Gravel, MD ED   03/12/2018 1745 03/14/2018 1836 Full Code [id number]  Norman Herrlich Inpatient   03/19/2013 2349 03/21/2013 1734 Full Code [id number]  Theressa Stamps, MD Inpatient   Advance Care Planning Activity      Home/SNF/Other Home  Chief Complaint Blood in Urine Excessive Blood Weakness Recent Surgery  Level of Care/Admitting Diagnosis ED Disposition    ED Disposition Condition [place]: [facility] [id number]  Level of Care: Med-Surg [16]  Covid Evaluation: Asymptomatic Screening Protocol (No Symptoms)  Diagnosis: Gross hematuria [599.71.ICD-9-CM]  Admitting Physician: Rosemarie Beath [facility] [id number]  Attending Physician: Hollice Gong, MIR [facility] [id number]  Estimated length of stay: past midnight tomorrow  Certification:: I certify this patient will need inpatient services for at least 2 midnights  PT Class (Do Not Modify): Inpatient [101]  PT Acc Code (Do Not Modify): Private [1]       Medical History Past Medical History:  Diagnosis Date  . Anxiety   . Arthritis    osteoarthritis; knees & shoulders  . Bladder wall thickening   . Blood clot in bladder   . Depression   . GERD (gastroesophageal reflux disease)   . Gross hematuria   . H/O hiatal hernia   . Headache(784.0)   . Hypertension   . PONV (postoperative nausea and vomiting)   . Sleep apnea   . Stroke [facility])     Allergies Allergies  Allergen Reactions  . Aspirin     Has history of ulcers; takes a low dose 81 mg daily but avoids 325 mg  . Codeine Nausea And Vomiting    IV Location/Drains/Wounds Patient Lines/Drains/Airways Status   Active Line/Drains/Airways    Name:   Placement date:   Placement time:    Site:   Days:   Peripheral IV 11/10/18 Left Forearm   11/10/18    0405    Forearm   less than 1   Urethral Catheter kelly gibson Triple-lumen 22 Fr.   11/10/18    0606    Triple-lumen   less than 1   Incision (Closed) 03/12/18 Knee Left   03/12/18    1526     243          Labs/Imaging Results for orders placed or performed during the hospital encounter of 11/10/18 (from the past 48 hour(s))  Urinalysis, Routine w reflex microscopic- may I&O cath if menses     Status: Abnormal   Collection Time: 11/10/18  1:50 AM  Result Value Ref Range   Color, Urine RED (A) YELLOW   APPearance TURBID (A) CLEAR   Specific Gravity, Urine 1.015 1.005 - 1.030   pH 6.0 5.0 - 8.0   Glucose, UA NEGATIVE NEGATIVE mg/dL   Hgb urine dipstick LARGE (A) NEGATIVE   Bilirubin Urine NEGATIVE NEGATIVE   Ketones, ur NEGATIVE NEGATIVE mg/dL   Protein, ur 100 (A) NEGATIVE mg/dL   Nitrite NEGATIVE NEGATIVE   Leukocytes,Ua LARGE (A) NEGATIVE   RBC / HPF >50 (H) 0 - 5 RBC/hpf   WBC, UA >50 (H) 0 - 5 WBC/hpf   Bacteria, UA MANY (A) NONE SEEN    Comment: Performed at [facility], [place] [address] [place], [place] [address]  Basic metabolic panel     Status: Abnormal   Collection Time: 11/10/18  3:15 AM  Result Value Ref Range   Sodium 137 135 - 145 mmol/L   Potassium 5.2 (H) 3.5 - 5.1 mmol/L   Chloride 110 98 - 111 mmol/L   CO2 13 (L) 22 - 32 mmol/L   Glucose, Bld 97 70 - 99 mg/dL   BUN 68 (H) 8 - 23 mg/dL   Creatinine, Ser 2.07 (H) 0.44 - 1.00 mg/dL   Calcium 9.2 8.9 - 10.3 mg/dL   GFR calc non Af Amer 24 (L) >60 mL/min   GFR calc Af Amer 28 (L) >60 mL/min   Anion gap 14 5 - 15    Comment: Performed at [facility], [place] [address] [place], [place] [address]  CBC     Status: Abnormal   Collection Time: 11/10/18  3:15 AM  Result Value Ref Range   WBC 12.2 (H) 4.0 - 10.5 K/uL   RBC 3.56 (L) 3.87 - 5.11 MIL/uL   Hemoglobin 11.2 (L) 12.0 - 15.0 g/dL   HCT 37.9 36.0 - 46.0  %   MCV 106.5 (H) 80.0 - 100.0 fL   MCH 31.5 26.0 - 34.0 pg   MCHC 29.6 (L) 30.0 - 36.0 g/dL   RDW 14.9 11.5 - 15.5 %   Platelets 350 150 - 400 K/uL   nRBC 0.0 0.0 - 0.2 %    Comment: Performed at [facility], [place] [address] [place], [place] [address]  SARS Coronavirus 2 [facility] order, Performed in [facility] hospital lab) Nasopharyngeal Nasopharyngeal Swab     Status: None   Collection Time: 11/10/18  6:10 AM   Specimen: Nasopharyngeal Swab  Result Value Ref Range   SARS Coronavirus 2 NEGATIVE NEGATIVE    Comment: (NOTE) If result is NEGATIVE SARS-CoV-2 target nucleic acids are NOT DETECTED. The SARS-CoV-2 RNA is generally detectable in upper and lower  respiratory specimens during the acute phase of infection. The lowest  concentration of SARS-CoV-2 viral copies this assay can detect is 250  copies / mL. A negative result does not preclude SARS-CoV-2 infection  and should not be used as the sole basis for treatment or other  patient management decisions.  A negative result may occur with  improper specimen collection / handling, submission of specimen other  than nasopharyngeal swab, presence of viral mutation(s) within the  areas targeted by this assay, and inadequate number of viral copies  (<250 copies / mL). A negative result must be combined with clinical  observations, patient history, and epidemiological information. If result is POSITIVE SARS-CoV-2 target nucleic acids are DETECTED. The SARS-CoV-2 RNA is generally detectable in upper and lower  respiratory specimens dur ing the acute phase of infection.  Positive  results are indicative of active infection with SARS-CoV-2.  Clinical  correlation with patient history and other diagnostic information is  necessary to determine patient infection status.  Positive results do  not rule out bacterial infection or co-infection with other viruses. If result is PRESUMPTIVE POSTIVE SARS-CoV-2 nucleic acids  MAY BE PRESENT.   A presumptive positive result was obtained on the submitted specimen  and confirmed on repeat testing.  While 2019 novel coronavirus  (SARS-CoV-2) nucleic acids may be present in the submitted sample  additional confirmatory testing may be necessary for epidemiological  and / or clinical management purposes  to differentiate between  SARS-CoV-2 and other Sarbecovirus currently known to infect humans.  If clinically  indicated additional testing with an alternate test  methodology [phone]) is advised. The SARS-CoV-2 RNA is generally  detectable in upper and lower respiratory sp ecimens during the acute  phase of infection. The expected result is Negative. Fact Sheet for Patients:  StrictlyIdeas.no Fact Sheet for Healthcare Providers: BankingDealers.co.za This test is not yet approved or cleared by the [place] FDA and has been authorized for detection and/or diagnosis of SARS-CoV-2 by FDA under an Emergency Use Authorization (EUA).  This EUA will remain in effect (meaning this test can be used) for the duration of the COVID-19 declaration under Section 564(b)(1) of the Act, 21 U.S.C. section 360bbb-3(b)(1), unless the authorization is terminated or revoked sooner. Performed at [facility], [place] [address] [place], [place] [address]    US Renal  Result Date: 11/10/2018 CLINICAL DATA:  68 year old female with history of acute renal failure. Hematuria. EXAM: RENAL / URINARY TRACT ULTRASOUND COMPLETE COMPARISON:  No prior. FINDINGS: Right Kidney: Renal measurements: 11.2 x 5.0 x 5.0 = volume: 147 mL . Echogenicity within normal limits. 1.1 x 1.6 x 1.5 cm anechoic lesion with increased through transmission at the junction of upper and interpolar region of the right kidney, compatible with a simple cyst. No hydronephrosis visualized. Left Kidney: Renal measurements: 7.2 x 3.3 x 2.6 = volume: 32 mL. Atrophy  with diffuse cortical thinning and increased echogenicity in the renal parenchyma, indicative of medical renal disease. No hydronephrosis. Bladder: Foley balloon catheter inside a decompressed urinary bladder. IMPRESSION: 1. Left renal atrophy with increased cortical echogenicity indicative of medical renal disease. 2. No hydronephrosis to suggest urinary tract obstruction. 3. Simple cyst in the right kidney, as above. Electronically Signed   By: Vinnie Langton M.D.   On: 11/10/2018 08:51    Pending Labs Unresulted Labs (From admission, onward)    Start     Ordered   11/10/18 1600  Hemoglobin and hematocrit, blood  Once,   STAT     11/10/18 0822   11/10/18 0821  Culture, Urine  ONCE - STAT,   STAT     11/10/18 0820   Signed and Held  Vitamin B12  (Anemia Panel (PNL))  Add-on,   R     Signed and Held   Signed and Held  Folate  (Anemia Panel (PNL))  Add-on,   R     Signed and Held   Signed and Held  Iron and TIBC  (Anemia Panel (PNL))  Add-on,   R     Signed and Held   Signed and Held  Ferritin  (Anemia Panel (PNL))  Add-on,   R     Signed and Held   Signed and Held  Reticulocytes  (Anemia Panel (PNL))  Add-on,   R     Signed and Held   Signed and Held  Type and screen [place] - STAT,   R    Comments: [place]    Signed and Held          Vitals/Pain Today's Vitals   11/10/18 0820 11/10/18 0830 11/10/18 1000 11/10/18 1030  BP: (!) 89/69 102/67 94/61 103/64  Pulse: 73 67 74 76  Resp: 16 16 16    Temp:      TempSrc:      SpO2: 98% 96% 94% 99%  Weight:      Height:      PainSc:        Isolation Precautions Airborne and Contact precautions  Medications Medications  sodium chloride irrigation 0.9 % 3,000 mL (has no administration in time range)  cefTRIAXone (ROCEPHIN) 1 g in sodium chloride 0.9 % 100 mL IVPB (0 g Intravenous Stopped 11/10/18 0925)

## 2018-11-11 DIAGNOSIS — I10 Essential (primary) hypertension: Secondary | ICD-10-CM

## 2018-11-11 DIAGNOSIS — K219 Gastro-esophageal reflux disease without esophagitis: Secondary | ICD-10-CM

## 2018-11-11 DIAGNOSIS — N3001 Acute cystitis with hematuria: Secondary | ICD-10-CM

## 2018-11-11 DIAGNOSIS — Z955 Presence of coronary angioplasty implant and graft: Secondary | ICD-10-CM

## 2018-11-11 LAB — MAGNESIUM: Magnesium: 2.4 mg/dL (ref 1.7–2.4)

## 2018-11-11 LAB — CBC WITH DIFFERENTIAL/PLATELET
Abs Immature Granulocytes: 0.04 10*3/uL (ref 0.00–0.07)
Basophils Absolute: 0 10*3/uL (ref 0.0–0.1)
Basophils Relative: 1 %
Eosinophils Absolute: 0.3 10*3/uL (ref 0.0–0.5)
Eosinophils Relative: 5 %
HCT: 35.8 % — ABNORMAL LOW (ref 36.0–46.0)
Hemoglobin: 10.5 g/dL — ABNORMAL LOW (ref 12.0–15.0)
Immature Granulocytes: 1 %
Lymphocytes Relative: 20 %
Lymphs Abs: 1.5 10*3/uL (ref 0.7–4.0)
MCH: 31 pg (ref 26.0–34.0)
MCHC: 29.3 g/dL — ABNORMAL LOW (ref 30.0–36.0)
MCV: 105.6 fL — ABNORMAL HIGH (ref 80.0–100.0)
Monocytes Absolute: 0.4 10*3/uL (ref 0.1–1.0)
Monocytes Relative: 6 %
Neutro Abs: 5 10*3/uL (ref 1.7–7.7)
Neutrophils Relative %: 67 %
Platelets: 298 10*3/uL (ref 150–400)
RBC: 3.39 MIL/uL — ABNORMAL LOW (ref 3.87–5.11)
RDW: 14.9 % (ref 11.5–15.5)
WBC: 7.3 10*3/uL (ref 4.0–10.5)
nRBC: 0 % (ref 0.0–0.2)

## 2018-11-11 LAB — COMPREHENSIVE METABOLIC PANEL
ALT: 11 U/L (ref 0–44)
AST: 13 U/L — ABNORMAL LOW (ref 15–41)
Albumin: 3.5 g/dL (ref 3.5–5.0)
Alkaline Phosphatase: 131 U/L — ABNORMAL HIGH (ref 38–126)
Anion gap: 9 (ref 5–15)
BUN: 62 mg/dL — ABNORMAL HIGH (ref 8–23)
CO2: 15 mmol/L — ABNORMAL LOW (ref 22–32)
Calcium: 8.9 mg/dL (ref 8.9–10.3)
Chloride: 113 mmol/L — ABNORMAL HIGH (ref 98–111)
Creatinine, Ser: 1.46 mg/dL — ABNORMAL HIGH (ref 0.44–1.00)
GFR calc Af Amer: 42 mL/min — ABNORMAL LOW (ref >60)
GFR calc non Af Amer: 37 mL/min — ABNORMAL LOW (ref >60)
Glucose, Bld: 116 mg/dL — ABNORMAL HIGH (ref 70–99)
Potassium: 4.4 mmol/L (ref 3.5–5.1)
Sodium: 137 mmol/L (ref 135–145)
Total Bilirubin: 0.3 mg/dL (ref 0.3–1.2)
Total Protein: 6.7 g/dL (ref 6.5–8.1)

## 2018-11-11 LAB — PHOSPHORUS: Phosphorus: 3.7 mg/dL (ref 2.5–4.6)

## 2018-11-11 MED ORDER — TOPIRAMATE 25 MG PO TABS
25.0000 mg | ORAL_TABLET | Freq: Two times a day (BID) | ORAL | Status: DC
  Administered 2018-11-11 – 2018-11-13 (×5): 25 mg via ORAL
  Filled 2018-11-11 (×5): qty 1

## 2018-11-11 MED ORDER — SODIUM CHLORIDE 0.9 % IV BOLUS
500.0000 mL | Freq: Once | INTRAVENOUS | Status: AC
  Administered 2018-11-11: 500 mL via INTRAVENOUS

## 2018-11-11 MED ORDER — FERROUS SULFATE 325 (65 FE) MG PO TABS
325.0000 mg | ORAL_TABLET | Freq: Three times a day (TID) | ORAL | Status: DC
  Administered 2018-11-11 – 2018-11-13 (×8): 325 mg via ORAL
  Filled 2018-11-11 (×8): qty 1

## 2018-11-11 MED ORDER — SODIUM CHLORIDE 0.9 % IV SOLN
INTRAVENOUS | Status: DC
  Administered 2018-11-11 – 2018-11-12 (×2): via INTRAVENOUS

## 2018-11-11 MED ORDER — ADULT MULTIVITAMIN W/MINERALS CH
1.0000 | ORAL_TABLET | Freq: Every day | ORAL | Status: DC
  Administered 2018-11-11 – 2018-11-13 (×3): 1 via ORAL
  Filled 2018-11-11 (×3): qty 1

## 2018-11-11 MED ORDER — SODIUM CHLORIDE 0.9 % IV SOLN
1.0000 g | INTRAVENOUS | Status: DC
  Administered 2018-11-11 – 2018-11-12 (×2): 1 g via INTRAVENOUS
  Filled 2018-11-11: qty 10
  Filled 2018-11-11: qty 1
  Filled 2018-11-11: qty 10

## 2018-11-11 MED ORDER — NICOTINE 7 MG/24HR TD PT24
7.0000 mg | MEDICATED_PATCH | Freq: Every day | TRANSDERMAL | Status: DC
  Administered 2018-11-11 – 2018-11-13 (×3): 7 mg via TRANSDERMAL
  Filled 2018-11-11 (×3): qty 1

## 2018-11-11 NOTE — Progress Notes (Signed)
PROGRESS NOTE    Jaclyn Hart  [id number] DOB: [birth year] DOA: 11/10/2018 PCP: Neale Burly, MD  Brief Narrative:  The patient is a 68 year old Caucasian female with a past medical history significant for but not limited to depression and anxiety, GERD, obstructive sleep apnea, history of CVA, hypertension, tobacco abuse, CAD status post PCI to LAD with DES other comorbidities who presented with a chief complaint of urinary bleeding.  Of note she had recent admission and discharge from 8 29-8 31 where she had a TURBT.  Still bleeding urge incontinence as well as urgency and was found to have a grade 2 cystocele at that time as well by Dr. Patsy Baltimore at that cystoscopy procedure will be needed and pathology results from 830 showed benign urothelium.  She represented to [facility] long emergency room with a chief complaint of gross hematuria strangury, and having large blood clots from urine.  She states that she has not been feeling well since her procedure and in the ED she had a three-way Foley catheter inserted with CBD started with clear yellow urine.  Foley catheter had not been removed patient continues to feel weak so PT OT has been consulted.  Patient was found to have an AKI in renal function is improving he will be restarted on IV fluid hydration and IV antibiotics have been restarted given her urine culture is still pending.  Assessment & Plan:   Active Problems:   Hypertension   Presence of drug coated stent in Prox LAD - Promus DES 2.75 mm x 28 mm (3.25 mm)   GERD/ PUD   UTI (urinary tract infection)- culture pending, home on Cipro   Hematuria   Gross hematuria  Gross hematuria in the setting of acute urinary tract infection -Patient had a Foley catheter in place on admission and had a three-way CBT running and CBT is now been stopped by cardiology -Appreciate urology consult and Dr. Louis Meckel recommends transitioning to oral antibiotics and discharging home however patient still  requires inpatient treatment given her acuity and weakness -Started on IV ceftriaxone given her white blood cell count low-grade temperature and urine being good nidus for infection next- -initial urinalysis unable to be done given his all blood and repeat showed that it was turbid appearance with red color, large hemoglobin, large leukocytes, many bacteria,  -Urine culture showed 100,000 colony-forming units of gram-negative rods -We will stop p.o. antibiotics and resume IV antibiotics at this time and then transition based on sensitivities -We will also obtain blood cultures x2 given patient's generalized weakness  Generalized Weakness in the setting of acute urinary tract infection -As above -We will also obtain PT OT to evaluate and treat and continue IV fluid hydration  Macrocytic anemia with risk for acute blood loss anemia -Patient's hemoglobin/hematocrit went from 11.2/37.9 is now 10.5/35.8 and relatively stable -Anemia panel checked and showed an iron level of 22, TIBC of 361, TIBC of 383, saturation ratio of 6%, ferritin level 19, for over 30.0, and vitamin B12 551 -Continue to monitor for signs and symptoms of bleeding; currently no overt bleeding noted now that her hematuria has stopped -Patient was given IV ferric gluconate 125 mg x 1; Will need to resume Home Ferrous Sulfate 325 mg po TIDwm  -Repeat CBC in a.m.  AKI in the setting of urinary tract infection as well as ACE and thiazide use -Patient's BUN/C on 10/27/18 was 33/0.90 and has worsened now to 68/2.07 -Avoid nephrotoxic medications, contrast dyes as well as hypotension -Continue  with IV fluid hydration and have started back on normal saline at 75 mL's per hour -Continue to Hold Benazepril 10 mg po daily and HCTZ 25 mg po Daily  -Patient's BUN and creatinine is now improving and is 62/1.46 -Continue to monitor and trend renal function -Repeat CMP in a.m.  History of CVA -Continue Atorvastatin 80 mg p.o. daily;  currently holding aspirin 81 mg due to GI bleeding but will resume in a.m.  CAD -History of stent 2015 -Will resume ASA 81 mg in AM -C/w Atorvastatin 80 mg po Daily, Metoprolol 50 mg po BID -Continue to Hold Benezapril 10 mg po Daily -Currently no Chest Pain   Tobacco Abuse/Smoker -Smoking Cessation Counseling given -C/w Nicotine patch 7 mcg  GERD -C/w Pantoprazole 40 mg po BID   Mild Hyperkalemia, improved -Secondary likely to AKI as well as ACE-I and now improved -C/w IVF Hydration with NS at 75 mL/hr -Repeat CMP in AM   Headache/Chronic Migraines -Patient was started on Fioricet 1 tab every 6 hours PRN for headache and will continue her home Topamax 25 mg p.o. twice daily  Anxiety -C/w Aprazolam 1 mg every 8 hours  DVT prophylaxis: SCDs Code Status: FULL CODE  Family Communication: No family present at bedside  Disposition Plan: [place] for continued workup ant treatment    Consultants:   Urology Dr. Burman Nieves   Procedures: None   Antimicrobials:  Anti-infectives (From admission, onward)   Start     Dose/Rate Route Frequency Ordered Stop   11/11/18 1200  cefTRIAXone (ROCEPHIN) 1 g in sodium chloride 0.9 % 100 mL IVPB     1 g 200 mL/hr over 30 Minutes Intravenous Every 24 hours 11/11/18 1048     11/10/18 2000  amoxicillin-clavulanate (AUGMENTIN) 500-125 MG per tablet 500 mg  Status:  Discontinued     1 tablet Oral 2 times daily 11/10/18 1347 11/11/18 1048   11/10/18 1330  amoxicillin-clavulanate (AUGMENTIN) 875-125 MG per tablet 1 tablet  Status:  Discontinued     1 tablet Oral Every 12 hours 11/10/18 1327 11/10/18 1347   11/10/18 0830  cefTRIAXone (ROCEPHIN) 1 g in sodium chloride 0.9 % 100 mL IVPB  Status:  Discontinued     1 g 200 mL/hr over 30 Minutes Intravenous Every 24 hours 11/10/18 0820 11/10/18 1327     Subjective: Patient seen and examined at bedside and states that she still feels weak but feels little bit better and happy that she is  no longer bleeding.  No chest pain, lightheadedness or dizziness.  No nausea or vomiting.  No other concerns or plans at this time.  Patient remains somewhat anxious.  Objective: Vitals:   11/10/18 1434 11/10/18 2119 11/11/18 0554 11/11/18 0925  BP: 102/70 107/80 94/68 92/70   Pulse: 87 77 (!) 59 75  Resp: 15 20 15    Temp: 97.8 F (36.6 C) 98.4 F (36.9 C) 98.3 F (36.8 C)   TempSrc: Oral Oral Oral   SpO2: 100% 97% 98%   Weight:      Height:        Intake/Output Summary (Last 24 hours) at 11/11/2018 1123 Last data filed at 11/11/2018 1000 Gross per 24 hour  Intake 717.25 ml  Output 5750 ml  Net -5032.75 ml   Filed Weights   11/09/18 2353 11/10/18 1230  Weight: 61.2 kg 61 kg   Examination: Physical Exam:  Constitutional: WN/WD Caucasian female  NAD and appears anxious but is comfortable Eyes: Lids and conjunctivae normal, sclerae anicteric  ENMT: External Ears, Nose appear normal. Grossly normal hearing.  Neck: Appears normal, supple, no cervical masses, normal ROM, no appreciable thyromegaly; no JVD Respiratory: Diminished to auscultation bilaterally, no wheezing, rales, rhonchi or crackles. Normal respiratory effort and patient is not tachypenic. No accessory muscle use.  Cardiovascular: RRR, no murmurs / rubs / gallops. S1 and S2 auscultated. Trace extremity edema. 2+ pedal pulses. No carotid bruits.  Abdomen: Soft, Tender to palpate in the lower abdomen, non-distended. No masses palpated. No appreciable hepatosplenomegaly. Bowel sounds positive x4.  GU: Deferred. Foley has been removed Musculoskeletal: No clubbing / cyanosis of digits/nails. No joint deformity upper and lower extremities.  Skin: No rashes, lesions, ulcers on a limited skin evaluation. No induration; Warm and dry.  Neurologic: CN 2-12 grossly intact with no focal deficits. Romberg sign and cerebellar reflexes not assessed.  Psychiatric: Normal judgment and insight. Alert and oriented x 3. Anxious mood and  appropriate affect.   Data Reviewed: I have personally reviewed following labs and imaging studies  CBC: Recent Labs  Lab 11/10/18 0315 11/10/18 1232 11/10/18 1735 11/11/18 0856  WBC 12.2*  --   --  7.3  NEUTROABS  --   --   --  5.0  HGB 11.2* 10.7* 10.7* 10.5*  HCT 37.9 36.2 35.3* 35.8*  MCV 106.5*  --   --  105.6*  PLT 350  --   --  [id number]   Basic Metabolic Panel: Recent Labs  Lab 11/10/18 0315 11/11/18 0856  NA 137 137  K 5.2* 4.4  CL 110 113*  CO2 13* 15*  GLUCOSE 97 116*  BUN 68* 62*  CREATININE 2.07* 1.46*  CALCIUM 9.2 8.9  MG  --  2.4  PHOS  --  3.7   GFR: Estimated Creatinine Clearance: 31.7 mL/min (A) (by C-G formula based on SCr of 1.46 mg/dL (H)). Liver Function Tests: Recent Labs  Lab 11/11/18 0856  AST 13*  ALT 11  ALKPHOS 131*  BILITOT 0.3  PROT 6.7  ALBUMIN 3.5   No results for input(s): LIPASE, AMYLASE in the last 168 hours. No results for input(s): AMMONIA in the last 168 hours. Coagulation Profile: No results for input(s): INR, PROTIME in the last 168 hours. Cardiac Enzymes: No results for input(s): CKTOTAL, CKMB, CKMBINDEX, TROPONINI in the last 168 hours. BNP (last 3 results) No results for input(s): PROBNP in the last 8760 hours. HbA1C: No results for input(s): HGBA1C in the last 72 hours. CBG: No results for input(s): GLUCAP in the last 168 hours. Lipid Profile: No results for input(s): CHOL, HDL, LDLCALC, TRIG, CHOLHDL, LDLDIRECT in the last 72 hours. Thyroid Function Tests: No results for input(s): TSH, T4TOTAL, FREET4, T3FREE, THYROIDAB in the last 72 hours. Anemia Panel: Recent Labs    11/10/18 1232  VITAMINB12 551  FOLATE 13.0  FERRITIN 19  TIBC 383  IRON 22*  RETICCTPCT 2.8   Sepsis Labs: No results for input(s): PROCALCITON, LATICACIDVEN in the last 168 hours.  Recent Results (from the past 240 hour(s))  SARS Coronavirus 2 [facility] order, Performed in [facility] hospital lab) Nasopharyngeal Nasopharyngeal Swab      Status: None   Collection Time: 11/10/18  6:10 AM   Specimen: Nasopharyngeal Swab  Result Value Ref Range Status   SARS Coronavirus 2 NEGATIVE NEGATIVE Final    Comment: (NOTE) If result is NEGATIVE SARS-CoV-2 target nucleic acids are NOT DETECTED. The SARS-CoV-2 RNA is generally detectable in upper and lower  respiratory specimens during the acute phase of infection.  The lowest  concentration of SARS-CoV-2 viral copies this assay can detect is 250  copies / mL. A negative result does not preclude SARS-CoV-2 infection  and should not be used as the sole basis for treatment or other  patient management decisions.  A negative result may occur with  improper specimen collection / handling, submission of specimen other  than nasopharyngeal swab, presence of viral mutation(s) within the  areas targeted by this assay, and inadequate number of viral copies  (<250 copies / mL). A negative result must be combined with clinical  observations, patient history, and epidemiological information. If result is POSITIVE SARS-CoV-2 target nucleic acids are DETECTED. The SARS-CoV-2 RNA is generally detectable in upper and lower  respiratory specimens dur ing the acute phase of infection.  Positive  results are indicative of active infection with SARS-CoV-2.  Clinical  correlation with patient history and other diagnostic information is  necessary to determine patient infection status.  Positive results do  not rule out bacterial infection or co-infection with other viruses. If result is PRESUMPTIVE POSTIVE SARS-CoV-2 nucleic acids MAY BE PRESENT.   A presumptive positive result was obtained on the submitted specimen  and confirmed on repeat testing.  While 2019 novel coronavirus  (SARS-CoV-2) nucleic acids may be present in the submitted sample  additional confirmatory testing may be necessary for epidemiological  and / or clinical management purposes  to differentiate between  SARS-CoV-2 and other  Sarbecovirus currently known to infect humans.  If clinically indicated additional testing with an alternate test  methodology [phone]) is advised. The SARS-CoV-2 RNA is generally  detectable in upper and lower respiratory sp ecimens during the acute  phase of infection. The expected result is Negative. Fact Sheet for Patients:  StrictlyIdeas.no Fact Sheet for Healthcare Providers: BankingDealers.co.za This test is not yet approved or cleared by the [place] FDA and has been authorized for detection and/or diagnosis of SARS-CoV-2 by FDA under an Emergency Use Authorization (EUA).  This EUA will remain in effect (meaning this test can be used) for the duration of the COVID-19 declaration under Section 564(b)(1) of the Act, 21 U.S.C. section 360bbb-3(b)(1), unless the authorization is terminated or revoked sooner. Performed at [facility], [place] [address] [place], [place] [address]   Culture, Urine     Status: Abnormal (Preliminary result)   Collection Time: 11/10/18  8:21 AM   Specimen: Urine, Random  Result Value Ref Range Status   Specimen Description   Final    URINE, RANDOM Performed at [facility], [place] [address] [place], [place] [address]    Special Requests   Final    NONE Performed at [facility], [place] [address], [place], [place] [address]    Culture (A)  Final    >=100,000 COLONIES/mL GRAM NEGATIVE RODS CULTURE REINCUBATED FOR BETTER GROWTH Performed at [facility], [place] [address] [place], [place] [address]    Report Status PENDING  Incomplete    Radiology Studies: US Renal  Result Date: 11/10/2018 CLINICAL DATA:  68 year old female with history of acute renal failure. Hematuria. EXAM: RENAL / URINARY TRACT ULTRASOUND COMPLETE COMPARISON:  No prior. FINDINGS: Right Kidney: Renal measurements: 11.2 x 5.0 x 5.0 = volume: 147 mL . Echogenicity within  normal limits. 1.1 x 1.6 x 1.5 cm anechoic lesion with increased through transmission at the junction of upper and interpolar region of the right kidney, compatible with a simple cyst. No hydronephrosis visualized. Left Kidney: Renal  measurements: 7.2 x 3.3 x 2.6 = volume: 32 mL. Atrophy with diffuse cortical thinning and increased echogenicity in the renal parenchyma, indicative of medical renal disease. No hydronephrosis. Bladder: Foley balloon catheter inside a decompressed urinary bladder. IMPRESSION: 1. Left renal atrophy with increased cortical echogenicity indicative of medical renal disease. 2. No hydronephrosis to suggest urinary tract obstruction. 3. Simple cyst in the right kidney, as above. Electronically Signed   By: Vinnie Langton M.D.   On: 11/10/2018 08:51   Scheduled Meds: . sodium chloride   Intravenous Once  . ALPRAZolam  1 mg Oral Q8H  . atorvastatin  80 mg Oral q1800  . ferrous sulfate  325 mg Oral TID WC  . metoprolol tartrate  50 mg Oral BID  . multivitamin with minerals  1 tablet Oral Daily  . nicotine  7 mg Transdermal Daily  . pantoprazole  40 mg Oral BID  . topiramate  25 mg Oral BID   Continuous Infusions: . sodium chloride Stopped (11/11/18 0944)  . cefTRIAXone (ROCEPHIN)  IV      LOS: 1 day   Kerney Elbe, DO [facility] PAGER is on AMION  If 7PM-7AM, please contact night-coverage [email or web address] Password [facility] 11/11/2018, 11:23 AM

## 2018-11-11 NOTE — Evaluation (Signed)
Physical Therapy Evaluation Patient Details Name: Jaclyn Hart MRN: [id number] DOB: 18-Feb-1951 Today's Date: 11/11/2018   History of Present Illness  68 yo female s/p anxiety, depression, headaches, HTN, CVA, L TKR 02/2018, R THA 1970.  Clinical Impression  Pt presents with generalized weakness, increased time and effort to perform mobility tasks, unsteadiness in standing, and decreased activity tolerance. Pt to benefit from acute PT to address deficits. Pt ambulated hallway distance with RW with min guard assist, pt requiring cuing for safety during ambulation. PT recommending home with HHPT and d/c to daughter's house, as pt lives alone. PT to progress mobility as tolerated, and will continue to follow acutely.      Follow Up Recommendations Home health PT;Supervision/Assistance - 24 hour    Equipment Recommendations  None recommended by PT    Recommendations for Other Services       Precautions / Restrictions Precautions Precautions: Fall Restrictions Weight Bearing Restrictions: No      Mobility  Bed Mobility Overal bed mobility: Needs Assistance Bed Mobility: Supine to Sit     Supine to sit: HOB elevated;Supervision     General bed mobility comments: supervision for safety, increased time and effort. Pt able to sit up and put her own socks on in bed.  Transfers Overall transfer level: Needs assistance Equipment used: Rolling walker (2 wheeled) Transfers: Sit to/from Stand Sit to Stand: Min guard;From elevated surface         General transfer comment: min guard for safety, verbal cuing for hand placement when rising.  Ambulation/Gait Ambulation/Gait assistance: Min guard Gait Distance (Feet): 100 Feet Assistive device: Rolling walker (2 wheeled) Gait Pattern/deviations: Step-through pattern;Antalgic;Decreased stride length;Trunk flexed Gait velocity: decr   General Gait Details: Min guard for safety. Verbal cuing for placement in and turning with RW. Pt  with increasingly antaglic gait due to L knee.  Stairs            Wheelchair Mobility    Modified Rankin (Stroke Patients Only)       Balance Overall balance assessment: Needs assistance Sitting-balance support: No upper extremity supported Sitting balance-Leahy Scale: Good     Standing balance support: No upper extremity supported Standing balance-Leahy Scale: Fair Standing balance comment: able to stand unsupported when stepping toward HOB, cannot accept challenge                             Pertinent Vitals/Pain Pain Assessment: Faces Faces Pain Scale: Hurts a little bit Pain Location: abdomen Pain Descriptors / Indicators: Squeezing Pain Intervention(s): Limited activity within patient's tolerance;Monitored during session;Repositioned    Home Living Family/patient expects to be discharged to:: Private residence Living Arrangements: Alone Available Help at Discharge: Family;Available PRN/intermittently(daughter) Type of Home: House Home Access: Stairs to enter   Entrance Stairs-Number of Steps: 2 Home Layout: One level Home Equipment: Walker - 4 wheels;Bedside commode;Walker - 2 wheels      Prior Function Level of Independence: Independent with assistive device(s)         Comments: pt reports using rollator for ambulation most of the time     Hand Dominance   Dominant Hand: Right    Extremity/Trunk Assessment   Upper Extremity Assessment Upper Extremity Assessment: Overall WFL for tasks assessed    Lower Extremity Assessment Lower Extremity Assessment: Generalized weakness    Cervical / Trunk Assessment Cervical / Trunk Assessment: Normal  Communication   Communication: No difficulties  Cognition Arousal/Alertness: Awake/alert Behavior During  Therapy: WFL for tasks assessed/performed Overall Cognitive Status: No family/caregiver present to determine baseline cognitive functioning                                  General Comments: Pt easily distracted during eval and at times requires repeated cuing.      General Comments      Exercises     Assessment/Plan    PT Assessment Patient needs continued PT services  PT Problem List Decreased strength;Decreased mobility;Decreased activity tolerance;Decreased balance;Decreased knowledge of use of DME;Pain       PT Treatment Interventions DME instruction;Therapeutic activities;Gait training;Therapeutic exercise;Patient/family education;Balance training;Stair training;Functional mobility training    PT Goals (Current goals can be found in the Care Plan section)  Acute Rehab PT Goals Patient Stated Goal: get stronger PT Goal Formulation: With patient Time For Goal Achievement: 11/25/18 Potential to Achieve Goals: Good    Frequency Min 3X/week   Barriers to discharge        Co-evaluation               AM-PAC PT "6 Clicks" Mobility  Outcome Measure Help needed turning from your back to your side while in a flat bed without using bedrails?: A Little Help needed moving from lying on your back to sitting on the side of a flat bed without using bedrails?: A Little Help needed moving to and from a bed to a chair (including a wheelchair)?: A Little Help needed standing up from a chair using your arms (e.g., wheelchair or bedside chair)?: A Little Help needed to walk in hospital room?: A Little Help needed climbing 3-5 steps with a railing? : A Little 6 Click Score: 18    End of Session Equipment Utilized During Treatment: Gait belt Activity Tolerance: Patient tolerated treatment well Patient left: in bed;with bed alarm set;with call bell/phone within reach Nurse Communication: Mobility status PT Visit Diagnosis: Difficulty in walking, not elsewhere classified (R26.2);Muscle weakness (generalized) (M62.81)    Time: 1901-1920 PT Time Calculation (min) (ACUTE ONLY): 19 min   Charges:   PT Evaluation $PT Eval Low Complexity: 1 Low          Suhaan Perleberg Conception Chancy, PT Acute Rehabilitation Services Pager [phone]  Office [phone]   Roxine Caddy D Elonda Husky 11/11/2018, 7:36 PM

## 2018-11-12 LAB — COMPREHENSIVE METABOLIC PANEL
ALT: 12 U/L (ref 0–44)
AST: 14 U/L — ABNORMAL LOW (ref 15–41)
Albumin: 3.1 g/dL — ABNORMAL LOW (ref 3.5–5.0)
Alkaline Phosphatase: 131 U/L — ABNORMAL HIGH (ref 38–126)
Anion gap: 8 (ref 5–15)
BUN: 45 mg/dL — ABNORMAL HIGH (ref 8–23)
CO2: 16 mmol/L — ABNORMAL LOW (ref 22–32)
Calcium: 8.7 mg/dL — ABNORMAL LOW (ref 8.9–10.3)
Chloride: 116 mmol/L — ABNORMAL HIGH (ref 98–111)
Creatinine, Ser: 1.16 mg/dL — ABNORMAL HIGH (ref 0.44–1.00)
GFR calc Af Amer: 56 mL/min — ABNORMAL LOW (ref >60)
GFR calc non Af Amer: 48 mL/min — ABNORMAL LOW (ref >60)
Glucose, Bld: 108 mg/dL — ABNORMAL HIGH (ref 70–99)
Potassium: 4 mmol/L (ref 3.5–5.1)
Sodium: 140 mmol/L (ref 135–145)
Total Bilirubin: 0.3 mg/dL (ref 0.3–1.2)
Total Protein: 5.9 g/dL — ABNORMAL LOW (ref 6.5–8.1)

## 2018-11-12 LAB — CBC WITH DIFFERENTIAL/PLATELET
Abs Immature Granulocytes: 0.05 10*3/uL (ref 0.00–0.07)
Basophils Absolute: 0 10*3/uL (ref 0.0–0.1)
Basophils Relative: 0 %
Eosinophils Absolute: 0.3 10*3/uL (ref 0.0–0.5)
Eosinophils Relative: 4 %
HCT: 32.3 % — ABNORMAL LOW (ref 36.0–46.0)
Hemoglobin: 9.7 g/dL — ABNORMAL LOW (ref 12.0–15.0)
Immature Granulocytes: 1 %
Lymphocytes Relative: 18 %
Lymphs Abs: 1.4 10*3/uL (ref 0.7–4.0)
MCH: 31 pg (ref 26.0–34.0)
MCHC: 30 g/dL (ref 30.0–36.0)
MCV: 103.2 fL — ABNORMAL HIGH (ref 80.0–100.0)
Monocytes Absolute: 0.6 10*3/uL (ref 0.1–1.0)
Monocytes Relative: 7 %
Neutro Abs: 5.3 10*3/uL (ref 1.7–7.7)
Neutrophils Relative %: 70 %
Platelets: 292 10*3/uL (ref 150–400)
RBC: 3.13 MIL/uL — ABNORMAL LOW (ref 3.87–5.11)
RDW: 14.8 % (ref 11.5–15.5)
WBC: 7.7 10*3/uL (ref 4.0–10.5)
nRBC: 0 % (ref 0.0–0.2)

## 2018-11-12 LAB — MAGNESIUM: Magnesium: 1.9 mg/dL (ref 1.7–2.4)

## 2018-11-12 LAB — PHOSPHORUS: Phosphorus: 3.1 mg/dL (ref 2.5–4.6)

## 2018-11-12 MED ORDER — ASPIRIN EC 81 MG PO TBEC
81.0000 mg | DELAYED_RELEASE_TABLET | Freq: Every day | ORAL | Status: DC
  Administered 2018-11-12 – 2018-11-13 (×2): 81 mg via ORAL
  Filled 2018-11-12 (×2): qty 1

## 2018-11-12 MED ORDER — SODIUM BICARBONATE-DEXTROSE 150-5 MEQ/L-% IV SOLN
150.0000 meq | INTRAVENOUS | Status: DC
  Administered 2018-11-12 (×2): 150 meq via INTRAVENOUS
  Filled 2018-11-12 (×3): qty 1000

## 2018-11-12 NOTE — Evaluation (Signed)
Occupational Therapy Evaluation Patient Details Name: Jaclyn Hart MRN: [id number] DOB: 06-28-1950 Today's Date: 11/12/2018    History of Present Illness 68 yo female s/p anxiety, depression, headaches, HTN, CVA, L TKR 02/2018, R THA 1970. Pt admitted for Gross hematuria in the setting of acute urinary tract infection   Clinical Impression   This 68 y/o female presents with the above. PTA pt reports mod independence with ADL and functional mobility using rollator. Pt currently requiring minA for mobility using RW, completing ADL at [place] level today including LB and standing grooming ADL. Pt lives alone but reports daughter checks in daily, reports can stay with daughter initially if needed. Pt will benefit from continued acute OT services to further maximize her safety and independence with ADL and mobility. Will follow.     Follow Up Recommendations  Supervision/Assistance - 24 hour;No OT follow up(24hr initially)    Equipment Recommendations  None recommended by OT           Precautions / Restrictions Precautions Precautions: Fall Restrictions Weight Bearing Restrictions: No      Mobility Bed Mobility Overal bed mobility: Needs Assistance Bed Mobility: Supine to Sit     Supine to sit: HOB elevated;Supervision     General bed mobility comments: supervision for safety, lines  Transfers Overall transfer level: Needs assistance Equipment used: Rolling walker (2 wheeled) Transfers: Sit to/from Stand Sit to Stand: Min guard         General transfer comment: min guard for safety, verbal cuing for hand placement when rising.    Balance Overall balance assessment: Needs assistance Sitting-balance support: No upper extremity supported Sitting balance-Leahy Scale: Good     Standing balance support: No upper extremity supported Standing balance-Leahy Scale: Fair Standing balance comment: able to stand unsupported statically, use of UE support with prolonged  standing or dynamic mobility                           ADL either performed or assessed with clinical judgement   ADL Overall ADL's : Needs assistance/impaired Eating/Feeding: Independent;Sitting   Grooming: Oral care;Brushing hair;Min guard;Minimal assistance;Standing Grooming Details (indicate cue type and reason): minguard-minA for standing balance; completing tasks standing at sink in bathroom Upper Body Bathing: Set up;Min guard;Sitting   Lower Body Bathing: Min guard;Sit to/from stand   Upper Body Dressing : Set up;Sitting   Lower Body Dressing: Min guard;Sit to/from stand Lower Body Dressing Details (indicate cue type and reason): pt is easily able to adjust socks via figure 4 technique, completing prior to and post mobility within room Toilet Transfer: Minimal assistance;Ambulation;RW Toilet Transfer Details (indicate cue type and reason): simulated via transfer to recliner Toileting- Clothing Manipulation and Hygiene: Min guard;Minimal assistance;Sit to/from stand       Functional mobility during ADLs: Minimal assistance;Rolling walker General ADL Comments: pt mildly unsteady with mobility     Vision         Perception     Praxis      Pertinent Vitals/Pain Pain Assessment: Faces Faces Pain Scale: Hurts a little bit Pain Location: back, knee Pain Descriptors / Indicators: Discomfort Pain Intervention(s): Repositioned;Monitored during session     Hand Dominance Right   Extremity/Trunk Assessment Upper Extremity Assessment Upper Extremity Assessment: Generalized weakness   Lower Extremity Assessment Lower Extremity Assessment: Defer to PT evaluation       Communication Communication Communication: No difficulties   Cognition Arousal/Alertness: Awake/alert Behavior During Therapy: [facility] for tasks assessed/performed  Overall Cognitive Status: No family/caregiver present to determine baseline cognitive functioning                                  General Comments: Pt easily distracted during eval and at times requires repeated cuing/redirection to task at hand   General Comments       Exercises     Shoulder Instructions      Home Living Family/patient expects to be discharged to:: Private residence Living Arrangements: Alone Available Help at Discharge: Family;Available PRN/intermittently(daughter) Type of Home: House Home Access: Stairs to enter [employer] of Steps: 2   Home Layout: One level     Bathroom Shower/Tub: Teacher, early years/pre: Standard     Home Equipment: Environmental consultant - 4 wheels;Bedside commode;Walker - 2 wheels;Shower seat          Prior Functioning/Environment Level of Independence: Independent with assistive device(s)        Comments: pt reports using rollator for ambulation most of the time        OT Problem List: Decreased strength;Decreased range of motion;Decreased activity tolerance;Impaired balance (sitting and/or standing);Decreased cognition;Decreased knowledge of use of DME or AE;Decreased safety awareness;Pain      OT Treatment/Interventions: Self-care/ADL training;Therapeutic exercise;Neuromuscular education;Therapeutic activities;Patient/family education;Balance training;DME and/or AE instruction    OT Goals(Current goals can be found in the care plan section) Acute Rehab OT Goals Patient Stated Goal: get stronger OT Goal Formulation: With patient Time For Goal Achievement: 11/26/18 Potential to Achieve Goals: Good  OT Frequency: Min 2X/week   Barriers to D/C:            Co-evaluation              AM-PAC OT "6 Clicks" Daily Activity     Outcome Measure Help from another person eating meals?: None Help from another person taking care of personal grooming?: A Little Help from another person toileting, which includes using toliet, bedpan, or urinal?: A Little Help from another person bathing (including washing, rinsing, drying)?:  A Little Help from another person to put on and taking off regular upper body clothing?: None Help from another person to put on and taking off regular lower body clothing?: A Little 6 Click Score: 20   End of Session Equipment Utilized During Treatment: Gait belt;Rolling walker Nurse Communication: Mobility status  Activity Tolerance: Patient tolerated treatment well Patient left: in chair;with call bell/phone within reach;Other (comment)(MD present)  OT Visit Diagnosis: Muscle weakness (generalized) (M62.81);Unsteadiness on feet (R26.81)                Time: [id number] OT Time Calculation (min): 28 min Charges:  OT General Charges $OT Visit: 1 Visit OT Evaluation $OT Eval Low Complexity: 1 Low OT Treatments $Self Care/Home Management : 8-22 mins  Lou Cal, OT [facility] Pager [phone] Office [place] 11/12/2018, 9:57 AM

## 2018-11-12 NOTE — Progress Notes (Signed)
PROGRESS NOTE    Jaclyn Hart  [id number] DOB: 05/29/1950 DOA: 11/10/2018 PCP: Neale Burly, MD  Brief Narrative:  The patient is a 68 year old Caucasian female with a past medical history significant for but not limited to depression and anxiety, GERD, obstructive sleep apnea, history of CVA, hypertension, tobacco abuse, CAD status post PCI to LAD with DES other comorbidities who presented with a chief complaint of urinary bleeding.  Of note she had recent admission and discharge from 8 29-8 31 where she had a TURBT.  Still bleeding urge incontinence as well as urgency and was found to have a grade 2 cystocele at that time as well by Dr. Patsy Baltimore at that cystoscopy procedure will be needed and pathology results from 830 showed benign urothelium.  She represented to [facility] long emergency room with a chief complaint of gross hematuria strangury, and having large blood clots from urine.  She states that she has not been feeling well since her procedure and in the ED she had a three-way Foley catheter inserted with CBD started with clear yellow urine.  Foley catheter had not been removed patient continues to feel weak so PT OT has been consulted.  Patient was found to have an AKI in renal function is improving he will be restarted on IV fluid hydration and IV antibiotics have been restarted.  Urine culture is growing Citrobacter Freundii and growing greater than 100,000 colony-forming units and is pansensitive and only resistant to cefazolin and Bactrim.  Still has an AKI we will continue IV fluid hydration and also the metabolic acidosis for which we have started sodium bicarbonate.  Assessment & Plan:   Active Problems:   Hypertension   Presence of drug coated stent in Prox LAD - Promus DES 2.75 mm x 28 mm (3.25 mm)   GERD/ PUD   UTI (urinary tract infection)- culture pending, home on Cipro   Hematuria   Gross hematuria   Gross hematuria in the setting of acute urinary tract infection,  improving -Patient had a Foley catheter in place on admission and had a three-way CBT running and CBT is now been stopped by cardiology -Appreciate urology consult and Dr. Louis Meckel recommends transitioning to oral antibiotics and discharging home however patient still requires inpatient treatment given her acuity and weakness -Started on IV ceftriaxone given her white blood cell count low-grade temperature and urine being good nidus for infection next- -initial urinalysis unable to be done given his all blood and repeat showed that it was turbid appearance with red color, large hemoglobin, large leukocytes, many bacteria,  -WBC has trended down from 12.2 -> 7.7 -Urine culture showed 100,000 colony-forming units of gram-negative rods and it grew out Citrobacter Freundii -We will stop p.o. antibiotics and resume IV antibiotics at this time and then transition based on sensitivities but for now will continue IV Ceftriaxone -Obtain blood cultures x2 given patient's generalized weakness and showed NGTD <24 hours -Anticipating transition to po Abx tomorrow with D/C home if Renal Fxn is Improved and Metabolic Acidosis is Resolved  Generalized Weakness in the setting of acute urinary tract infection -As above -We will also obtain PT OT to evaluate and treat and continue IV fluid hydration and change to Sodium Bicarbonate -PT/OT recommending Home Health and 24 Hour Supervision  Macrocytic Anemia with risk for acute blood loss anemia -Patient's hemoglobin/hematocrit went from 11.2/37.9 is now 9.7/32.3 and ? Dilutional Drop  -Anemia panel checked and showed an iron level of 22, TIBC of 361, TIBC of  383, saturation ratio of 6%, ferritin level 19, for over 30.0, and vitamin B12 551 -Continue to monitor for signs and symptoms of bleeding; currently no overt bleeding noted now that her hematuria has stopped -Patient was given IV ferric gluconate 125 mg x 1; Will need to resume Home Ferrous Sulfate 325 mg po  TIDwm  -Will Resume ASA today  -Repeat CBC in a.m.  AKI in the setting of urinary tract infection as well as ACE and thiazide use, improving  -Patient's BUN/Cr on 10/27/18 was 33/0.90 and has worsened now to 68/2.07 -Avoid nephrotoxic medications, contrast dyes as well as hypotension -Continue with IV fluid hydration and was started back on normal saline at 75 mL's per hour yesterday but will change to IV Sodium Bicarbonate with 150 mEQ in D5W at 75 mL/hr for today  -Continue to Hold Benazepril 10 mg po daily and HCTZ 25 mg po Daily  -Patient's BUN and creatinine is now improving and is 45/1.16 -Continue to monitor and trend renal function -Repeat CMP in a.m.  History of CVA -Continue Atorvastatin 80 mg p.o. daily; currently was holding aspirin 81 mg due to GI bleeding but will resume this AM  CAD -History of stent 2015 -Will resume ASA 81 mg this AM given that Urine is relatively improved  -C/w Atorvastatin 80 mg po Daily, Metoprolol 50 mg po BID -Continue to Hold Benezapril 10 mg po Daily -Currently no Chest Pain   Tobacco Abuse/Smoker -Smoking Cessation Counseling given -C/w Nicotine patch 7 mcg  GERD -C/w Pantoprazole 40 mg po BID   Mild Hyperkalemia, improved -Secondary likely to AKI as well as ACE-I and now improved -C/w IVF Hydration but changed as above -Repeat CMP in AM   Headache/Chronic Migraines -Patient was started on Fioricet 1 tab every 6 hours PRN for headache and will continue her home Topamax 25 mg p.o. twice daily  Anxiety -C/w Aprazolam 1 mg every 8 hours  Metabolic Acidosis, improving  -Patient's CO2 is now 16, Chloride is 116, and AG is 8 -In the setting of Renal Failure and now Hyperchloremia -Change IVF with NS at 75 mL/hr to Sodium Bicarbonate 150 mEQ at 75 mL/hr  -Continue to Monitor and Trend -Repeat CMP in AM   DVT prophylaxis: SCDs Code Status: FULL CODE  Family Communication: No family present at bedside  Disposition Plan: Remain  Inpatient for continued workup ant treatment and anticipate discharging home in the a.m. metabolic acidosis improved along with her renal function and she is changed to p.o. antibiotics.  Consultants:   Urology Dr. Burman Nieves   Procedures: None   Antimicrobials:  Anti-infectives (From admission, onward)   Start     Dose/Rate Route Frequency Ordered Stop   11/11/18 1200  cefTRIAXone (ROCEPHIN) 1 g in sodium chloride 0.9 % 100 mL IVPB     1 g 200 mL/hr over 30 Minutes Intravenous Every 24 hours 11/11/18 1048     11/10/18 2000  amoxicillin-clavulanate (AUGMENTIN) 500-125 MG per tablet 500 mg  Status:  Discontinued     1 tablet Oral 2 times daily 11/10/18 1347 11/11/18 1048   11/10/18 1330  amoxicillin-clavulanate (AUGMENTIN) 875-125 MG per tablet 1 tablet  Status:  Discontinued     1 tablet Oral Every 12 hours 11/10/18 1327 11/10/18 1347   11/10/18 0830  cefTRIAXone (ROCEPHIN) 1 g in sodium chloride 0.9 % 100 mL IVPB  Status:  Discontinued     1 g 200 mL/hr over 30 Minutes Intravenous Every 24 hours 11/10/18 0820  11/10/18 1327     Subjective: Patient seen and examined still feeling weak but was working with therapy.  Thinks her urine was little bit darker but to me it looks fairly clear and no gross hematuria noted.  She denies any chest pain, lightheadedness or dizziness.  No other concerns at this time but remains significantly anxious.  Objective: Vitals:   11/11/18 2114 11/12/18 0131 11/12/18 0522 11/12/18 0852  BP: 93/64 (!) 95/59 120/66 131/90  Pulse: 71 79 82 78  Resp: 17 17 19 14   Temp: 98.1 F (36.7 C) 98.5 F (36.9 C) 97.9 F (36.6 C)   TempSrc: Oral Oral Oral   SpO2: 100% 95% 100% 100%  Weight:      Height:        Intake/Output Summary (Last 24 hours) at 11/12/2018 1205 Last data filed at 11/12/2018 1100 Gross per 24 hour  Intake 2219.47 ml  Output 1300 ml  Net 919.47 ml   Filed Weights   11/09/18 2353 11/10/18 1230  Weight: 61.2 kg 61 kg    Examination: Physical Exam:  Constitutional: WN/WD Caucasian female in NAD and appears anxious sitting in the chair bedside Eyes: Lids and conjunctivae normal, sclerae anicteric  ENMT: External Ears, Nose appear normal. Grossly normal hearing. Mucous membranes are moist.  Neck: Appears normal, supple, no cervical masses, normal ROM, no appreciable thyromegaly; no JVD Respiratory: Diminished to auscultation bilaterally, no wheezing, rales, rhonchi or crackles. Normal respiratory effort and patient is not tachypenic. No accessory muscle use.  Cardiovascular: RRR, no murmurs / rubs / gallops. S1 and S2 auscultated. Mild extremity edema.  Abdomen: Soft, non-tender, non-distended. No masses palpated. No appreciable hepatosplenomegaly. Bowel sounds positive x4.  GU: Deferred. Urine in [place] appears fairly clear Musculoskeletal: No clubbing / cyanosis of digits/nails. No joint deformity upper and lower extremities on a limited skin evaluation.  Skin: No rashes, lesions, ulcers on a limited skin evaluation. No induration; Warm and dry.  Neurologic: CN 2-12 grossly intact with no focal deficits. Romberg sign and cerebellar reflexes not assessed.  Psychiatric: Normal judgment and insight. Alert and oriented x 3. Continues to have a anxious mood and appropriate affect.   Data Reviewed: I have personally reviewed following labs and imaging studies  CBC: Recent Labs  Lab 11/10/18 0315 11/10/18 1232 11/10/18 1735 11/11/18 0856 11/12/18 0312  WBC 12.2*  --   --  7.3 7.7  NEUTROABS  --   --   --  5.0 5.3  HGB 11.2* 10.7* 10.7* 10.5* 9.7*  HCT 37.9 36.2 35.3* 35.8* 32.3*  MCV 106.5*  --   --  105.6* 103.2*  PLT 350  --   --  298 [id number]   Basic Metabolic Panel: Recent Labs  Lab 11/10/18 0315 11/11/18 0856 11/12/18 0312  NA 137 137 140  K 5.2* 4.4 4.0  CL 110 113* 116*  CO2 13* 15* 16*  GLUCOSE 97 116* 108*  BUN 68* 62* 45*  CREATININE 2.07* 1.46* 1.16*  CALCIUM 9.2 8.9 8.7*  MG  --   2.4 1.9  PHOS  --  3.7 3.1   GFR: Estimated Creatinine Clearance: 39.9 mL/min (A) (by C-G formula based on SCr of 1.16 mg/dL (H)). Liver Function Tests: Recent Labs  Lab 11/11/18 0856 11/12/18 0312  AST 13* 14*  ALT 11 12  ALKPHOS 131* 131*  BILITOT 0.3 0.3  PROT 6.7 5.9*  ALBUMIN 3.5 3.1*   No results for input(s): LIPASE, AMYLASE in the last 168 hours. No results for  input(s): AMMONIA in the last 168 hours. Coagulation Profile: No results for input(s): INR, PROTIME in the last 168 hours. Cardiac Enzymes: No results for input(s): CKTOTAL, CKMB, CKMBINDEX, TROPONINI in the last 168 hours. BNP (last 3 results) No results for input(s): PROBNP in the last 8760 hours. HbA1C: No results for input(s): HGBA1C in the last 72 hours. CBG: No results for input(s): GLUCAP in the last 168 hours. Lipid Profile: No results for input(s): CHOL, HDL, LDLCALC, TRIG, CHOLHDL, LDLDIRECT in the last 72 hours. Thyroid Function Tests: No results for input(s): TSH, T4TOTAL, FREET4, T3FREE, THYROIDAB in the last 72 hours. Anemia Panel: Recent Labs    11/10/18 1232  VITAMINB12 551  FOLATE 13.0  FERRITIN 19  TIBC 383  IRON 22*  RETICCTPCT 2.8   Sepsis Labs: No results for input(s): PROCALCITON, LATICACIDVEN in the last 168 hours.  Recent Results (from the past 240 hour(s))  SARS Coronavirus 2 [facility] order, Performed in [facility] hospital lab) Nasopharyngeal Nasopharyngeal Swab     Status: None   Collection Time: 11/10/18  6:10 AM   Specimen: Nasopharyngeal Swab  Result Value Ref Range Status   SARS Coronavirus 2 NEGATIVE NEGATIVE Final    Comment: (NOTE) If result is NEGATIVE SARS-CoV-2 target nucleic acids are NOT DETECTED. The SARS-CoV-2 RNA is generally detectable in upper and lower  respiratory specimens during the acute phase of infection. The lowest  concentration of SARS-CoV-2 viral copies this assay can detect is 250  copies / mL. A negative result does not preclude  SARS-CoV-2 infection  and should not be used as the sole basis for treatment or other  patient management decisions.  A negative result may occur with  improper specimen collection / handling, submission of specimen other  than nasopharyngeal swab, presence of viral mutation(s) within the  areas targeted by this assay, and inadequate number of viral copies  (<250 copies / mL). A negative result must be combined with clinical  observations, patient history, and epidemiological information. If result is POSITIVE SARS-CoV-2 target nucleic acids are DETECTED. The SARS-CoV-2 RNA is generally detectable in upper and lower  respiratory specimens dur ing the acute phase of infection.  Positive  results are indicative of active infection with SARS-CoV-2.  Clinical  correlation with patient history and other diagnostic information is  necessary to determine patient infection status.  Positive results do  not rule out bacterial infection or co-infection with other viruses. If result is PRESUMPTIVE POSTIVE SARS-CoV-2 nucleic acids MAY BE PRESENT.   A presumptive positive result was obtained on the submitted specimen  and confirmed on repeat testing.  While 2019 novel coronavirus  (SARS-CoV-2) nucleic acids may be present in the submitted sample  additional confirmatory testing may be necessary for epidemiological  and / or clinical management purposes  to differentiate between  SARS-CoV-2 and other Sarbecovirus currently known to infect humans.  If clinically indicated additional testing with an alternate test  methodology [phone]) is advised. The SARS-CoV-2 RNA is generally  detectable in upper and lower respiratory sp ecimens during the acute  phase of infection. The expected result is Negative. Fact Sheet for Patients:  StrictlyIdeas.no Fact Sheet for Healthcare Providers: BankingDealers.co.za This test is not yet approved or cleared by the  [place] FDA and has been authorized for detection and/or diagnosis of SARS-CoV-2 by FDA under an Emergency Use Authorization (EUA).  This EUA will remain in effect (meaning this test can be used) for the duration of the COVID-19 declaration under Section 564(b)(1)  of the Act, 21 U.S.C. section 360bbb-3(b)(1), unless the authorization is terminated or revoked sooner. Performed at [facility], [place] [address] [place], [place] [address]   Culture, Urine     Status: Abnormal (Preliminary result)   Collection Time: 11/10/18  8:21 AM   Specimen: Urine, Random  Result Value Ref Range Status   Specimen Description   Final    URINE, RANDOM Performed at [facility], [place] [address], [place], [place] [address]    Special Requests   Final    NONE Performed at [facility], [place] [address] [place], [place] [address]    Culture (A)  Final    >=100,000 COLONIES/mL CITROBACTER FREUNDII CULTURE REINCUBATED FOR BETTER GROWTH Performed at [facility], [place] [address] [place], [place] [address]    Report Status PENDING  Incomplete   Organism ID, Bacteria CITROBACTER FREUNDII (A)  Final      Susceptibility   Citrobacter freundii - MIC*    CEFAZOLIN >=64 RESISTANT Resistant     CEFTRIAXONE <=1 SENSITIVE Sensitive     CIPROFLOXACIN <=0.25 SENSITIVE Sensitive     GENTAMICIN <=1 SENSITIVE Sensitive     IMIPENEM <=0.25 SENSITIVE Sensitive     NITROFURANTOIN <=16 SENSITIVE Sensitive     TRIMETH/SULFA >=320 RESISTANT Resistant     PIP/TAZO <=4 SENSITIVE Sensitive     * >=100,000 COLONIES/mL CITROBACTER FREUNDII  Culture, blood (routine x 2)     Status: None (Preliminary result)   Collection Time: 11/11/18 11:19 AM   Specimen: Right Antecubital; Blood  Result Value Ref Range Status   Specimen Description   Final    RIGHT ANTECUBITAL Performed at [place] [address], [place], [place] [address]     Special Requests   Final    BOTTLES DRAWN AEROBIC AND ANAEROBIC Blood Culture adequate volume Performed at [place] [address] [place], [place] [address]    Culture   Final    NO GROWTH < 24 HOURS Performed at [place] [address], [place], [place] [address]    Report Status PENDING  Incomplete  Culture, blood (routine x 2)     Status: None (Preliminary result)   Collection Time: 11/11/18 11:27 AM   Specimen: BLOOD RIGHT HAND  Result Value Ref Range Status   Specimen Description   Final    BLOOD RIGHT HAND Performed at [place] [address] [place], [place] [address]    Special Requests   Final    BOTTLES DRAWN AEROBIC AND ANAEROBIC Blood Culture adequate volume Performed at [place] [address] [place], [place] [address]    Culture   Final    NO GROWTH < 24 HOURS Performed at [place] [address] [place], [place] [address]    Report Status PENDING  Incomplete    Radiology Studies: No results found. Scheduled Meds: . sodium chloride   Intravenous Once  . ALPRAZolam  1 mg Oral Q8H  . atorvastatin  80 mg Oral q1800  . ferrous sulfate  325 mg Oral TID WC  . metoprolol tartrate  50 mg Oral BID  . multivitamin with minerals  1 tablet Oral Daily  . nicotine  7 mg Transdermal Daily  . pantoprazole  40 mg Oral BID  . topiramate  25 mg Oral BID   Continuous Infusions: . cefTRIAXone (ROCEPHIN)  IV 1 g (11/12/18 1153)  .  sodium bicarbonate 150 mEq in dextrose 5% 1000 mL 150 mEq (11/12/18 0837)    LOS: 2 days   Kerney Elbe, DO [facility] PAGER is on [place]  If 7PM-7AM, please contact night-coverage [email or web address] Password [facility] 11/12/2018, 12:05 PM

## 2018-11-13 ENCOUNTER — Other Ambulatory Visit: Payer: Self-pay | Admitting: *Deleted

## 2018-11-13 DIAGNOSIS — N179 Acute kidney failure, unspecified: Secondary | ICD-10-CM

## 2018-11-13 LAB — COMPREHENSIVE METABOLIC PANEL
ALT: 13 U/L (ref 0–44)
AST: 16 U/L (ref 15–41)
Albumin: 3 g/dL — ABNORMAL LOW (ref 3.5–5.0)
Alkaline Phosphatase: 124 U/L (ref 38–126)
Anion gap: 6 (ref 5–15)
BUN: 40 mg/dL — ABNORMAL HIGH (ref 8–23)
CO2: 23 mmol/L (ref 22–32)
Calcium: 8.5 mg/dL — ABNORMAL LOW (ref 8.9–10.3)
Chloride: 111 mmol/L (ref 98–111)
Creatinine, Ser: 0.91 mg/dL (ref 0.44–1.00)
GFR calc Af Amer: >60 mL/min (ref >60)
GFR calc non Af Amer: >60 mL/min (ref >60)
Glucose, Bld: 107 mg/dL — ABNORMAL HIGH (ref 70–99)
Potassium: 4 mmol/L (ref 3.5–5.1)
Sodium: 140 mmol/L (ref 135–145)
Total Bilirubin: 0.2 mg/dL — ABNORMAL LOW (ref 0.3–1.2)
Total Protein: 5.5 g/dL — ABNORMAL LOW (ref 6.5–8.1)

## 2018-11-13 LAB — CBC WITH DIFFERENTIAL/PLATELET
Abs Immature Granulocytes: 0.04 10*3/uL (ref 0.00–0.07)
Basophils Absolute: 0 10*3/uL (ref 0.0–0.1)
Basophils Relative: 1 %
Eosinophils Absolute: 0.3 10*3/uL (ref 0.0–0.5)
Eosinophils Relative: 4 %
HCT: 30.5 % — ABNORMAL LOW (ref 36.0–46.0)
Hemoglobin: 9.2 g/dL — ABNORMAL LOW (ref 12.0–15.0)
Immature Granulocytes: 1 %
Lymphocytes Relative: 27 %
Lymphs Abs: 2 10*3/uL (ref 0.7–4.0)
MCH: 31.1 pg (ref 26.0–34.0)
MCHC: 30.2 g/dL (ref 30.0–36.0)
MCV: 103 fL — ABNORMAL HIGH (ref 80.0–100.0)
Monocytes Absolute: 0.8 10*3/uL (ref 0.1–1.0)
Monocytes Relative: 10 %
Neutro Abs: 4.4 10*3/uL (ref 1.7–7.7)
Neutrophils Relative %: 57 %
Platelets: 282 10*3/uL (ref 150–400)
RBC: 2.96 MIL/uL — ABNORMAL LOW (ref 3.87–5.11)
RDW: 14.9 % (ref 11.5–15.5)
WBC: 7.5 10*3/uL (ref 4.0–10.5)
nRBC: 0 % (ref 0.0–0.2)

## 2018-11-13 LAB — URINE CULTURE: Culture: >=100000 — AB

## 2018-11-13 LAB — MAGNESIUM: Magnesium: 1.8 mg/dL (ref 1.7–2.4)

## 2018-11-13 LAB — PHOSPHORUS: Phosphorus: 1.9 mg/dL — ABNORMAL LOW (ref 2.5–4.6)

## 2018-11-13 MED ORDER — NITROFURANTOIN MONOHYD MACRO 100 MG PO CAPS
100.0000 mg | ORAL_CAPSULE | Freq: Two times a day (BID) | ORAL | Status: DC
  Administered 2018-11-13: 100 mg via ORAL
  Filled 2018-11-13 (×2): qty 1

## 2018-11-13 MED ORDER — SODIUM BICARBONATE 650 MG PO TABS: 650.0000 mg | ORAL_TABLET | Freq: Three times a day (TID) | ORAL | 0 refills | Status: DC

## 2018-11-13 MED ORDER — SODIUM BICARBONATE 650 MG PO TABS
650.0000 mg | ORAL_TABLET | Freq: Three times a day (TID) | ORAL | Status: DC
  Administered 2018-11-13 (×2): 650 mg via ORAL
  Filled 2018-11-13 (×3): qty 1

## 2018-11-13 MED ORDER — NITROFURANTOIN MONOHYD MACRO 100 MG PO CAPS: 100.0000 mg | ORAL_CAPSULE | Freq: Two times a day (BID) | ORAL | 0 refills | Status: AC

## 2018-11-13 MED ORDER — BENAZEPRIL HCL 10 MG PO TABS: 10.0000 mg | ORAL_TABLET | Freq: Every day | ORAL | 2 refills | Status: DC

## 2018-11-13 MED ORDER — HYDROCHLOROTHIAZIDE 25 MG PO TABS: 25.0000 mg | ORAL_TABLET | Freq: Every day | ORAL | 0 refills | Status: DC

## 2018-11-13 NOTE — Consult Note (Signed)
   [facility] Inpatient Consult   11/13/2018  Jaclyn Hart 1951-02-12 [id number]   Patient chart has been reviewed for readmissions less than 30 days and for medium risk score, 16%,* for unplanned readmissions.  Patient assessed for community [place] [facility] follow up needs.  Spoke with [name] by telephone. HIPAA verified. Explained [facility] CM services to patient. Patient states that she would like a follow up with community RN when she returns home. States that she lives alone and her daughter comes to check on her periodically. Patient stated that she would like information on receiving any meals, if possible.   Patient verbally gives consent for [facility] CM follow up. Referral placed in chart.  Of note, [facility] Management services does not replace or interfere with any services that are arranged by inpatient case management or social work.    Netta Cedars, MSN, [facility] Liaison Nurse Mobile Phone [phone]  Toll free office [phone]

## 2018-11-13 NOTE — Progress Notes (Signed)
Urine was yellow/clear all shift.

## 2018-11-13 NOTE — TOC Initial Note (Signed)
Transition of Care [facility]) - Initial/Assessment Note    Patient Details  Name: Jaclyn Hart MRN: [id number] Date of Birth: 10-20-1950  Transition of Care [facility] CM/SW Contact:    Lia Hopping, [place] Phone Number: 11/13/2018, 12:38 PM  Clinical Narrative:   Patient had recent hospital stay from 8/29-8/31 for gross hematuria with clot in bladder for which she she underwent TURBT and irrigation patient had been doing well for several weeks .  And was found to have UTI secondary to VRE and Citrobacter Freundii and AKI.   Patient lives alone in an apartment. Patient reports her daughter visits and assist with care when she can.Patient ambulates with a walker and cane. Patient agreeable to resume [facility] rep. Notified.    Expected Discharge Plan: [place] Barriers to Discharge: No Barriers Identified   Patient Goals and CMS Choice Patient states their goals for this hospitalization and ongoing recovery are:: to get better      Expected Discharge Plan and Services Expected Discharge Plan: [place] In-house Referral: Clinical Social Work   Post Acute Care Choice: [place] arrangements for the past 2 months: [place] Expected Discharge Date: 11/13/18                         HH Arranged: RN, PT [place] Agency: [facility] [employer] Date [place]: 11/13/18 Time [place]: [phone] Representative spoke with at [place]: [place] Arrangements/Services Living arrangements for the past 2 months: [place] with:: Self Patient language and need for interpreter reviewed:: No Do you feel safe going back to the place where you live?: Yes      Need for Family Participation in Patient Care: Yes (Comment) Care giver support system in place?: Yes (comment) Current home services: Home RN Criminal Activity/Legal Involvement  Pertinent to Current Situation/Hospitalization: No - Comment as needed  Activities of Daily Living Home Assistive Devices/Equipment: Gilford Rile (specify type) ADL Screening (condition at time of admission) Patient's cognitive ability adequate to safely complete daily activities?: No Is the patient deaf or have difficulty hearing?: No Does the patient have difficulty seeing, even when wearing glasses/contacts?: No Does the patient have difficulty concentrating, remembering, or making decisions?: No Patient able to express need for assistance with ADLs?: No Does the patient have difficulty dressing or bathing?: No Independently performs ADLs?: Yes (appropriate for developmental age) Does the patient have difficulty walking or climbing stairs?: No Weakness of Legs: Left Weakness of Arms/Hands: None  Permission Sought/Granted Permission sought to share information with : Case Manager                Emotional Assessment Appearance:: Appears stated age   Affect (typically observed): Accepting Orientation: : Oriented to Self, Oriented to Place, Oriented to  Time, Oriented to Situation Alcohol / Substance Use: Not Applicable Psych Involvement: No (comment)  Admission diagnosis:  Gross hematuria [R31.0] AKI (acute kidney injury) ([place]) [N17.9] Patient Active Problem List   Diagnosis Date Noted  . Gross hematuria 11/10/2018  . Hematuria 10/26/2018  . S/P left TKA 03/12/2018  . Status post total left knee replacement 03/12/2018  . Presence of drug coated stent in Prox LAD - Promus DES 2.75 mm x 28 mm (3.25 mm) 03/21/2013  . Hematoma of groin - Right; post Cath-PCI. 03/21/2013  . Abnormal EKG- AS TWI 03/21/2013  .  GERD/ PUD 03/21/2013  . UTI (urinary tract infection)- culture pending, home on Cipro 03/21/2013  . Hypertension   . Lt brain Stroke 2007   . ACS (acute coronary syndrome) ([place]) 03/19/2013   PCP:  Neale Burly, MD Pharmacy:   [place], [place] [id number] [place] [id number] Phone: [phone] Fax: [phone]     Social Determinants of Health (SDOH) Interventions    Readmission Risk Interventions No flowsheet data found.

## 2018-11-13 NOTE — Discharge Summary (Signed)
Jaclyn Hart [id number] DOB: [birth year] DOA: 11/10/2018  PCP: Neale Burly, MD  Admit date: 11/10/2018 Discharge date: 11/13/2018  Admitted From: Home Disposition:  Home  Recommendations for Outpatient Follow-up:  1. Follow up with PCP in 1-2 weeks 2. Please obtain BMP(Co2)/CBC in one week 3. Please follow up on the following pending results: 4. Macrobid x5 days, sodium bicarb 5. Instructed to hold home benazepril and HCTZ given AKI until BP and BMP check with PCP  Home Health: YES Equipment/Devices: none  Discharge Condition:Stable CODE STATUS:FULL  Diet recommendation: Heart Healthy   Brief/Interim Summary: History of present illness:  Jaclyn Hart is a 68 y.o. year old female with medical history significant for depression, anxiety, GERD, OSA, CVA, HTN, tobacco abuse, CAD who presented on 11/10/2018 with plaints of blood in her urine as well as increased urgency.  Of note patient had recent hospital stay from 8/29-8/31 for gross hematuria with clot in bladder for which she she underwent TURBT and irrigation patient had been doing well for several weeks .  And was found to have UTI secondary to VRE and Citrobacter Freundii and AKI. Remaining hospital course addressed in problem based format below:   Hospital Course:  Complicated UTI secondary to VRE and Citrobacter Freundii.  Presented with gross hematuria in setting of acute urinary retention.  Empirically started on IV ceftriaxone with sensitivities of urine culture transitioned to [facility] for coverage prior to discharge. Urology will follow up closely in clinic  Gross hematuria, recurrent. Last evaluated on 8/29-8/31. CT abd at that time showed clot in bladder and bladder wall thickening.Underwent TURBT ( during that previous hospitalization)- pathology showed benight chronic inflammation. This time had 3 way indwelling foley catheter placed in ED with continuous bladder irrigation. After discontinuing foley urine remained  clear, hgb remained stable, 9.2 on discharge, with no recurrent bleeding episodes  AKI with non gap metabolic aciodosis(related to loss from urine) improved.  Given recent urologic procedure three-way indwelling Foley catheter was placed in the ED and started on continuous bladder irrigation home benazepril and HCTZ were held as well. Creatinine improved and remained stable with discontinuation of foley. CO2 remained low and required initiation of IV sodium bicarb.  She was transitioned to oral regimen prior to discharge, will need to continue until [facility] check by PCP.   Generalized weakness, improved.  Presumably to deconditioning from hospital stay and urinary symptoms.  Home health arranged on discharge.  HTN, well controlled. Continue home metoprolol. Holding benazepril and HCTZ given AKI. Will need PCP f/u to check BP.    Consultations:  Urology  Procedures/Studies: none Subjective: feels good. No blood in urine.   Discharge Exam: Vitals:   11/12/18 2042 11/13/18 0603  BP: 104/82 108/74  Pulse: 83 69  Resp: 18 18  Temp: 98.3 F (36.8 C) 97.8 F (36.6 C)  SpO2: 100% 97%   Vitals:   11/12/18 0852 11/12/18 1328 11/12/18 2042 11/13/18 0603  BP: 131/90 (!) 106/58 104/82 108/74  Pulse: 78 70 83 69  Resp: 14 14 18 18   Temp:  97.6 F (36.4 C) 98.3 F (36.8 C) 97.8 F (36.6 C)  TempSrc:  Oral Oral Oral  SpO2: 100% 100% 100% 97%  Weight:      Height:        General: Lying in bed, no apparent distress Eyes: EOMI, anicteric ENT: Oral Mucosa clear and moist Cardiovascular: regular rate and rhythm, no murmurs, rubs or gallops, no edema, Respiratory: Normal respiratory effort on room air  Abdomen: soft, non-distended, non-tender, normal bowel sounds Skin: No Rash Neurologic: Grossly no focal neuro deficit.Mental status AAOx3, speech normal, Psychiatric:Appropriate affect, and mood  Discharge Diagnoses:  Active Problems:   Hypertension   Presence of drug coated stent in Prox  LAD - Promus DES 2.75 mm x 28 mm (3.25 mm)   GERD/ PUD   UTI (urinary tract infection)- culture pending, home on Cipro   Hematuria   Gross hematuria    Discharge Instructions  Discharge Instructions    Diet - low sodium heart healthy   Complete by: As directed    Increase activity slowly   Complete by: As directed      Allergies as of 11/13/2018      Reactions   Aspirin    Has history of ulcers; takes a low dose 81 mg daily but avoids 325 mg   Codeine Nausea And Vomiting      Medication List    STOP taking these medications   docusate sodium 100 MG capsule Commonly known as: Colace   HYDROcodone-acetaminophen 7.5-325 MG tablet Commonly known as: Norco   methocarbamol 500 MG tablet Commonly known as: Robaxin   polyethylene glycol 17 g packet Commonly known as: MIRALAX / GLYCOLAX     TAKE these medications   ALPRAZolam 0.5 MG tablet Commonly known as: XANAX Take 1 mg by mouth every 8 (eight) hours.   aspirin EC 81 MG tablet Take 81 mg by mouth daily.   atorvastatin 80 MG tablet Commonly known as: LIPITOR TAKE ONE TABLET BY MOUTH DAILY   benazepril 10 MG tablet Commonly known as: LOTENSIN Take 1 tablet (10 mg total) by mouth daily. HOLD until seen by your PCP What changed: additional instructions   butalbital-acetaminophen-caffeine 50-325-40 MG tablet Commonly known as: FIORICET Take 1 tablet by mouth every 6 (six) hours as needed for headache.   CALCIUM COMPLEX PO Take 1 tablet by mouth daily.   ferrous sulfate 325 (65 FE) MG tablet Commonly known as: FerrouSul Take 1 tablet (325 mg total) by mouth 3 (three) times daily with meals. What changed: when to take this   hydrochlorothiazide 25 MG tablet Commonly known as: HYDRODIURIL Take 1 tablet (25 mg total) by mouth daily. HOLD until seen by your PCP What changed: additional instructions   metoprolol tartrate 50 MG tablet Commonly known as: LOPRESSOR Take 50 mg by mouth 2 (two) times daily.    multivitamin with minerals Tabs tablet Take 1 tablet by mouth daily.   nitrofurantoin (macrocrystal-monohydrate) 100 MG capsule Commonly known as: MACROBID Take 1 capsule (100 mg total) by mouth every 12 (twelve) hours for 5 days.   ondansetron 4 MG tablet Commonly known as: ZOFRAN Take 4 mg by mouth every 6 (six) hours as needed for nausea or vomiting.   pantoprazole 40 MG tablet Commonly known as: PROTONIX TAKE 1 TABLET BY MOUTH TWICE DAILY   sodium bicarbonate 650 MG tablet Take 1 tablet (650 mg total) by mouth 3 (three) times daily.   topiramate 25 MG tablet Commonly known as: TOPAMAX Take 25 mg by mouth 2 (two) times daily.   traMADol 50 MG tablet Commonly known as: ULTRAM Take 50 mg by mouth every 6 (six) hours as needed for moderate pain.       Allergies  Allergen Reactions  . Aspirin     Has history of ulcers; takes a low dose 81 mg daily but avoids 325 mg  . Codeine Nausea And Vomiting  The results of significant diagnostics from this hospitalization (including imaging, microbiology, ancillary and laboratory) are listed below for reference.     Microbiology: Recent Results (from the past 240 hour(s))  SARS Coronavirus 2 [facility] order, Performed in [facility] hospital lab) Nasopharyngeal Nasopharyngeal Swab     Status: None   Collection Time: 11/10/18  6:10 AM   Specimen: Nasopharyngeal Swab  Result Value Ref Range Status   SARS Coronavirus 2 NEGATIVE NEGATIVE Final    Comment: (NOTE) If result is NEGATIVE SARS-CoV-2 target nucleic acids are NOT DETECTED. The SARS-CoV-2 RNA is generally detectable in upper and lower  respiratory specimens during the acute phase of infection. The lowest  concentration of SARS-CoV-2 viral copies this assay can detect is 250  copies / mL. A negative result does not preclude SARS-CoV-2 infection  and should not be used as the sole basis for treatment or other  patient management decisions.  A negative result  may occur with  improper specimen collection / handling, submission of specimen other  than nasopharyngeal swab, presence of viral mutation(s) within the  areas targeted by this assay, and inadequate number of viral copies  (<250 copies / mL). A negative result must be combined with clinical  observations, patient history, and epidemiological information. If result is POSITIVE SARS-CoV-2 target nucleic acids are DETECTED. The SARS-CoV-2 RNA is generally detectable in upper and lower  respiratory specimens dur ing the acute phase of infection.  Positive  results are indicative of active infection with SARS-CoV-2.  Clinical  correlation with patient history and other diagnostic information is  necessary to determine patient infection status.  Positive results do  not rule out bacterial infection or co-infection with other viruses. If result is PRESUMPTIVE POSTIVE SARS-CoV-2 nucleic acids MAY BE PRESENT.   A presumptive positive result was obtained on the submitted specimen  and confirmed on repeat testing.  While 2019 novel coronavirus  (SARS-CoV-2) nucleic acids may be present in the submitted sample  additional confirmatory testing may be necessary for epidemiological  and / or clinical management purposes  to differentiate between  SARS-CoV-2 and other Sarbecovirus currently known to infect humans.  If clinically indicated additional testing with an alternate test  methodology [phone]) is advised. The SARS-CoV-2 RNA is generally  detectable in upper and lower respiratory sp ecimens during the acute  phase of infection. The expected result is Negative. Fact Sheet for Patients:  StrictlyIdeas.no Fact Sheet for Healthcare Providers: BankingDealers.co.za This test is not yet approved or cleared by the [place] FDA and has been authorized for detection and/or diagnosis of SARS-CoV-2 by FDA under an Emergency Use Authorization (EUA).   This EUA will remain in effect (meaning this test can be used) for the duration of the COVID-19 declaration under Section 564(b)(1) of the Act, 21 U.S.C. section 360bbb-3(b)(1), unless the authorization is terminated or revoked sooner. Performed at [facility], [place] [address] [place], [place] [address]   Culture, Urine     Status: Abnormal   Collection Time: 11/10/18  8:21 AM   Specimen: Urine, Random  Result Value Ref Range Status   Specimen Description URINE, RANDOM  Final   Special Requests NONE  Final   Culture (A)  Final    >=100,000 COLONIES/mL CITROBACTER FREUNDII 50,000 COLONIES/mL VANCOMYCIN RESISTANT ENTEROCOCCUS ISOLATED    Report Status 11/13/2018 FINAL  Final   Organism ID, Bacteria CITROBACTER FREUNDII (A)  Final   Organism ID, Bacteria VANCOMYCIN RESISTANT ENTEROCOCCUS ISOLATED (A)  Final  Susceptibility   Citrobacter freundii - MIC*    CEFAZOLIN >=64 RESISTANT Resistant     CEFTRIAXONE <=1 SENSITIVE Sensitive     CIPROFLOXACIN <=0.25 SENSITIVE Sensitive     GENTAMICIN <=1 SENSITIVE Sensitive     IMIPENEM <=0.25 SENSITIVE Sensitive     NITROFURANTOIN <=16 SENSITIVE Sensitive     TRIMETH/SULFA >=320 RESISTANT Resistant     PIP/TAZO <=4 SENSITIVE Sensitive     * >=100,000 COLONIES/mL CITROBACTER FREUNDII   Vancomycin resistant enterococcus isolated - MIC*    AMPICILLIN <=2 SENSITIVE Sensitive     LEVOFLOXACIN >=8 RESISTANT Resistant     NITROFURANTOIN <=16 SENSITIVE Sensitive     VANCOMYCIN >=32 RESISTANT Resistant     * 50,000 COLONIES/mL VANCOMYCIN RESISTANT ENTEROCOCCUS ISOLATED  Culture, blood (routine x 2)     Status: None (Preliminary result)   Collection Time: 11/11/18 11:19 AM   Specimen: Right Antecubital; Blood  Result Value Ref Range Status   Specimen Description   Final    RIGHT ANTECUBITAL Performed at [facility] [address], [place], [place] [address]    Special Requests   Final    BOTTLES  DRAWN AEROBIC AND ANAEROBIC Blood Culture adequate volume Performed at [facility] [address] [place], [place] [address]    Culture   Final    NO GROWTH 2 DAYS Performed at [facility] [address], [place], [place] [address]    Report Status PENDING  Incomplete  Culture, blood (routine x 2)     Status: None (Preliminary result)   Collection Time: 11/11/18 11:27 AM   Specimen: BLOOD RIGHT HAND  Result Value Ref Range Status   Specimen Description   Final    BLOOD RIGHT HAND Performed at [facility] [address], [place], [place] [address]    Special Requests   Final    BOTTLES DRAWN AEROBIC AND ANAEROBIC Blood Culture adequate volume Performed at [facility] [address] [place], [place] [address]    Culture   Final    NO GROWTH 2 DAYS Performed at [facility] [address], [place],  [address]    Report Status PENDING  Incomplete     Labs: BNP (last 3 results) No results for input(s): BNP in the last 8760 hours. Basic Metabolic Panel: Recent Labs  Lab 11/10/18 0315 11/11/18 0856 11/12/18 0312 11/13/18 0241  NA 137 137 140 140  K 5.2* 4.4 4.0 4.0  CL 110 113* 116* 111  CO2 13* 15* 16* 23  GLUCOSE 97 116* 108* 107*  BUN 68* 62* 45* 40*  CREATININE 2.07* 1.46* 1.16* 0.91  CALCIUM 9.2 8.9 8.7* 8.5*  MG  --  2.4 1.9 1.8  PHOS  --  3.7 3.1 1.9*   Liver Function Tests: Recent Labs  Lab 11/11/18 0856 11/12/18 0312 11/13/18 0241  AST 13* 14* 16  ALT 11 12 13   ALKPHOS 131* 131* 124  BILITOT 0.3 0.3 0.2*  PROT 6.7 5.9* 5.5*  ALBUMIN 3.5 3.1* 3.0*   No results for input(s): LIPASE, AMYLASE in the last 168 hours. No results for input(s): AMMONIA in the last 168 hours. CBC: Recent Labs  Lab 11/10/18 0315 11/10/18 1232 11/10/18 1735 11/11/18 0856 11/12/18 0312 11/13/18 0241  WBC 12.2*  --   --  7.3 7.7 7.5  NEUTROABS  --   --   --  5.0 5.3 4.4  HGB 11.2* 10.7*  10.7* 10.5* 9.7*  9.2*  HCT 37.9 36.2 35.3* 35.8* 32.3* 30.5*  MCV 106.5*  --   --  105.6* 103.2* 103.0*  PLT 350  --   --  298 292 282   Cardiac Enzymes: No results for input(s): CKTOTAL, CKMB, CKMBINDEX, TROPONINI in the last 168 hours. BNP: Invalid input(s): POCBNP CBG: No results for input(s): GLUCAP in the last 168 hours. D-Dimer No results for input(s): DDIMER in the last 72 hours. Hgb A1c No results for input(s): HGBA1C in the last 72 hours. Lipid Profile No results for input(s): CHOL, HDL, LDLCALC, TRIG, CHOLHDL, LDLDIRECT in the last 72 hours. Thyroid function studies No results for input(s): TSH, T4TOTAL, T3FREE, THYROIDAB in the last 72 hours.  Invalid input(s): FREET3 Anemia work up Recent Labs    11/10/18 1232  VITAMINB12 551  FOLATE 13.0  FERRITIN 19  TIBC 383  IRON 22*  RETICCTPCT 2.8   Urinalysis    Component Value Date/Time   COLORURINE RED (A) 11/10/2018 0150   APPEARANCEUR TURBID (A) 11/10/2018 0150   LABSPEC 1.015 11/10/2018 0150   PHURINE 6.0 11/10/2018 0150   GLUCOSEU NEGATIVE 11/10/2018 0150   HGBUR LARGE (A) 11/10/2018 0150   BILIRUBINUR NEGATIVE 11/10/2018 0150   KETONESUR NEGATIVE 11/10/2018 0150   PROTEINUR 100 (A) 11/10/2018 0150   UROBILINOGEN 0.2 03/20/2013 1515   NITRITE NEGATIVE 11/10/2018 0150   LEUKOCYTESUR LARGE (A) 11/10/2018 0150   Sepsis Labs Invalid input(s): PROCALCITONIN,  WBC,  LACTICIDVEN Microbiology Recent Results (from the past 240 hour(s))  SARS Coronavirus 2 [facility] order, Performed in [facility] hospital lab) Nasopharyngeal Nasopharyngeal Swab     Status: None   Collection Time: 11/10/18  6:10 AM   Specimen: Nasopharyngeal Swab  Result Value Ref Range Status   SARS Coronavirus 2 NEGATIVE NEGATIVE Final    Comment: (NOTE) If result is NEGATIVE SARS-CoV-2 target nucleic acids are NOT DETECTED. The SARS-CoV-2 RNA is generally detectable in upper and lower  respiratory specimens during the acute phase of  infection. The lowest  concentration of SARS-CoV-2 viral copies this assay can detect is 250  copies / mL. A negative result does not preclude SARS-CoV-2 infection  and should not be used as the sole basis for treatment or other  patient management decisions.  A negative result may occur with  improper specimen collection / handling, submission of specimen other  than nasopharyngeal swab, presence of viral mutation(s) within the  areas targeted by this assay, and inadequate number of viral copies  (<250 copies / mL). A negative result must be combined with clinical  observations, patient history, and epidemiological information. If result is POSITIVE SARS-CoV-2 target nucleic acids are DETECTED. The SARS-CoV-2 RNA is generally detectable in upper and lower  respiratory specimens dur ing the acute phase of infection.  Positive  results are indicative of active infection with SARS-CoV-2.  Clinical  correlation with patient history and other diagnostic information is  necessary to determine patient infection status.  Positive results do  not rule out bacterial infection or co-infection with other viruses. If result is PRESUMPTIVE POSTIVE SARS-CoV-2 nucleic acids MAY BE PRESENT.   A presumptive positive result was obtained on the submitted specimen  and confirmed on repeat testing.  While 2019 novel coronavirus  (SARS-CoV-2) nucleic acids may be present in the submitted sample  additional confirmatory testing may be necessary for epidemiological  and / or clinical management purposes  to differentiate between  SARS-CoV-2 and other Sarbecovirus currently known to infect humans.  If clinically indicated additional  testing with an alternate test  methodology [phone]) is advised. The SARS-CoV-2 RNA is generally  detectable in upper and lower respiratory sp ecimens during the acute  phase of infection. The expected result is Negative. Fact Sheet for Patients:   StrictlyIdeas.no Fact Sheet for Healthcare Providers: BankingDealers.co.za This test is not yet approved or cleared by the [place] FDA and has been authorized for detection and/or diagnosis of SARS-CoV-2 by FDA under an Emergency Use Authorization (EUA).  This EUA will remain in effect (meaning this test can be used) for the duration of the COVID-19 declaration under Section 564(b)(1) of the Act, 21 U.S.C. section 360bbb-3(b)(1), unless the authorization is terminated or revoked sooner. Performed at [facility], [place] [address], [place], [place] [address]   Culture, Urine     Status: Abnormal   Collection Time: 11/10/18  8:21 AM   Specimen: Urine, Random  Result Value Ref Range Status   Specimen Description URINE, RANDOM  Final   Special Requests NONE  Final   Culture (A)  Final    >=100,000 COLONIES/mL CITROBACTER FREUNDII 50,000 COLONIES/mL VANCOMYCIN RESISTANT ENTEROCOCCUS ISOLATED    Report Status 11/13/2018 FINAL  Final   Organism ID, Bacteria CITROBACTER FREUNDII (A)  Final   Organism ID, Bacteria VANCOMYCIN RESISTANT ENTEROCOCCUS ISOLATED (A)  Final      Susceptibility   Citrobacter freundii - MIC*    CEFAZOLIN >=64 RESISTANT Resistant     CEFTRIAXONE <=1 SENSITIVE Sensitive     CIPROFLOXACIN <=0.25 SENSITIVE Sensitive     GENTAMICIN <=1 SENSITIVE Sensitive     IMIPENEM <=0.25 SENSITIVE Sensitive     NITROFURANTOIN <=16 SENSITIVE Sensitive     TRIMETH/SULFA >=320 RESISTANT Resistant     PIP/TAZO <=4 SENSITIVE Sensitive     * >=100,000 COLONIES/mL CITROBACTER FREUNDII   Vancomycin resistant enterococcus isolated - MIC*    AMPICILLIN <=2 SENSITIVE Sensitive     LEVOFLOXACIN >=8 RESISTANT Resistant     NITROFURANTOIN <=16 SENSITIVE Sensitive     VANCOMYCIN >=32 RESISTANT Resistant     * 50,000 COLONIES/mL VANCOMYCIN RESISTANT ENTEROCOCCUS ISOLATED  Culture, blood (routine x 2)     Status: None  (Preliminary result)   Collection Time: 11/11/18 11:19 AM   Specimen: Right Antecubital; Blood  Result Value Ref Range Status   Specimen Description   Final    RIGHT ANTECUBITAL Performed at [facility], [place] [address], [place], [place] [address]    Special Requests   Final    BOTTLES DRAWN AEROBIC AND ANAEROBIC Blood Culture adequate volume Performed at [facility] [address] [place], [place] [address]    Culture   Final    NO GROWTH 2 DAYS Performed at [facility] [address] [place], [place] [address]    Report Status PENDING  Incomplete  Culture, blood (routine x 2)     Status: None (Preliminary result)   Collection Time: 11/11/18 11:27 AM   Specimen: BLOOD RIGHT HAND  Result Value Ref Range Status   Specimen Description   Final    BLOOD RIGHT HAND Performed at [facility] [address] [place], [place] [address]    Special Requests   Final    BOTTLES DRAWN AEROBIC AND ANAEROBIC Blood Culture adequate volume Performed at [facility] [address] [place],  [address]    Culture   Final    NO GROWTH 2 DAYS Performed at [facility]  [address], [place], [place] [address]    Report Status PENDING  Incomplete     Time coordinating discharge: Over 30 minutes  SIGNED:   Desiree Hane, MD  [facility] 11/13/2018, 10:32 AM Pager   If 7PM-7AM, please contact night-coverage [email or web address] Password [email or web address]

## 2018-11-13 NOTE — Patient Outreach (Signed)
  [facility]) Care Management  11/13/2018  Jaclyn Hart [birth year] [id number]   Transition of Care Referral   Referral Date: 11/13/18 Referral Source: Netta C[place]ars, [facility] hospital liaison  Post hospital follow up             Date of Admission: 11/10/18 Diagnosis: CAD, AKI Date of Discharge: 11/13/18  Facility: [facility]: [employer] medicare    Outreach attempt # 1 No answer. [facility] RN CM left HIPAA compliant voicemail message along with CM's contact info.   Plan: [facility] RN CM sent an unsuccessful outreach letter and scheduled this patient for another call attempt within 4 business days   Kym Scannell L. Lavina Hamman, RN, BSN, [facility] Management Care Coordinator Direct Number [phone] Mobile number [phone]  Main [facility] number [phone] Fax number [phone]

## 2018-11-13 NOTE — Care Management Important Message (Signed)
Important Message  Patient Details IM Letter given to Kathrin Greathouse SW to present to the Patient Name: PATRICIA PRUSHA MRN: [id number] Date of Birth: [birth year]   Medicare Important Message Given:  Yes     Kerin Salen 11/13/2018, 11:23 AM

## 2018-11-14 ENCOUNTER — Encounter: Payer: Self-pay | Admitting: *Deleted

## 2018-11-14 ENCOUNTER — Other Ambulatory Visit: Payer: Self-pay

## 2018-11-14 ENCOUNTER — Other Ambulatory Visit: Payer: Self-pay | Admitting: *Deleted

## 2018-11-14 NOTE — Patient Outreach (Signed)
[facility]) Care Management  11/14/2018  Jaclyn Hart 1950-11-14 [id number]   Transition of Care Referral  Referral Date: 11/13/18 Referral Source: Netta Cedars, [facility] hospital liaison Post hospital follow up Date of Admission: 11/10/18 Diagnosis: CAD, AKI Date of Discharge: 11/13/18  Facility: [place]: united healthcare medicare  [place] visits x 2 leading to admissions (10/26/18 [facility] & [facility]/10/18 [facility] both for gross hematuria) x 2 in the last 6 months    Outreach attempt # 2 No answer at home number [phone]. THN RN CM left HIPAA compliant voicemail message along with CM's contact info.  No answer at [phone] A female reports Cm had the incorrect number when dialed [phone] CM spoke with her daughter Jaclyn Hart when dialed [phone] for Mrs Kent Stuller reports pt with memory concerns, HOH and difficulty getting to her home phone Jaclyn Hart is able to complete HIPAA, Name address Reviewed and addressed Transition of care referral to [facility] with Jaclyn Hart offered [facility] CM number and the 24 hour nurse call center number  Transition of care assessment completed  Jaclyn Hart to call today 11/14/18 to get and appointment with primary care MD Jaclyn Hart reports they are pending contact [facility]      Social: Mrs Polhamus is a 68 year old retired, widow patient who lives alone and her daughter comes to check on her periodically Jaclyn Hart reports she completes most of Mrs Baller's I ADLs, like picking up medicines, paying bills, making doctor appointments Jaclyn Hart reports she had that she had the hospital discharging nurse to go over the discharge instructions with her as " some time she forgets things" Jaclyn Hart to call today 11/14/18 to get and appointment with primary care MD   Conditions: ross hematuria. CAD, AKI, CVA,  s/p left knee replacement 03/12/18, unilateral primary osteoarthritis, 05/26/17 nondisplaced fx, 04/03/17 fx lower  end of radius, 12/19=9/18 wedge compression fx second lumbar, hx of chest pain,  Internal hemorrhoids, GERD, UTI   DME: walker BP cuff  Medications: Jaclyn Hart reports she picked up new medications Aware pt to stop 2 HTN meds r/t her Kidneys  She denies concerns with taking medications as prescribed, affording medications, side effects of medications and questions about medications   Appointments: Jaclyn Hart to make an appointment with Dr Sherrie Sport for next week   Advance Directives: No Advance directives but Jaclyn Hart has the forms from the MD office. Not completed forms THN RN CM offered [facility] SW services to assist prn Jaclyn Hart will call if assist needed  Consent: [facility] RN CM reviewed [facility] services with Jaclyn Hart. Jaclyn Hart gave verbal consent for [facility] telephonic RN CM services.  Plan: [facility] RN CM scheduled this patient for a follow up transition of care call attempt within 4 business days [facility] RN CM sent an unsuccessful outreach letter on 11/13/18 This letter was discussed with Jaclyn Hart, Daughter to make her aware of contact information and [facility] pamphlet for review Routed note to MDs   [name], RN, BSN, [place] Management Care Coordinator Direct Number [phone] Mobile number [phone]  Main THN number [phone] Fax number [phone]

## 2018-11-16 LAB — CULTURE, BLOOD (ROUTINE X 2)
Culture: NO GROWTH
Culture: NO GROWTH
Special Requests: ADEQUATE
Special Requests: ADEQUATE

## 2018-11-17 DIAGNOSIS — Z8781 Personal history of (healed) traumatic fracture: Secondary | ICD-10-CM | POA: Diagnosis not present

## 2018-11-17 DIAGNOSIS — E872 Acidosis: Secondary | ICD-10-CM | POA: Diagnosis not present

## 2018-11-17 DIAGNOSIS — L89893 Pressure ulcer of other site, stage 3: Secondary | ICD-10-CM | POA: Diagnosis not present

## 2018-11-17 DIAGNOSIS — K219 Gastro-esophageal reflux disease without esophagitis: Secondary | ICD-10-CM | POA: Diagnosis not present

## 2018-11-17 DIAGNOSIS — Z9181 History of falling: Secondary | ICD-10-CM | POA: Diagnosis not present

## 2018-11-17 DIAGNOSIS — E440 Moderate protein-calorie malnutrition: Secondary | ICD-10-CM | POA: Diagnosis not present

## 2018-11-17 DIAGNOSIS — K648 Other hemorrhoids: Secondary | ICD-10-CM | POA: Diagnosis not present

## 2018-11-17 DIAGNOSIS — I10 Essential (primary) hypertension: Secondary | ICD-10-CM | POA: Diagnosis not present

## 2018-11-17 DIAGNOSIS — Z96641 Presence of right artificial hip joint: Secondary | ICD-10-CM | POA: Diagnosis not present

## 2018-11-17 DIAGNOSIS — I700 Atherosclerosis of aorta: Secondary | ICD-10-CM | POA: Diagnosis not present

## 2018-11-18 DIAGNOSIS — M6281 Muscle weakness (generalized): Secondary | ICD-10-CM | POA: Diagnosis not present

## 2018-11-19 ENCOUNTER — Other Ambulatory Visit: Payer: Self-pay

## 2018-11-19 ENCOUNTER — Other Ambulatory Visit: Payer: Self-pay | Admitting: *Deleted

## 2018-11-19 NOTE — Patient Outreach (Signed)
[facility]) Care Management  11/19/2018  Jaclyn Hart 14-May-1950 [id number]   Transition of Care Referral week 2 follow up  Referral Date:11/13/18 Referral Source:Netta Cedars, [facility] hospital liaisonPost hospital follow up Date of Admission:11/10/18 Diagnosis:CAD, AKI Date of Discharge:11/13/18 Facility:[facility] Insurance:united healthcare medicare [place] visits x 2 leading to admissions ([facility]/26/18 [facility] & [facility] both for gross hematuria) x 2 in the last 6 months    Outreach attempt # 3 Successful outreach to patient at [phone].  Patient is able to verify HIPAA, DOB and Address Reviewed and addressed Transition of care referral to [facility] with patient Discuss the call to her daughter Jaclyn Hart on last week on 11/14/18  The pt informs CM she does not call daughter during her work hours and is cautious about speaking with people on the telephone. She denies being [facility] but speaks in an increased louder tone of voice She also speaks at a fast pace   Consent: [facility] RN CM reviewed [facility] services with patient. Patient gave verbal consent for [facility] telephonic RN CM services.   Transition of care assessment completed with Jaclyn Hart  Jaclyn Hart confirms she did get a f/u appointment with primary care MD Jaclyn Luckenbill confirms [facility] health [facility] have begun [facility] RN CM discussed the differences in home health and [facility] Pt voiced understanding   Ebro RN CM discussed [place] services, [facility] RN CM follow up after her [facility] d/c She was given the 24 hour nurse call center number along with [facility] RN CM's number again.   Jaclyn Hart reports [facility] RN CM is not able to assist her with any medical needs today 11/19/18    Jaclyn Hart reports she maintains her low sodium heart healthy diet She states she [place]  Social: Jaclyn Hart is a 68 year old retired, widow patient who lives alone and her  daughter comes to check on her periodically Jaclyn Hart reports she completes most of Jaclyn Hart's I ADLs, like picking up medicines, paying bills, making doctor appointments Jaclyn Hart reports she had that she had the hospital discharging nurse to go over the discharge instructions with her as " some time she forgets things" Jaclyn Hart on 11/19/18 reports she is independent in her ADLs Jaclyn Hart report she is on a fixed income  Jaclyn Hart denies depression but states she has had hx of anxiety   Conditions: gross hematuria. CAD, AKI, CVA,  s/p left knee replacement 03/12/18, unilateral primary osteoarthritis, 05/26/17 nondisplaced fx, 04/03/17 fx lower end of radius, 02/14/17 wedge compression fx second lumbar, hx of chest pain,  Internal hemorrhoids, GERD, UTI   DME: walker BP cuff  Medications: Jaclyn Hart reports she picked up new medications Aware pt to stop 2 HTN meds r/t her Kidneys  She denies concerns with taking medications as prescribed, affording medications, side effects of medications and questions about medications   Appointments: Dr Sherrie Sport 11/20/18  Advance Directives: No Advance directives but Jaclyn Hart has the forms from the MD office. Not completed forms THN RN CM offered [facility] SW services to assist prn Jaclyn Hart will call if assist needed  Plan: [facility] RN CM scheduled this patient for a follow up call attempt within 4 business days [facility] RN CM sent an unsuccessful outreach letter on 11/13/18 This letter was discussed with Jaclyn Hoecker to make her aware of contact information and [facility] pamphlet for review  Routed note to MDs  Paddy Walthall L. Lavina Hamman, RN, BSN, [place] Management Care Coordinator Direct Number [phone] Mobile number [phone]  Main THN number [phone] Fax number [phone]

## 2018-11-20 DIAGNOSIS — R310 Gross hematuria: Secondary | ICD-10-CM | POA: Diagnosis not present

## 2018-11-20 DIAGNOSIS — N182 Chronic kidney disease, stage 2 (mild): Secondary | ICD-10-CM | POA: Diagnosis not present

## 2018-11-20 DIAGNOSIS — Z0000 Encounter for general adult medical examination without abnormal findings: Secondary | ICD-10-CM | POA: Diagnosis not present

## 2018-11-21 DIAGNOSIS — K219 Gastro-esophageal reflux disease without esophagitis: Secondary | ICD-10-CM | POA: Diagnosis not present

## 2018-11-21 DIAGNOSIS — Z9181 History of falling: Secondary | ICD-10-CM | POA: Diagnosis not present

## 2018-11-21 DIAGNOSIS — I10 Essential (primary) hypertension: Secondary | ICD-10-CM | POA: Diagnosis not present

## 2018-11-21 DIAGNOSIS — Z8781 Personal history of (healed) traumatic fracture: Secondary | ICD-10-CM | POA: Diagnosis not present

## 2018-11-21 DIAGNOSIS — K648 Other hemorrhoids: Secondary | ICD-10-CM | POA: Diagnosis not present

## 2018-11-21 DIAGNOSIS — L89893 Pressure ulcer of other site, stage 3: Secondary | ICD-10-CM | POA: Diagnosis not present

## 2018-11-21 DIAGNOSIS — E872 Acidosis: Secondary | ICD-10-CM | POA: Diagnosis not present

## 2018-11-21 DIAGNOSIS — I700 Atherosclerosis of aorta: Secondary | ICD-10-CM | POA: Diagnosis not present

## 2018-11-21 DIAGNOSIS — Z96641 Presence of right artificial hip joint: Secondary | ICD-10-CM | POA: Diagnosis not present

## 2018-11-21 DIAGNOSIS — E440 Moderate protein-calorie malnutrition: Secondary | ICD-10-CM | POA: Diagnosis not present

## 2018-11-26 ENCOUNTER — Encounter: Payer: Self-pay | Admitting: *Deleted

## 2018-11-26 ENCOUNTER — Other Ambulatory Visit: Payer: Self-pay

## 2018-11-26 ENCOUNTER — Other Ambulatory Visit: Payer: Self-pay | Admitting: *Deleted
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
## 2018-11-28 DIAGNOSIS — G4733 Obstructive sleep apnea (adult) (pediatric): Secondary | ICD-10-CM | POA: Diagnosis not present

## 2018-11-28 DIAGNOSIS — E440 Moderate protein-calorie malnutrition: Secondary | ICD-10-CM | POA: Diagnosis not present

## 2018-11-28 DIAGNOSIS — K648 Other hemorrhoids: Secondary | ICD-10-CM | POA: Diagnosis not present

## 2018-11-28 DIAGNOSIS — Z8781 Personal history of (healed) traumatic fracture: Secondary | ICD-10-CM | POA: Diagnosis not present

## 2018-11-28 DIAGNOSIS — M6282 Rhabdomyolysis: Secondary | ICD-10-CM | POA: Diagnosis not present

## 2018-11-28 DIAGNOSIS — E782 Mixed hyperlipidemia: Secondary | ICD-10-CM | POA: Diagnosis not present

## 2018-11-28 DIAGNOSIS — E785 Hyperlipidemia, unspecified: Secondary | ICD-10-CM | POA: Diagnosis not present

## 2018-11-28 DIAGNOSIS — D649 Anemia, unspecified: Secondary | ICD-10-CM | POA: Diagnosis not present

## 2018-11-28 DIAGNOSIS — I10 Essential (primary) hypertension: Secondary | ICD-10-CM | POA: Diagnosis not present

## 2018-11-28 DIAGNOSIS — M1712 Unilateral primary osteoarthritis, left knee: Secondary | ICD-10-CM | POA: Diagnosis not present

## 2018-11-28 DIAGNOSIS — M19012 Primary osteoarthritis, left shoulder: Secondary | ICD-10-CM | POA: Diagnosis not present

## 2018-11-28 DIAGNOSIS — Z87440 Personal history of urinary (tract) infections: Secondary | ICD-10-CM | POA: Diagnosis not present

## 2018-11-28 DIAGNOSIS — Z9181 History of falling: Secondary | ICD-10-CM | POA: Diagnosis not present

## 2018-11-28 DIAGNOSIS — Z8673 Personal history of transient ischemic attack (TIA), and cerebral infarction without residual deficits: Secondary | ICD-10-CM | POA: Diagnosis not present

## 2018-11-28 DIAGNOSIS — Z7982 Long term (current) use of aspirin: Secondary | ICD-10-CM | POA: Diagnosis not present

## 2018-11-28 DIAGNOSIS — G43909 Migraine, unspecified, not intractable, without status migrainosus: Secondary | ICD-10-CM | POA: Diagnosis not present

## 2018-11-28 DIAGNOSIS — N182 Chronic kidney disease, stage 2 (mild): Secondary | ICD-10-CM | POA: Diagnosis not present

## 2018-11-28 DIAGNOSIS — K219 Gastro-esophageal reflux disease without esophagitis: Secondary | ICD-10-CM | POA: Diagnosis not present

## 2018-11-28 DIAGNOSIS — Z96641 Presence of right artificial hip joint: Secondary | ICD-10-CM | POA: Diagnosis not present

## 2018-11-28 DIAGNOSIS — I700 Atherosclerosis of aorta: Secondary | ICD-10-CM | POA: Diagnosis not present

## 2018-11-30 DIAGNOSIS — Z87440 Personal history of urinary (tract) infections: Secondary | ICD-10-CM | POA: Diagnosis not present

## 2018-11-30 DIAGNOSIS — K648 Other hemorrhoids: Secondary | ICD-10-CM | POA: Diagnosis not present

## 2018-11-30 DIAGNOSIS — Z8781 Personal history of (healed) traumatic fracture: Secondary | ICD-10-CM | POA: Diagnosis not present

## 2018-11-30 DIAGNOSIS — G43909 Migraine, unspecified, not intractable, without status migrainosus: Secondary | ICD-10-CM | POA: Diagnosis not present

## 2018-11-30 DIAGNOSIS — Z7982 Long term (current) use of aspirin: Secondary | ICD-10-CM | POA: Diagnosis not present

## 2018-11-30 DIAGNOSIS — M1712 Unilateral primary osteoarthritis, left knee: Secondary | ICD-10-CM | POA: Diagnosis not present

## 2018-11-30 DIAGNOSIS — I700 Atherosclerosis of aorta: Secondary | ICD-10-CM | POA: Diagnosis not present

## 2018-11-30 DIAGNOSIS — K219 Gastro-esophageal reflux disease without esophagitis: Secondary | ICD-10-CM | POA: Diagnosis not present

## 2018-11-30 DIAGNOSIS — G4733 Obstructive sleep apnea (adult) (pediatric): Secondary | ICD-10-CM | POA: Diagnosis not present

## 2018-11-30 DIAGNOSIS — Z9181 History of falling: Secondary | ICD-10-CM | POA: Diagnosis not present

## 2018-11-30 DIAGNOSIS — Z8673 Personal history of transient ischemic attack (TIA), and cerebral infarction without residual deficits: Secondary | ICD-10-CM | POA: Diagnosis not present

## 2018-11-30 DIAGNOSIS — E785 Hyperlipidemia, unspecified: Secondary | ICD-10-CM | POA: Diagnosis not present

## 2018-11-30 DIAGNOSIS — E440 Moderate protein-calorie malnutrition: Secondary | ICD-10-CM | POA: Diagnosis not present

## 2018-11-30 DIAGNOSIS — M19012 Primary osteoarthritis, left shoulder: Secondary | ICD-10-CM | POA: Diagnosis not present

## 2018-11-30 DIAGNOSIS — M6282 Rhabdomyolysis: Secondary | ICD-10-CM | POA: Diagnosis not present

## 2018-11-30 DIAGNOSIS — I10 Essential (primary) hypertension: Secondary | ICD-10-CM | POA: Diagnosis not present

## 2018-11-30 DIAGNOSIS — Z96641 Presence of right artificial hip joint: Secondary | ICD-10-CM | POA: Diagnosis not present

## 2018-11-30 DIAGNOSIS — D649 Anemia, unspecified: Secondary | ICD-10-CM | POA: Diagnosis not present

## 2018-12-02 DIAGNOSIS — K219 Gastro-esophageal reflux disease without esophagitis: Secondary | ICD-10-CM | POA: Diagnosis not present

## 2018-12-02 DIAGNOSIS — Z8781 Personal history of (healed) traumatic fracture: Secondary | ICD-10-CM | POA: Diagnosis not present

## 2018-12-02 DIAGNOSIS — M1712 Unilateral primary osteoarthritis, left knee: Secondary | ICD-10-CM | POA: Diagnosis not present

## 2018-12-02 DIAGNOSIS — E785 Hyperlipidemia, unspecified: Secondary | ICD-10-CM | POA: Diagnosis not present

## 2018-12-02 DIAGNOSIS — Z96641 Presence of right artificial hip joint: Secondary | ICD-10-CM | POA: Diagnosis not present

## 2018-12-02 DIAGNOSIS — I700 Atherosclerosis of aorta: Secondary | ICD-10-CM | POA: Diagnosis not present

## 2018-12-02 DIAGNOSIS — G4733 Obstructive sleep apnea (adult) (pediatric): Secondary | ICD-10-CM | POA: Diagnosis not present

## 2018-12-02 DIAGNOSIS — E440 Moderate protein-calorie malnutrition: Secondary | ICD-10-CM | POA: Diagnosis not present

## 2018-12-02 DIAGNOSIS — K648 Other hemorrhoids: Secondary | ICD-10-CM | POA: Diagnosis not present

## 2018-12-02 DIAGNOSIS — M19012 Primary osteoarthritis, left shoulder: Secondary | ICD-10-CM | POA: Diagnosis not present

## 2018-12-02 DIAGNOSIS — M6282 Rhabdomyolysis: Secondary | ICD-10-CM | POA: Diagnosis not present

## 2018-12-02 DIAGNOSIS — Z87440 Personal history of urinary (tract) infections: Secondary | ICD-10-CM | POA: Diagnosis not present

## 2018-12-02 DIAGNOSIS — Z8673 Personal history of transient ischemic attack (TIA), and cerebral infarction without residual deficits: Secondary | ICD-10-CM | POA: Diagnosis not present

## 2018-12-02 DIAGNOSIS — Z9181 History of falling: Secondary | ICD-10-CM | POA: Diagnosis not present

## 2018-12-02 DIAGNOSIS — G43909 Migraine, unspecified, not intractable, without status migrainosus: Secondary | ICD-10-CM | POA: Diagnosis not present

## 2018-12-02 DIAGNOSIS — Z7982 Long term (current) use of aspirin: Secondary | ICD-10-CM | POA: Diagnosis not present

## 2018-12-02 DIAGNOSIS — D649 Anemia, unspecified: Secondary | ICD-10-CM | POA: Diagnosis not present

## 2018-12-02 DIAGNOSIS — I10 Essential (primary) hypertension: Secondary | ICD-10-CM | POA: Diagnosis not present

## 2018-12-04 ENCOUNTER — Other Ambulatory Visit: Payer: Self-pay | Admitting: *Deleted

## 2018-12-04 NOTE — Patient Outreach (Signed)
[facility]) Care Management  12/04/2018  LAQUIDA STROUP Aug 05, 1950 [id number]     Transition of Care Referralweek 4 follow up  Referral Date:11/13/18 Referral Source:Netta Cedars, [facility] hospital liaisonPost hospital follow up Date of Admission:11/10/18 Diagnosis:CAD, AKI Date of Discharge:11/13/18 Facility:[facility] Insurance:united healthcare medicare [place] visits x 2 leading to admissions ([facility]/18 [facility] & [facility]/10/18 [facility] both for gross hematuria) x 2 in the last 6 months  unsuccessful outreach to patient [phone].  No answer at home number [phone]. THN RN CM left HIPAA compliant voicemail message along with CM's contact info   Mrs Lenser has not been to ED or admitted in the last 21 days   Plan: [facility] RN CM scheduled this patient for another call attempt within 4-7  business days   Trustin Chapa L. Lavina Hamman, RN, BSN, [place] Management Care Coordinator Direct Number [phone] Mobile number [phone]  Main THN number [phone] Fax number [phone]

## 2018-12-05 DIAGNOSIS — Z8673 Personal history of transient ischemic attack (TIA), and cerebral infarction without residual deficits: Secondary | ICD-10-CM | POA: Diagnosis not present

## 2018-12-05 DIAGNOSIS — Z96641 Presence of right artificial hip joint: Secondary | ICD-10-CM | POA: Diagnosis not present

## 2018-12-05 DIAGNOSIS — Z7982 Long term (current) use of aspirin: Secondary | ICD-10-CM | POA: Diagnosis not present

## 2018-12-05 DIAGNOSIS — I10 Essential (primary) hypertension: Secondary | ICD-10-CM | POA: Diagnosis not present

## 2018-12-05 DIAGNOSIS — G4733 Obstructive sleep apnea (adult) (pediatric): Secondary | ICD-10-CM | POA: Diagnosis not present

## 2018-12-05 DIAGNOSIS — Z9181 History of falling: Secondary | ICD-10-CM | POA: Diagnosis not present

## 2018-12-05 DIAGNOSIS — M19012 Primary osteoarthritis, left shoulder: Secondary | ICD-10-CM | POA: Diagnosis not present

## 2018-12-05 DIAGNOSIS — D649 Anemia, unspecified: Secondary | ICD-10-CM | POA: Diagnosis not present

## 2018-12-05 DIAGNOSIS — K648 Other hemorrhoids: Secondary | ICD-10-CM | POA: Diagnosis not present

## 2018-12-05 DIAGNOSIS — E440 Moderate protein-calorie malnutrition: Secondary | ICD-10-CM | POA: Diagnosis not present

## 2018-12-05 DIAGNOSIS — M6282 Rhabdomyolysis: Secondary | ICD-10-CM | POA: Diagnosis not present

## 2018-12-05 DIAGNOSIS — K219 Gastro-esophageal reflux disease without esophagitis: Secondary | ICD-10-CM | POA: Diagnosis not present

## 2018-12-05 DIAGNOSIS — I700 Atherosclerosis of aorta: Secondary | ICD-10-CM | POA: Diagnosis not present

## 2018-12-05 DIAGNOSIS — M1712 Unilateral primary osteoarthritis, left knee: Secondary | ICD-10-CM | POA: Diagnosis not present

## 2018-12-05 DIAGNOSIS — Z8781 Personal history of (healed) traumatic fracture: Secondary | ICD-10-CM | POA: Diagnosis not present

## 2018-12-05 DIAGNOSIS — G43909 Migraine, unspecified, not intractable, without status migrainosus: Secondary | ICD-10-CM | POA: Diagnosis not present

## 2018-12-05 DIAGNOSIS — Z87440 Personal history of urinary (tract) infections: Secondary | ICD-10-CM | POA: Diagnosis not present

## 2018-12-05 DIAGNOSIS — E785 Hyperlipidemia, unspecified: Secondary | ICD-10-CM | POA: Diagnosis not present

## 2018-12-06 ENCOUNTER — Other Ambulatory Visit: Payer: Self-pay

## 2018-12-06 ENCOUNTER — Other Ambulatory Visit: Payer: Self-pay | Admitting: *Deleted

## 2018-12-06 NOTE — Patient Outreach (Signed)
[facility]) Care Management  12/06/2018  Jaclyn Hart 05/23/1950 [id number]    Short term care week 4 follow up  Referral Date:11/13/18 Referral Source:Netta Cedars, [facility] hospital liaisonPost hospital follow up Date of Admission:11/10/18 Diagnosis:CAD, AKI Date of Discharge:11/13/18 Facility:[facility] Insurance:united healthcare medicare [place] visits x 2 leading to admissions ([facility]/18 [facility] & [facility]18 [facility] both for gross hematuria) x 2 in the last 6 months   Jaclyn Hart has not been to ED or admitted in the last 23 days  Transition of care services noted to be completed by primary care MD office staff- Dr Stoney Bang, [facility] internal Jaclyn   Transition of Care will be further completed by primary care provider office who will refer to Jaclyn Hart care management if needed.  Successful outreach to patient [phone]. HIPAA verified (DOB, address)  THN RN CM reviewed the reason for the call to follow up on OTC benefit, transportation and further home management assessment   Jaclyn Hart confirms she is still receiving home health RN visits Jaclyn Hart) Jaclyn Hart is scheduled for a CT scan on 12/08/08 in [place] She is getting the CT related to increase urination without burning and varying output amounts. She reports she is not on a diuretic.    Conditions: Gross hematuria. CAD, AKI, CVA,  HTN, s/p left knee replacement 03/12/18, unilateral primary osteoarthritis, 05/26/17 nondisplaced fx, 04/03/17 fx lower end of radius, 12/19=9/18 wedge compression fx second lumbar, hx of chest pain,  Internal hemorrhoids, GERD, UTI   [place] healthcare medicare OTC benefit Jaclyn Hart and CM called [employer] to again confirm her OTC benefit has been changed by united healthcare. Referred to [place] healthcare customer service [facility] RN CM and Jaclyn Hart completed a conference call to [place] [employer] and spoke with  Jaclyn Hart who provided the new OTC benefit as [employer].  A conference call to [place] [phone]) was completed. Spoke with Jaclyn Hart to confirm Jaclyn Hart has a 50-credit limit and requested a catalog be sent to Jaclyn Hart via mail and via e-mail. Jaclyn Hart does not used the Internet but recognizes the e-mail address of her daughter as listed per [place]. She was informed a catalog was last sent to her on 04/29/18 Jaclyn Hart denies receiving it  Jaclyn Hart reports her daughter brings her a 3-day supply of her headache and nerve medicines to prevent inaccurate doses  Falls none  Flu shot Jaclyn Hart reports she is scheduled to get her flu shot next week  Transportation Jaclyn Hart is familiar with [facility] but not [facility] RN CM and Jaclyn Hart completed a conference call to [employer] and spoke with Jaclyn Hart to verify she is eligible for service and to have information on the services sent to her via mail  Socialization Jaclyn Hart reports loneliness THN RN CM offered united healthcare social program but Jaclyn Hart refused the need and reports she does not like to be out among others   HTN She took her BP to obtain a value of 135/95 She reports her MD informed her BP needs to be < 140 90 She retook her BP and obtained 134/86 She denies use of alcohol beverages  THN RN CM discussed hypotension and hypertension Jaclyn Hart report have some intermittent dizziness and a headache   Jaclyn Hart's [facility] [place] RN CM will follow up with Jaclyn Hart to see if she has  received any of her resources and to continue short term CM services  Pt encouraged to return a call to [facility] RN CM prn  MD involvement barriers letter sent  Routed note to MD   [place] Problem One     Most Recent Value  Care Plan Problem One  Risk for hospital readmission secondary to recently diagnosed CAD  Role Documenting the Problem  One  Care Management Telephonic Coordinator  Care Plan for Problem One  Active  THN Long Term Goal   Over the next 31 days, patient will not experience hospital readmission, as evidenced by patient reporting and review of EMR during [facility] RN CCM outreach  [place] Term Goal Start Date  11/14/18  Interventions for Problem One Long Term Goal  continue to assess and follow up with patient for medical needs   THN CM Short Term Goal #1   Over the next 7 days, patient will have a hospital follow up office visit appointments with her primary MD as evidenced by patient reporting during [facility] RN CM outreach  [facility] CM Short Term Goal #1 Start Date  11/14/18  [facility] CM Short Term Goal #1 Met Date  12/26/18    [facility] CM Care Plan Problem Two     Most Recent Value  Care Plan Problem Two  Knowledge of home care for HTN, CVA and CAD   Role Documenting the Problem Two  Care Management Telephonic Coordinator  Care Plan for Problem Two  Active  Interventions for Problem Two Long Term Goal   discussed DME needed to monitor HTN, discuss social resources, assist with connecting her to [employer] and [employer]  THN Long Term Goal  over the next 30 days pateint will be able to verbalize 2-3 interventions to manage her major medical conditions at home  [place] Term Goal Start Date  11/26/18  [facility] CM Short Term Goal #1   over the next 21 days patient will voice she has received resources in the mail during follow up call to her   [facility] CM Short Term Goal #1 Start Date  11/26/18  Interventions for Short Term Goal #2   discussed DME needed to monitor HTN, discuss social resources, assist with connecting her to [employer] and [place]. Lavina Hamman, RN, BSN, [place] Coordinator Office number [phone] Mobile number [phone]  Main THN number [phone] Fax number [phone]

## 2018-12-09 DIAGNOSIS — R310 Gross hematuria: Secondary | ICD-10-CM | POA: Diagnosis not present

## 2018-12-11 DIAGNOSIS — G4733 Obstructive sleep apnea (adult) (pediatric): Secondary | ICD-10-CM | POA: Diagnosis not present

## 2018-12-11 DIAGNOSIS — R350 Frequency of micturition: Secondary | ICD-10-CM | POA: Diagnosis not present

## 2018-12-11 DIAGNOSIS — R310 Gross hematuria: Secondary | ICD-10-CM | POA: Diagnosis not present

## 2018-12-11 DIAGNOSIS — E785 Hyperlipidemia, unspecified: Secondary | ICD-10-CM | POA: Diagnosis not present

## 2018-12-11 DIAGNOSIS — E440 Moderate protein-calorie malnutrition: Secondary | ICD-10-CM | POA: Diagnosis not present

## 2018-12-11 DIAGNOSIS — Z96641 Presence of right artificial hip joint: Secondary | ICD-10-CM | POA: Diagnosis not present

## 2018-12-11 DIAGNOSIS — G43909 Migraine, unspecified, not intractable, without status migrainosus: Secondary | ICD-10-CM | POA: Diagnosis not present

## 2018-12-11 DIAGNOSIS — Z8673 Personal history of transient ischemic attack (TIA), and cerebral infarction without residual deficits: Secondary | ICD-10-CM | POA: Diagnosis not present

## 2018-12-11 DIAGNOSIS — Z8781 Personal history of (healed) traumatic fracture: Secondary | ICD-10-CM | POA: Diagnosis not present

## 2018-12-11 DIAGNOSIS — I700 Atherosclerosis of aorta: Secondary | ICD-10-CM | POA: Diagnosis not present

## 2018-12-11 DIAGNOSIS — M6282 Rhabdomyolysis: Secondary | ICD-10-CM | POA: Diagnosis not present

## 2018-12-11 DIAGNOSIS — Z7982 Long term (current) use of aspirin: Secondary | ICD-10-CM | POA: Diagnosis not present

## 2018-12-11 DIAGNOSIS — Z87440 Personal history of urinary (tract) infections: Secondary | ICD-10-CM | POA: Diagnosis not present

## 2018-12-11 DIAGNOSIS — M19012 Primary osteoarthritis, left shoulder: Secondary | ICD-10-CM | POA: Diagnosis not present

## 2018-12-11 DIAGNOSIS — D649 Anemia, unspecified: Secondary | ICD-10-CM | POA: Diagnosis not present

## 2018-12-11 DIAGNOSIS — K219 Gastro-esophageal reflux disease without esophagitis: Secondary | ICD-10-CM | POA: Diagnosis not present

## 2018-12-11 DIAGNOSIS — I10 Essential (primary) hypertension: Secondary | ICD-10-CM | POA: Diagnosis not present

## 2018-12-11 DIAGNOSIS — M1712 Unilateral primary osteoarthritis, left knee: Secondary | ICD-10-CM | POA: Diagnosis not present

## 2018-12-11 DIAGNOSIS — K648 Other hemorrhoids: Secondary | ICD-10-CM | POA: Diagnosis not present

## 2018-12-11 DIAGNOSIS — Z9181 History of falling: Secondary | ICD-10-CM | POA: Diagnosis not present

## 2018-12-13 ENCOUNTER — Ambulatory Visit: Payer: Self-pay | Admitting: *Deleted

## 2018-12-17 DIAGNOSIS — G43911 Migraine, unspecified, intractable, with status migrainosus: Secondary | ICD-10-CM | POA: Diagnosis not present

## 2018-12-17 DIAGNOSIS — N182 Chronic kidney disease, stage 2 (mild): Secondary | ICD-10-CM | POA: Diagnosis not present

## 2018-12-17 DIAGNOSIS — I10 Essential (primary) hypertension: Secondary | ICD-10-CM | POA: Diagnosis not present

## 2018-12-18 ENCOUNTER — Other Ambulatory Visit: Payer: Self-pay | Admitting: *Deleted

## 2018-12-18 DIAGNOSIS — I700 Atherosclerosis of aorta: Secondary | ICD-10-CM | POA: Diagnosis not present

## 2018-12-18 DIAGNOSIS — I10 Essential (primary) hypertension: Secondary | ICD-10-CM | POA: Diagnosis not present

## 2018-12-18 DIAGNOSIS — Z7982 Long term (current) use of aspirin: Secondary | ICD-10-CM | POA: Diagnosis not present

## 2018-12-18 DIAGNOSIS — K648 Other hemorrhoids: Secondary | ICD-10-CM | POA: Diagnosis not present

## 2018-12-18 DIAGNOSIS — K219 Gastro-esophageal reflux disease without esophagitis: Secondary | ICD-10-CM | POA: Diagnosis not present

## 2018-12-18 DIAGNOSIS — D649 Anemia, unspecified: Secondary | ICD-10-CM | POA: Diagnosis not present

## 2018-12-18 DIAGNOSIS — G4733 Obstructive sleep apnea (adult) (pediatric): Secondary | ICD-10-CM | POA: Diagnosis not present

## 2018-12-18 DIAGNOSIS — M19012 Primary osteoarthritis, left shoulder: Secondary | ICD-10-CM | POA: Diagnosis not present

## 2018-12-18 DIAGNOSIS — M6282 Rhabdomyolysis: Secondary | ICD-10-CM | POA: Diagnosis not present

## 2018-12-18 DIAGNOSIS — Z8781 Personal history of (healed) traumatic fracture: Secondary | ICD-10-CM | POA: Diagnosis not present

## 2018-12-18 DIAGNOSIS — E785 Hyperlipidemia, unspecified: Secondary | ICD-10-CM | POA: Diagnosis not present

## 2018-12-18 DIAGNOSIS — G43909 Migraine, unspecified, not intractable, without status migrainosus: Secondary | ICD-10-CM | POA: Diagnosis not present

## 2018-12-18 DIAGNOSIS — Z87440 Personal history of urinary (tract) infections: Secondary | ICD-10-CM | POA: Diagnosis not present

## 2018-12-18 DIAGNOSIS — Z8673 Personal history of transient ischemic attack (TIA), and cerebral infarction without residual deficits: Secondary | ICD-10-CM | POA: Diagnosis not present

## 2018-12-18 DIAGNOSIS — Z96641 Presence of right artificial hip joint: Secondary | ICD-10-CM | POA: Diagnosis not present

## 2018-12-18 DIAGNOSIS — E440 Moderate protein-calorie malnutrition: Secondary | ICD-10-CM | POA: Diagnosis not present

## 2018-12-18 DIAGNOSIS — Z9181 History of falling: Secondary | ICD-10-CM | POA: Diagnosis not present

## 2018-12-18 DIAGNOSIS — M1712 Unilateral primary osteoarthritis, left knee: Secondary | ICD-10-CM | POA: Diagnosis not present

## 2018-12-18 DIAGNOSIS — M6281 Muscle weakness (generalized): Secondary | ICD-10-CM | POA: Diagnosis not present

## 2018-12-18 NOTE — Patient Outreach (Signed)
  [name]  12/18/2018  EMELEE Hart [birth year] [id number]   Short term care week57follow up  Referral Date:11/13/18 Referral Source:Netta Cedars, [place] [facility] hospital liaisonPost hospital follow up Date of Admission:11/10/18 Diagnosis:CAD, AKI Date of Discharge:11/13/18 Facility:[facility] Insurance:united healthcare medicare [place] visits x 2 leading to admissions ([facility]18 [facility] & [facility]/10/18 [facility] both for gross hematuria) x 2 in the last 6 months  [name] has not been to ED or admitted in the last35days   unsuuccessful outreach to patient [phone].  No answer. THN RN CM unable to leave a HIPAA compliant voicemail message    Plan: [facility] RN CM scheduled this patient for another call attempt within 4 business days  [name] L. Lavina Hamman, RN, BSN, [place] Coordinator Office number [phone] Mobile number [phone]  Main THN number [phone] Fax number [phone]

## 2018-12-20 DIAGNOSIS — Z01818 Encounter for other preprocedural examination: Secondary | ICD-10-CM | POA: Diagnosis not present

## 2018-12-24 ENCOUNTER — Other Ambulatory Visit: Payer: Self-pay

## 2018-12-24 ENCOUNTER — Other Ambulatory Visit: Payer: Self-pay | Admitting: *Deleted

## 2018-12-24 DIAGNOSIS — K649 Unspecified hemorrhoids: Secondary | ICD-10-CM | POA: Diagnosis not present

## 2018-12-24 DIAGNOSIS — K51511 Left sided colitis with rectal bleeding: Secondary | ICD-10-CM | POA: Diagnosis not present

## 2018-12-24 DIAGNOSIS — K219 Gastro-esophageal reflux disease without esophagitis: Secondary | ICD-10-CM | POA: Diagnosis not present

## 2018-12-24 DIAGNOSIS — I10 Essential (primary) hypertension: Secondary | ICD-10-CM | POA: Diagnosis not present

## 2018-12-24 DIAGNOSIS — K635 Polyp of colon: Secondary | ICD-10-CM | POA: Diagnosis not present

## 2018-12-24 DIAGNOSIS — D125 Benign neoplasm of sigmoid colon: Secondary | ICD-10-CM | POA: Diagnosis not present

## 2018-12-24 DIAGNOSIS — Z955 Presence of coronary angioplasty implant and graft: Secondary | ICD-10-CM | POA: Diagnosis not present

## 2018-12-24 DIAGNOSIS — D128 Benign neoplasm of rectum: Secondary | ICD-10-CM | POA: Diagnosis not present

## 2018-12-24 DIAGNOSIS — J45909 Unspecified asthma, uncomplicated: Secondary | ICD-10-CM | POA: Diagnosis not present

## 2018-12-24 DIAGNOSIS — E7800 Pure hypercholesterolemia, unspecified: Secondary | ICD-10-CM | POA: Diagnosis not present

## 2018-12-24 DIAGNOSIS — Z886 Allergy status to analgesic agent status: Secondary | ICD-10-CM | POA: Diagnosis not present

## 2018-12-24 DIAGNOSIS — K625 Hemorrhage of anus and rectum: Secondary | ICD-10-CM | POA: Diagnosis not present

## 2018-12-24 NOTE — Patient Outreach (Signed)
[facility]) Care Management  12/24/2018  [name] Sep 08, 1950 [id number]   Short term careweek6 follow up  Referral Date:11/13/18 Referral Source:Netta Cedars, [facility] hospital liaisonPost hospital follow up Date of Admission:11/10/18 Diagnosis:CAD, AKI Date of Discharge:11/13/18 Facility:[facility] Insurance:united healthcare medicare [place] visits x 2 leading to admissions (10/26/18 [facility] & [facility]/10/18 [facility] both for gross hematuria) x 2 in the last 6 months  Jaclyn Hart has not been to ED or admitted in the last41days   2nd unsuccessful outreach to this engaged patient's home  [phone]. No answer. [facility] RN CM left a HIPAA compliant voicemail message   Outreach attempt to her daughter successful [facility] RN CM called and spoke with her daughter, Jaclyn Hart at [id number] Jaclyn Hart, CM spoke with her daughter Jaclyn Hart when dialed a number for Jaclyn Hart reports pt with memory concerns, HOH and difficulty getting to her home phone Jaclyn Hart is able to complete HIPAA, Name address   Jaclyn Hart confirms Jaclyn Hart is doing okay and she was able to get her colonoscopy with hemorrhoid banding  done today 12/24/18 after it was post pone a few times She is scheduled to have surgery on 01/07/19 with  Dr Rocco Serene -internal hemmorhoids   [facility] RN CM reviewed the [place] and the new OTC benefit as [place] to let Jaclyn Hart know [facility] RN CM was following up with her   Social:Jaclyn Hart is a 68 year old retired, widow patient wholives alone and her daughter comes to check on her periodicallyLisa reports she completes most of Jaclyn Hart's I ADLs, like picking up medicines, paying bills, making doctor appointments Jaclyn Hart reports she " some time she forgets things" Jaclyn Hart on 11/19/18 reports she is independent in her ADLs Jaclyn Hart report she is on a fixed income  Jaclyn Hart denies depression but states  she has had hx of anxiety   Conditions:gross hematuria. CAD, AKI, CVA, s/p left knee replacement 03/12/18, unilateral primary osteoarthritis, 05/26/17 nondisplaced fx, 04/03/17 fx lower end of radius, 02/14/17 wedge compression fx second lumbar, hx of chest pain, Internal hemorrhoids, GERD, UTI   [id number], BP cuff  Advance Directives:No Advance directives but Jaclyn Hart has the forms from the MD office. Not completed forms [facility] RN CM offered [facility] services to assist prn Jaclyn Hart will call if assist needed   Plan: [facility] RN CM scheduled this patient for another call attempt within 21 business days  Pt encouraged to return a call to [facility] RN CM prn    L. Lavina Hamman, RN, BSN, [place] Coordinator Office number [phone] Mobile number [phone]  Main [facility] number [phone] Fax number [phone]

## 2018-12-26 ENCOUNTER — Other Ambulatory Visit: Payer: Self-pay | Admitting: Cardiology

## 2018-12-31 ENCOUNTER — Other Ambulatory Visit: Payer: Self-pay | Admitting: *Deleted

## 2018-12-31 NOTE — Patient Outreach (Signed)
[facility]) Care Management  12/31/2018  ALEJANDRIA PLAGER 08-03-1950 [id number]   Short term careweek7 follow up  Referral Date:11/13/18 Referral Source:Netta Cedars, [facility] hospital liaisonPost hospital follow up Date of Admission:11/10/18 Diagnosis:CAD, AKI Date of Discharge:11/13/18 [facility] Insurance:united healthcare medicare [place] visits x 2 leading to admissions (10/26/18 [facility] & [facility]/10/18 [facility] both for gross hematuria) x 2 in the last 6 months  Mrs Doung has not been to ED or admitted in the last47days 3rd unsuccessful outreach to this engaged patient's home  [phone]. No answer. [facility] RN CMleft aHIPAA compliant voicemail message  Last outreach to her daughter [name] on 12/24/18 and pt had just had a colonoscopy   Plan: [facility] RN CM sent an unsuccessful outreach letter and scheduled this [facility] engaged patient for another call attempt within 7 business days   Teonia Yager L. Lavina Hamman, RN, BSN, [place] Coordinator Office number [phone] Mobile number [phone]  Main [facility] number [phone] Fax number [phone]

## 2019-01-07 ENCOUNTER — Other Ambulatory Visit: Payer: Self-pay | Admitting: *Deleted

## 2019-01-07 NOTE — Patient Outreach (Signed)
[facility]) Care Management  01/07/2019  Jaclyn Hart 07-28-1950 [id number]   Short term careweek73follow up  Referral Date:11/13/18 Referral Source:Netta Cedars, [facility] hospital liaisonPost hospital follow up Date of Admission:11/10/18 Diagnosis:CAD, AKI Date of Discharge:11/13/18 Facility:[facility] Insurance:united healthcare medicare [place] visits x 2 leading to admissions (10/26/18 [facility] & [facility]/10[facility]18 [facility] both for gross hematuria) x 2 in the last 6 months  [name] has not been to ED or admitted in the last54days 4th unsuccessfuloutreach to this engagedpatient's [id number]. No answer. [facility] RN CMleftaHIPAA compliant voicemail message Last outreach to her daughter [name] on 12/24/18 and pt had just had a colonoscopy   Plan:  [facility] RN CM scheduled this [facility] engaged patient for case closure within 5 business days   [facility] RN CM sent an unsuccessful outreach letter on 12/31/18   [name], RN, BSN, [place] Coordinator Office number [phone] Mobile number [phone]  Main [facility] number [phone] Fax number [phone]

## 2019-01-10 ENCOUNTER — Other Ambulatory Visit: Payer: Self-pay

## 2019-01-10 NOTE — Patient Outreach (Signed)
[facility]) Care Management  01/10/2019  Jaclyn Hart 1950/03/10 [id number]   Medication Adherence call to [name] Compliant Voice message left with a call back number. [name] is showing past due on Benazepril 10 mg under [facility].   [place] Management Direct Dial [phone]  Fax [phone] [name]

## 2019-01-14 ENCOUNTER — Other Ambulatory Visit: Payer: Self-pay | Admitting: *Deleted

## 2019-01-14 NOTE — Patient Outreach (Signed)
[facility]) Care Management  01/14/2019  [name] 03-10-1950 [id number]   Case closure for engaged [facility] patient  Jaclyn Hart referred to [facility] on 11/13/18 as a transition of care patient who became a complex care management patient.  There was difficulty reaching her to engage until 11/19/18 Further engaged calls on 11/26/18 and 12/06/18 Call attempts made on 12/18/18, 12/24/18, 12/31/18 and 01/07/19 without success to re engage Unsuccessful [facility] engaged patient outreach letter sent on 12/31/18 without a response   Plan [facility] RN CM will close case after no response from patient within 10 business days. Unable to reach and maintain contact with her  Case closure letters sent to patient and MD  [name], RN, BSN, [place] Coordinator Office number [phone] Mobile number [phone]  Main [facility] number [phone] Fax number [phone]

## 2019-01-18 DIAGNOSIS — M6281 Muscle weakness (generalized): Secondary | ICD-10-CM | POA: Diagnosis not present

## 2019-01-28 DIAGNOSIS — N3021 Other chronic cystitis with hematuria: Secondary | ICD-10-CM | POA: Diagnosis not present

## 2019-01-28 DIAGNOSIS — N329 Bladder disorder, unspecified: Secondary | ICD-10-CM | POA: Diagnosis not present

## 2019-01-28 DIAGNOSIS — R310 Gross hematuria: Secondary | ICD-10-CM | POA: Diagnosis not present

## 2019-02-17 DIAGNOSIS — M6281 Muscle weakness (generalized): Secondary | ICD-10-CM | POA: Diagnosis not present

## 2019-02-25 DIAGNOSIS — E7849 Other hyperlipidemia: Secondary | ICD-10-CM | POA: Diagnosis not present

## 2019-02-25 DIAGNOSIS — I10 Essential (primary) hypertension: Secondary | ICD-10-CM | POA: Diagnosis not present

## 2019-02-25 DIAGNOSIS — N182 Chronic kidney disease, stage 2 (mild): Secondary | ICD-10-CM | POA: Diagnosis not present

## 2019-03-11 DIAGNOSIS — G43911 Migraine, unspecified, intractable, with status migrainosus: Secondary | ICD-10-CM | POA: Diagnosis not present

## 2019-03-11 DIAGNOSIS — I10 Essential (primary) hypertension: Secondary | ICD-10-CM | POA: Diagnosis not present

## 2019-03-11 DIAGNOSIS — N182 Chronic kidney disease, stage 2 (mild): Secondary | ICD-10-CM | POA: Diagnosis not present

## 2019-03-20 DIAGNOSIS — M6281 Muscle weakness (generalized): Secondary | ICD-10-CM | POA: Diagnosis not present

## 2019-03-27 ENCOUNTER — Other Ambulatory Visit: Payer: Self-pay | Admitting: Cardiology

## 2019-04-15 ENCOUNTER — Encounter: Payer: Self-pay | Admitting: Cardiology

## 2019-04-15 ENCOUNTER — Other Ambulatory Visit: Payer: Self-pay

## 2019-04-15 ENCOUNTER — Encounter: Payer: Self-pay | Admitting: *Deleted

## 2019-04-15 ENCOUNTER — Ambulatory Visit: Payer: Medicare Other | Admitting: Cardiology

## 2019-04-15 VITALS — BP 103/72 | HR 65 | Ht 62.0 in | Wt 152.6 lb

## 2019-04-15 DIAGNOSIS — I10 Essential (primary) hypertension: Secondary | ICD-10-CM

## 2019-04-15 DIAGNOSIS — E782 Mixed hyperlipidemia: Secondary | ICD-10-CM | POA: Diagnosis not present

## 2019-04-15 DIAGNOSIS — I2510 Atherosclerotic heart disease of native coronary artery without angina pectoris: Secondary | ICD-10-CM
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
## 2019-04-15 NOTE — Patient Instructions (Addendum)

## 2019-04-20 DIAGNOSIS — M6281 Muscle weakness (generalized): Secondary | ICD-10-CM | POA: Diagnosis not present

## 2019-04-22 DIAGNOSIS — E7849 Other hyperlipidemia: Secondary | ICD-10-CM | POA: Diagnosis not present

## 2019-04-22 DIAGNOSIS — N182 Chronic kidney disease, stage 2 (mild): Secondary | ICD-10-CM | POA: Diagnosis not present

## 2019-04-22 DIAGNOSIS — I10 Essential (primary) hypertension: Secondary | ICD-10-CM | POA: Diagnosis not present

## 2019-05-06 DIAGNOSIS — R350 Frequency of micturition: Secondary | ICD-10-CM | POA: Diagnosis not present

## 2019-05-20 DIAGNOSIS — N182 Chronic kidney disease, stage 2 (mild): Secondary | ICD-10-CM | POA: Diagnosis not present

## 2019-05-20 DIAGNOSIS — E7849 Other hyperlipidemia: Secondary | ICD-10-CM | POA: Diagnosis not present

## 2019-05-20 DIAGNOSIS — I10 Essential (primary) hypertension: Secondary | ICD-10-CM | POA: Diagnosis not present

## 2019-06-03 DIAGNOSIS — R350 Frequency of micturition: Secondary | ICD-10-CM | POA: Diagnosis not present

## 2019-06-10 DIAGNOSIS — I10 Essential (primary) hypertension: Secondary | ICD-10-CM | POA: Diagnosis not present

## 2019-06-10 DIAGNOSIS — G43911 Migraine, unspecified, intractable, with status migrainosus: Secondary | ICD-10-CM | POA: Diagnosis not present

## 2019-06-10 DIAGNOSIS — Z0000 Encounter for general adult medical examination without abnormal findings: Secondary | ICD-10-CM | POA: Diagnosis not present

## 2019-06-10 DIAGNOSIS — N182 Chronic kidney disease, stage 2 (mild): Secondary | ICD-10-CM | POA: Diagnosis not present

## 2019-06-13 DIAGNOSIS — Z0000 Encounter for general adult medical examination without abnormal findings: Secondary | ICD-10-CM | POA: Diagnosis not present

## 2019-06-13 DIAGNOSIS — N182 Chronic kidney disease, stage 2 (mild): Secondary | ICD-10-CM | POA: Diagnosis not present

## 2019-06-13 DIAGNOSIS — G43911 Migraine, unspecified, intractable, with status migrainosus: Secondary | ICD-10-CM | POA: Diagnosis not present

## 2019-06-13 DIAGNOSIS — I10 Essential (primary) hypertension: Secondary | ICD-10-CM | POA: Diagnosis not present

## 2019-06-24 DIAGNOSIS — N182 Chronic kidney disease, stage 2 (mild): Secondary | ICD-10-CM | POA: Diagnosis not present

## 2019-06-24 DIAGNOSIS — I10 Essential (primary) hypertension: Secondary | ICD-10-CM | POA: Diagnosis not present

## 2019-07-01 ENCOUNTER — Other Ambulatory Visit: Payer: Self-pay | Admitting: Cardiology

## 2019-07-22 DIAGNOSIS — N182 Chronic kidney disease, stage 2 (mild): Secondary | ICD-10-CM | POA: Diagnosis not present

## 2019-07-22 DIAGNOSIS — I10 Essential (primary) hypertension: Secondary | ICD-10-CM | POA: Diagnosis not present

## 2019-08-21 DIAGNOSIS — N182 Chronic kidney disease, stage 2 (mild): Secondary | ICD-10-CM | POA: Diagnosis not present

## 2019-08-21 DIAGNOSIS — I10 Essential (primary) hypertension: Secondary | ICD-10-CM | POA: Diagnosis not present

## 2019-08-29 DIAGNOSIS — N182 Chronic kidney disease, stage 2 (mild): Secondary | ICD-10-CM | POA: Diagnosis not present

## 2019-08-29 DIAGNOSIS — I10 Essential (primary) hypertension: Secondary | ICD-10-CM | POA: Diagnosis not present

## 2019-09-09 DIAGNOSIS — N182 Chronic kidney disease, stage 2 (mild): Secondary | ICD-10-CM | POA: Diagnosis not present

## 2019-09-09 DIAGNOSIS — Z0000 Encounter for general adult medical examination without abnormal findings: Secondary | ICD-10-CM | POA: Diagnosis not present

## 2019-09-09 DIAGNOSIS — G43911 Migraine, unspecified, intractable, with status migrainosus: Secondary | ICD-10-CM | POA: Diagnosis not present

## 2019-09-09 DIAGNOSIS — R350 Frequency of micturition: Secondary | ICD-10-CM | POA: Diagnosis not present

## 2019-09-09 DIAGNOSIS — R3121 Asymptomatic microscopic hematuria: Secondary | ICD-10-CM | POA: Diagnosis not present

## 2019-09-09 DIAGNOSIS — I10 Essential (primary) hypertension: Secondary | ICD-10-CM | POA: Diagnosis not present

## 2019-10-01 DIAGNOSIS — I10 Essential (primary) hypertension: Secondary | ICD-10-CM | POA: Diagnosis not present

## 2019-10-01 DIAGNOSIS — N182 Chronic kidney disease, stage 2 (mild): Secondary | ICD-10-CM | POA: Diagnosis not present

## 2019-11-02 IMAGING — CT CT RENAL STONE PROTOCOL
2 of 4 series · 16 of 46 positions shown, 18 images · non-contrast
Comparison: October 11, 2018

CLINICAL DATA: Hematuria with an unknown cause.

EXAM:
CT ABDOMEN AND PELVIS WITHOUT CONTRAST
TECHNIQUE: Multidetector CT imaging of the abdomen and pelvis was performed
following the standard protocol without IV contrast.

[Series 3: ap without · axial · non-contrast · 0.71mm/px · z∈[+703,+1063]mm · 13 of 80 slices shown, 15 images]
[im 4/80  soft-tissue]
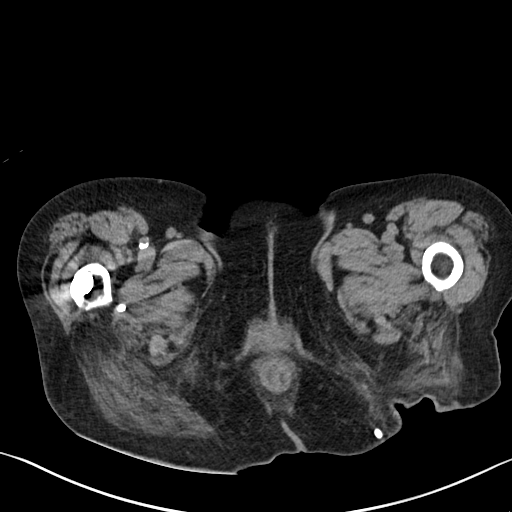
[im 4/80  bone]
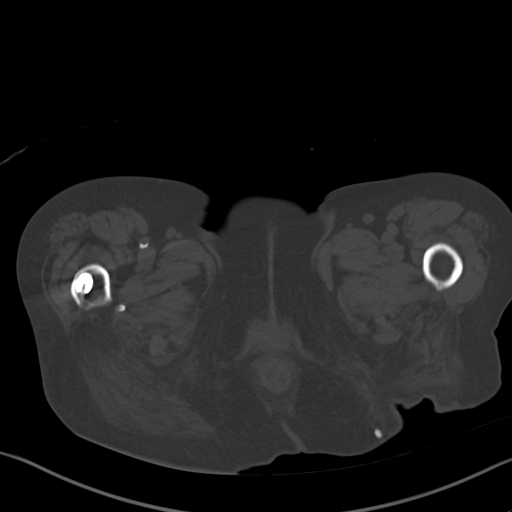
[im 12/80  soft-tissue]
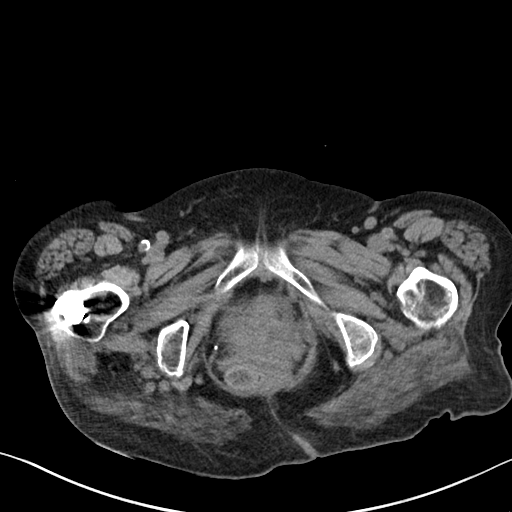
[im 16/80  soft-tissue]
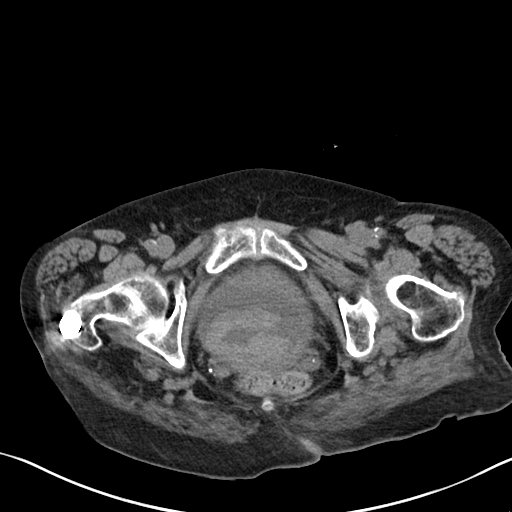
[im 23/80  soft-tissue]
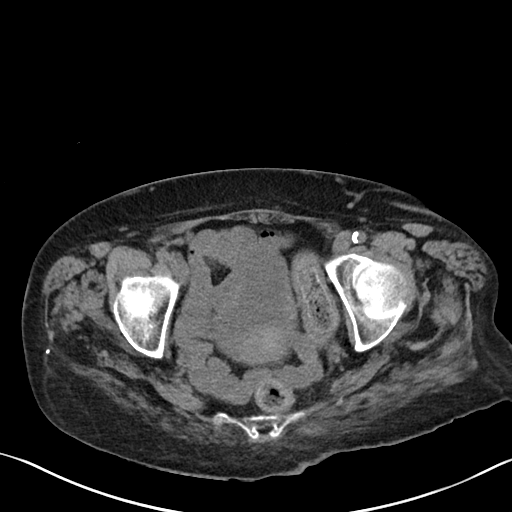
[im 27/80  soft-tissue]
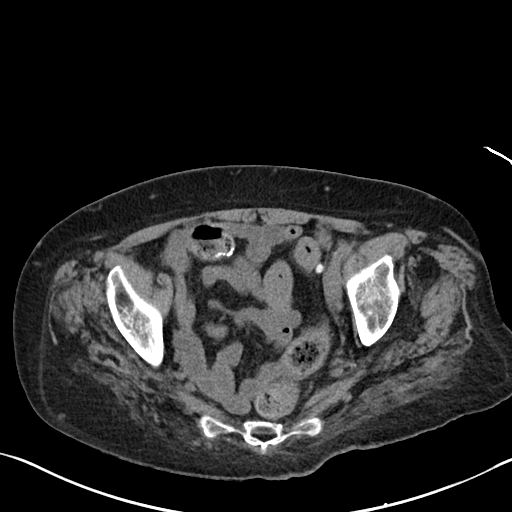
[im 34/80  soft-tissue]
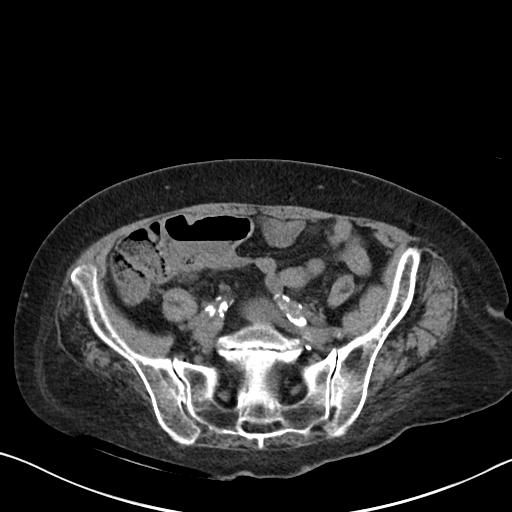
[im 42/80  soft-tissue]
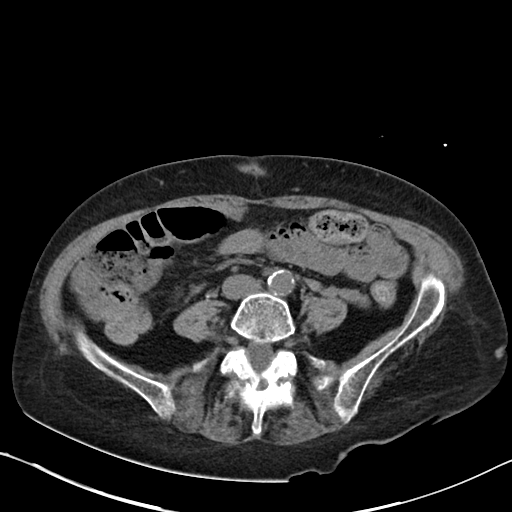
[im 46/80  soft-tissue]
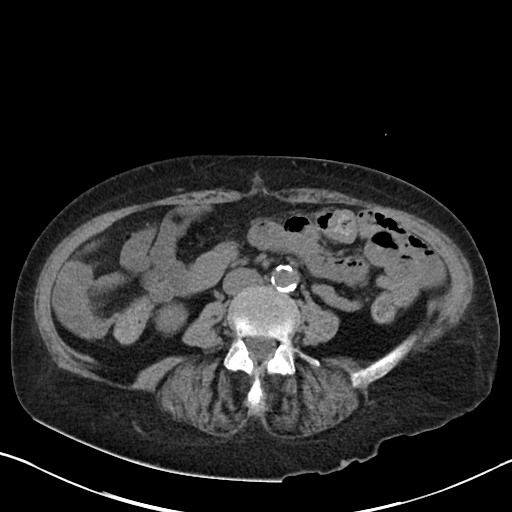
[im 53/80  soft-tissue]
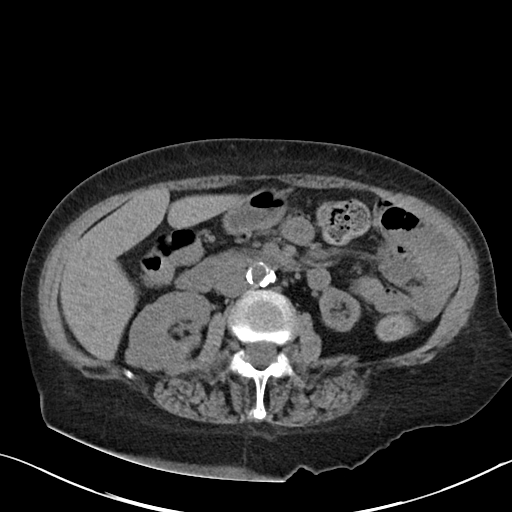
[im 53/80  bone]
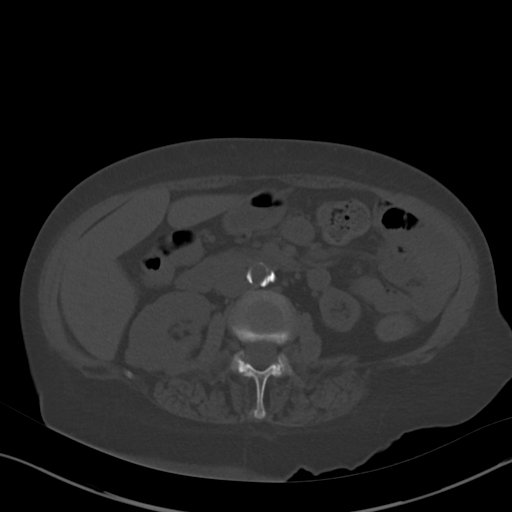
[im 57/80  soft-tissue]
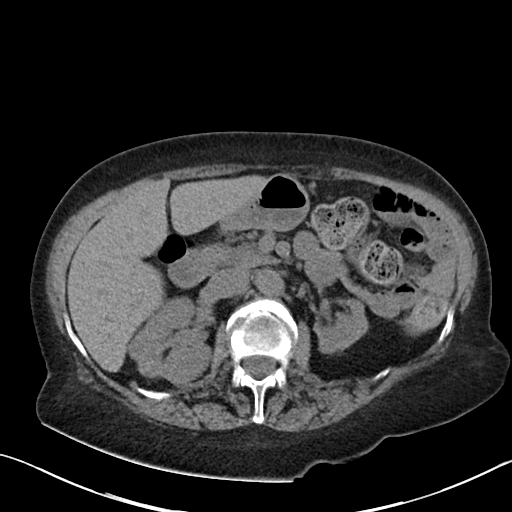
[im 64/80  soft-tissue]
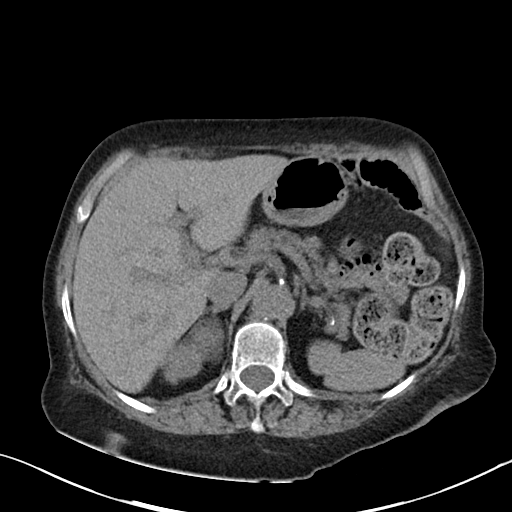
[im 68/80  soft-tissue]
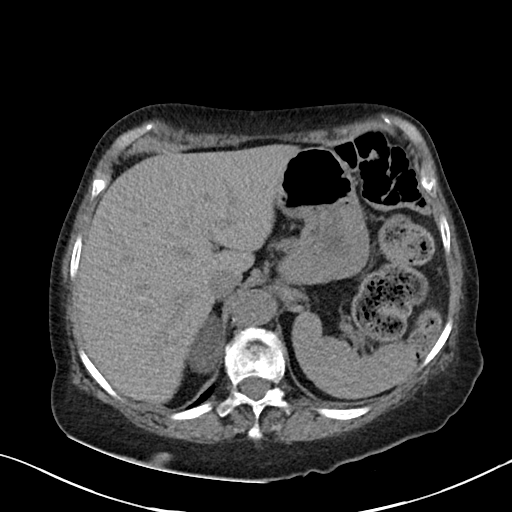
[im 76/80  soft-tissue]
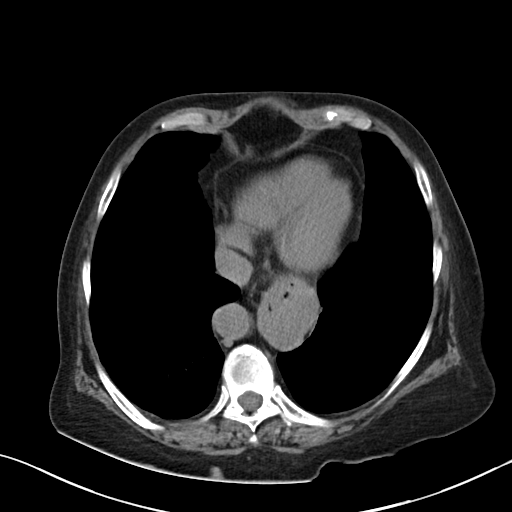

[Series 6: cor · coronal · 0.71mm/px · 3 of 94 slices shown]
[im 32/94  soft-tissue]
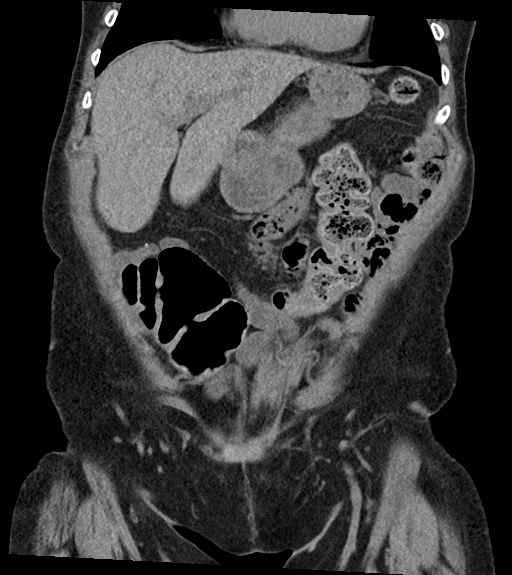
[im 42/94  soft-tissue]
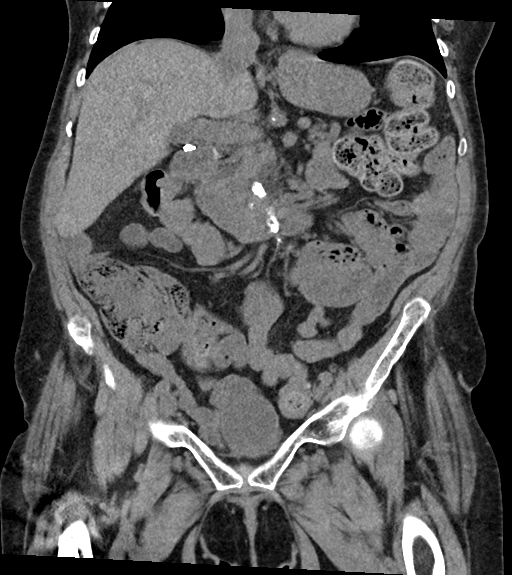
[im 52/94  soft-tissue]
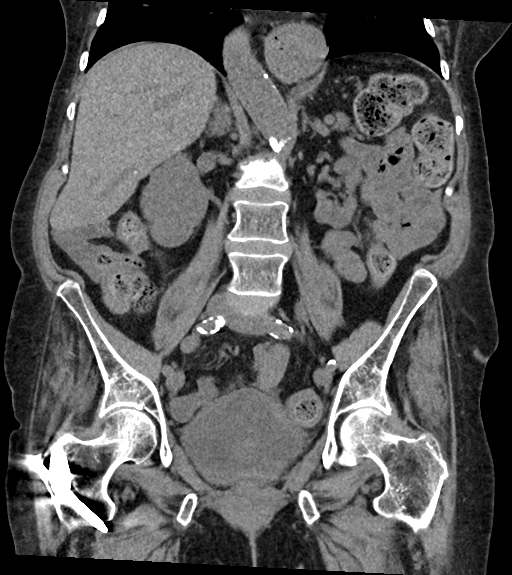

[16 of 46 positions shown; findings below may reference images not displayed]

FINDINGS: Lower chest: The lung bases are clear. The heart size is normal.

Hepatobiliary: The liver is normal. Status post
cholecystectomy.There is no biliary ductal dilation.

Pancreas: Normal contours without ductal dilatation. No
peripancreatic fluid collection.

Spleen: No splenic laceration or hematoma.

Adrenals/Urinary Tract:

--Adrenal glands: Again noted is a right-sided adrenal mass, similar
to prior study.

--Right kidney/ureter: There is a lobular appearance of the right
kidney. There is no hydronephrosis. There is likely cortical
thinning of the upper pole the right kidney. No radiopaque
obstructing stones.

--Left kidney/ureter: The left kidney is atrophic and somewhat
lobular, similar to prior study. There is no left-sided
hydronephrosis.

--Urinary bladder: Again noted is some bladder wall thickening.
There is a hyperdense filling defect within the dependent portion of
the urinary bladder. This filling defect measures approximately
x 4.7 cm.

Stomach/Bowel:

--Stomach/Duodenum: There is a large hiatal hernia.

--Small bowel: No dilatation or inflammation.

--Colon: No focal abnormality.

--Appendix: Surgically absent.

Vascular/Lymphatic: Atherosclerotic calcification is present within
the non-aneurysmal abdominal aorta, without hemodynamically
significant stenosis.

--No retroperitoneal lymphadenopathy.

--No mesenteric lymphadenopathy.

--No pelvic or inguinal lymphadenopathy.

Reproductive: Unremarkable

Other: No ascites or free air. The abdominal wall is normal.

Musculoskeletal. There is a pars defect at L5 resulting in grade 2-3
anterolisthesis of L5 on S1. There is stable vertebral plana of the
L2 vertebral body.
IMPRESSION: 1. Large 7.1 x 4.7 cm filling defect within the dependent portion of
the urinary bladder. This is favored to represent a blood clot given
its short interval development. Again noted is nonspecific bladder
wall thickening.
2. Multiple additional chronic findings as detailed above, not
significantly changed from CT dated 10/11/2018.

## 2019-12-16 DIAGNOSIS — N182 Chronic kidney disease, stage 2 (mild): Secondary | ICD-10-CM | POA: Diagnosis not present

## 2019-12-16 DIAGNOSIS — I10 Essential (primary) hypertension: Secondary | ICD-10-CM | POA: Diagnosis not present

## 2019-12-16 DIAGNOSIS — G43911 Migraine, unspecified, intractable, with status migrainosus: Secondary | ICD-10-CM | POA: Diagnosis not present

## 2019-12-16 DIAGNOSIS — Z0000 Encounter for general adult medical examination without abnormal findings: Secondary | ICD-10-CM | POA: Diagnosis not present

## 2019-12-16 DIAGNOSIS — Z131 Encounter for screening for diabetes mellitus: Secondary | ICD-10-CM | POA: Diagnosis not present

## 2019-12-22 ENCOUNTER — Other Ambulatory Visit: Payer: Self-pay | Admitting: Cardiology

## 2019-12-31 DIAGNOSIS — I10 Essential (primary) hypertension: Secondary | ICD-10-CM | POA: Diagnosis not present

## 2019-12-31 DIAGNOSIS — N182 Chronic kidney disease, stage 2 (mild): Secondary | ICD-10-CM | POA: Diagnosis not present

## 2020-01-06 DIAGNOSIS — B3789 Other sites of candidiasis: Secondary | ICD-10-CM | POA: Diagnosis not present

## 2020-01-13 DIAGNOSIS — M810 Age-related osteoporosis without current pathological fracture: Secondary | ICD-10-CM | POA: Diagnosis not present

## 2020-01-13 DIAGNOSIS — Z1231 Encounter for screening mammogram for malignant neoplasm of breast: Secondary | ICD-10-CM | POA: Diagnosis not present

## 2020-02-20 ENCOUNTER — Other Ambulatory Visit: Payer: Self-pay | Admitting: Cardiology

## 2020-02-24 DIAGNOSIS — M810 Age-related osteoporosis without current pathological fracture: Secondary | ICD-10-CM | POA: Diagnosis not present

## 2020-02-24 DIAGNOSIS — Z13820 Encounter for screening for osteoporosis: Secondary | ICD-10-CM | POA: Diagnosis not present

## 2020-02-24 DIAGNOSIS — Z1231 Encounter for screening mammogram for malignant neoplasm of breast: Secondary | ICD-10-CM | POA: Diagnosis not present

## 2020-03-11 DIAGNOSIS — M810 Age-related osteoporosis without current pathological fracture: Secondary | ICD-10-CM | POA: Diagnosis not present

## 2020-03-23 DIAGNOSIS — Z0000 Encounter for general adult medical examination without abnormal findings: Secondary | ICD-10-CM | POA: Diagnosis not present

## 2020-03-23 DIAGNOSIS — N182 Chronic kidney disease, stage 2 (mild): Secondary | ICD-10-CM | POA: Diagnosis not present

## 2020-03-23 DIAGNOSIS — G43911 Migraine, unspecified, intractable, with status migrainosus: Secondary | ICD-10-CM | POA: Diagnosis not present

## 2020-03-23 DIAGNOSIS — B3789 Other sites of candidiasis: Secondary | ICD-10-CM | POA: Diagnosis not present

## 2020-03-30 ENCOUNTER — Ambulatory Visit: Payer: Medicare Other | Admitting: Cardiology

## 2020-06-20 ENCOUNTER — Other Ambulatory Visit: Payer: Self-pay | Admitting: Cardiology

## 2020-06-29 DIAGNOSIS — L309 Dermatitis, unspecified: Secondary | ICD-10-CM | POA: Diagnosis not present

## 2020-06-29 DIAGNOSIS — L920 Granuloma annulare: Secondary | ICD-10-CM | POA: Diagnosis not present

## 2020-06-29 DIAGNOSIS — N182 Chronic kidney disease, stage 2 (mild): Secondary | ICD-10-CM | POA: Diagnosis not present

## 2020-06-29 DIAGNOSIS — G43911 Migraine, unspecified, intractable, with status migrainosus: Secondary | ICD-10-CM | POA: Diagnosis not present

## 2020-06-29 DIAGNOSIS — Z0000 Encounter for general adult medical examination without abnormal findings: Secondary | ICD-10-CM | POA: Diagnosis not present

## 2020-07-06 ENCOUNTER — Ambulatory Visit: Payer: Medicare Other | Admitting: Cardiology

## 2020-07-06 ENCOUNTER — Encounter: Payer: Self-pay | Admitting: *Deleted

## 2020-07-06 VITALS — BP 140/90 | HR 66 | Ht 62.0 in | Wt 159.4 lb

## 2020-07-06 DIAGNOSIS — R0602 Shortness of breath: Secondary | ICD-10-CM | POA: Diagnosis not present

## 2020-07-06 DIAGNOSIS — I10 Essential (primary) hypertension: Secondary | ICD-10-CM

## 2020-07-06 DIAGNOSIS — R600 Localized edema: Secondary | ICD-10-CM | POA: Diagnosis not present

## 2020-07-06 DIAGNOSIS — E782 Mixed hyperlipidemia: Secondary | ICD-10-CM

## 2020-07-06 DIAGNOSIS — I2510 Atherosclerotic heart disease of native coronary artery without angina pectoris: Secondary | ICD-10-CM

## 2020-07-06 MED ORDER — FUROSEMIDE 20 MG PO TABS: 20.0000 mg | ORAL_TABLET | Freq: Every day | ORAL | 1 refills | Status: AC

## 2020-07-06 MED ORDER — LOSARTAN POTASSIUM 25 MG PO TABS: 12.5000 mg | ORAL_TABLET | Freq: Every day | ORAL | 1 refills | Status: DC
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
## 2020-07-06 NOTE — Patient Instructions (Signed)
Your physician recommends that you schedule a follow-up appointment in: [place] physician has recommended you make the following change in your medication:   START LASIX 20 MG DAILY   START LOSARTAN 12.5 MG (1/2 TABLET) DAILY   Your physician recommends that you return for lab work in: [place] BMP/MG  Your physician has requested that you have an echocardiogram. Echocardiography is a painless test that uses sound waves to create images of your heart. It provides your doctor with information about the size and shape of your heart and how well your heart's chambers and valves are working. This procedure takes approximately one hour. There are no restrictions for this procedure.  Thank you for choosing [facility]!!

## 2020-07-27 DIAGNOSIS — I2510 Atherosclerotic heart disease of native coronary artery without angina pectoris: Secondary | ICD-10-CM | POA: Diagnosis not present

## 2020-08-18 ENCOUNTER — Ambulatory Visit (INDEPENDENT_AMBULATORY_CARE_PROVIDER_SITE_OTHER): Payer: Medicare Other

## 2020-08-18 ENCOUNTER — Other Ambulatory Visit: Payer: Self-pay

## 2020-08-18 DIAGNOSIS — R0602 Shortness of breath: Secondary | ICD-10-CM

## 2020-08-18 LAB — ECHOCARDIOGRAM COMPLETE
AR max vel: 2.1 cm2
AV Area VTI: 2.55 cm2
AV Area mean vel: 1.92 cm2
AV Mean grad: 4.2 mmHg
AV Peak grad: 7.9 mmHg
Ao pk vel: 1.4 m/s
Calc EF: 62.3 %
S' Lateral: 3 cm
Single Plane A2C EF: 62.1 %
Single Plane A4C EF: 63.1 %

## 2020-08-19 ENCOUNTER — Other Ambulatory Visit: Payer: Self-pay | Admitting: Cardiology

## 2020-08-20 ENCOUNTER — Telehealth: Payer: Self-pay

## 2020-08-20 NOTE — Telephone Encounter (Signed)
-----   Message from Arnoldo Lenis, MD sent at 08/09/2020 10:46 AM EDT ----- Labs show she may be a little bit dehydrated, can we clarify how she is taking her prn lasix  Zandra Abts MD

## 2020-08-20 NOTE — Telephone Encounter (Signed)
Spoke to patient who clarified that she was taking 20 mg lasix daily. Explained that in Dr. Nelly Laurence last office note, he stated she should take 20 mg lasix when needed. Pt stated that she was not holding fluid and felt as if she did not need it daily and would only take it when needed. Patient had questions about rehydration and was informed that water/Gatorade/Pedialyte were good choices.

## 2020-09-09 ENCOUNTER — Encounter: Payer: Self-pay | Admitting: *Deleted

## 2020-09-09 NOTE — Telephone Encounter (Signed)
Returning your call for results

## 2020-09-10 ENCOUNTER — Telehealth: Payer: Self-pay | Admitting: *Deleted

## 2020-09-10 NOTE — Telephone Encounter (Signed)
routed conversation to You 26 minutes ago (4:16 PM)   Howie Ill routed conversation to [facility] Triage 1 hour ago (3:24 PM)   Earvin Hansen J 1 hour ago (3:24 PM)   NM  New Message       Patient calling back for her Echo results       Note    Desma Paganini routed conversation to You Yesterday (1:05 PM)   Desma Paganini Yesterday (1:05 PM)   HP  Returning your call for results       Note    You  Vivi, [place] (11:51 AM)    Per Dr. Harl Bowie -   Echo overall looks good. Heart has normal pumping function. There is mild stiffness of the heart muscle that is common with aging but can cause some swelling. Overall her heart looks good   Zandra Abts MD   __________________________________________________________     If you have any questions or concerns, you may message back via my chart or call the office at:  [phone].     Thanks,   Scot Jun.

## 2020-09-10 NOTE — Telephone Encounter (Signed)
Patient notified and verbalized understanding. 

## 2020-09-10 NOTE — Telephone Encounter (Signed)
New Message     Patient calling back for her Echo results

## 2020-10-05 DIAGNOSIS — Z0000 Encounter for general adult medical examination without abnormal findings: Secondary | ICD-10-CM | POA: Diagnosis not present

## 2020-10-05 DIAGNOSIS — N182 Chronic kidney disease, stage 2 (mild): Secondary | ICD-10-CM | POA: Diagnosis not present

## 2020-10-05 DIAGNOSIS — G43911 Migraine, unspecified, intractable, with status migrainosus: Secondary | ICD-10-CM | POA: Diagnosis not present

## 2020-10-05 DIAGNOSIS — L920 Granuloma annulare: Secondary | ICD-10-CM | POA: Diagnosis not present

## 2020-10-05 DIAGNOSIS — G5601 Carpal tunnel syndrome, right upper limb: Secondary | ICD-10-CM | POA: Diagnosis not present

## 2020-10-05 DIAGNOSIS — Z13220 Encounter for screening for lipoid disorders: Secondary | ICD-10-CM | POA: Diagnosis not present

## 2020-10-05 DIAGNOSIS — I10 Essential (primary) hypertension: Secondary | ICD-10-CM | POA: Diagnosis not present
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
## 2020-10-12 ENCOUNTER — Ambulatory Visit: Payer: Medicare Other | Admitting: Family Medicine

## 2020-10-12 ENCOUNTER — Encounter: Payer: Self-pay | Admitting: Family Medicine

## 2020-10-12 VITALS — BP 132/80 | HR 64 | Ht 62.0 in | Wt 158.6 lb

## 2020-10-12 DIAGNOSIS — R600 Localized edema: Secondary | ICD-10-CM

## 2020-10-12 DIAGNOSIS — I10 Essential (primary) hypertension: Secondary | ICD-10-CM | POA: Diagnosis not present

## 2020-10-12 DIAGNOSIS — E782 Mixed hyperlipidemia: Secondary | ICD-10-CM | POA: Diagnosis not present

## 2020-10-12 DIAGNOSIS — R0602 Shortness of breath: Secondary | ICD-10-CM | POA: Diagnosis not present

## 2020-10-12 DIAGNOSIS — I2510 Atherosclerotic heart disease of native coronary artery without angina pectoris: Secondary | ICD-10-CM | POA: Diagnosis not present

## 2020-10-12 NOTE — Patient Instructions (Addendum)
Medication Instructions:  Your physician recommends that you continue on your current medications as directed. Please refer to the Current Medication list given to you today.  Labwork: None  Testing/Procedures: None  Follow-Up: Your physician recommends that you schedule a follow-up appointment in: 1 year You will receive a reminder letter or call in the mail in about 10 months reminding you to call and schedule your appointment. If you don't receive this letter or call, please contact our office.  Any Other Special Instructions Will Be Listed Below (If Applicable).  If you need a refill on your cardiac medications before your next appointment, please call your pharmacy.

## 2020-10-17 ENCOUNTER — Other Ambulatory Visit: Payer: Self-pay | Admitting: Cardiology

## 2020-10-27 DIAGNOSIS — K219 Gastro-esophageal reflux disease without esophagitis: Secondary | ICD-10-CM | POA: Diagnosis not present

## 2020-10-27 DIAGNOSIS — N182 Chronic kidney disease, stage 2 (mild): Secondary | ICD-10-CM | POA: Diagnosis not present

## 2020-10-27 DIAGNOSIS — M818 Other osteoporosis without current pathological fracture: Secondary | ICD-10-CM | POA: Diagnosis not present

## 2020-10-27 DIAGNOSIS — I10 Essential (primary) hypertension: Secondary | ICD-10-CM | POA: Diagnosis not present

## 2020-11-16 ENCOUNTER — Other Ambulatory Visit: Payer: Self-pay | Admitting: Cardiology

## 2020-11-26 DIAGNOSIS — N182 Chronic kidney disease, stage 2 (mild): Secondary | ICD-10-CM | POA: Diagnosis not present

## 2020-11-26 DIAGNOSIS — I10 Essential (primary) hypertension: Secondary | ICD-10-CM | POA: Diagnosis not present

## 2020-12-16 ENCOUNTER — Other Ambulatory Visit: Payer: Self-pay | Admitting: Cardiology

## 2020-12-21 DIAGNOSIS — G43911 Migraine, unspecified, intractable, with status migrainosus: Secondary | ICD-10-CM | POA: Diagnosis not present

## 2020-12-21 DIAGNOSIS — G5601 Carpal tunnel syndrome, right upper limb: Secondary | ICD-10-CM | POA: Diagnosis not present

## 2020-12-21 DIAGNOSIS — N182 Chronic kidney disease, stage 2 (mild): Secondary | ICD-10-CM | POA: Diagnosis not present

## 2020-12-21 DIAGNOSIS — I10 Essential (primary) hypertension: Secondary | ICD-10-CM | POA: Diagnosis not present

## 2020-12-21 DIAGNOSIS — L920 Granuloma annulare: Secondary | ICD-10-CM | POA: Diagnosis not present

## 2020-12-27 DIAGNOSIS — I10 Essential (primary) hypertension: Secondary | ICD-10-CM | POA: Diagnosis not present

## 2020-12-27 DIAGNOSIS — N182 Chronic kidney disease, stage 2 (mild): Secondary | ICD-10-CM | POA: Diagnosis not present

## 2020-12-28 ENCOUNTER — Ambulatory Visit: Payer: Medicare Other | Admitting: Urology

## 2020-12-28 ENCOUNTER — Encounter: Payer: Self-pay | Admitting: Urology

## 2020-12-28 ENCOUNTER — Other Ambulatory Visit: Payer: Self-pay

## 2020-12-28 VITALS — BP 113/79 | HR 71

## 2020-12-28 DIAGNOSIS — R8290 Unspecified abnormal findings in urine: Secondary | ICD-10-CM

## 2020-12-28 DIAGNOSIS — N3941 Urge incontinence: Secondary | ICD-10-CM | POA: Diagnosis not present

## 2020-12-28 DIAGNOSIS — R32 Unspecified urinary incontinence: Secondary | ICD-10-CM | POA: Diagnosis not present

## 2020-12-28 LAB — MICROSCOPIC EXAMINATION
Renal Epithel, UA: NONE SEEN /hpf
WBC, UA: >30 /hpf — AB (ref 0–5)

## 2020-12-28 LAB — URINALYSIS, ROUTINE W REFLEX MICROSCOPIC
Bilirubin, UA: NEGATIVE
Glucose, UA: NEGATIVE
Ketones, UA: NEGATIVE
Nitrite, UA: NEGATIVE
Specific Gravity, UA: 1.015 (ref 1.005–1.030)
Urobilinogen, Ur: 0.2 mg/dL (ref 0.2–1.0)
pH, UA: 5.5 (ref 5.0–7.5)

## 2020-12-28 LAB — BLADDER SCAN AMB NON-IMAGING: Scan Result: 36

## 2020-12-28 MED ORDER — OXYBUTYNIN CHLORIDE ER 10 MG PO TB24: 10.0000 mg | ORAL_TABLET | Freq: Every day | ORAL | 11 refills | Status: DC
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
## 2020-12-28 NOTE — Progress Notes (Signed)
Urological Symptom Review  Patient is experiencing the following symptoms: Frequent urination Get up at night to urinate Leakage of urine PVR 36  Review of Systems  Gastrointestinal (upper)  : Nausea  Gastrointestinal (lower) : Negative for lower GI symptoms  Constitutional : Negative for symptoms  Skin: Negative for skin symptoms  Eyes: Negative for eye symptoms  Ear/Nose/Throat : Negative for Ear/Nose/Throat symptoms  Hematologic/Lymphatic: Negative for Hematologic/Lymphatic symptoms  Cardiovascular : Negative for cardiovascular symptoms  Respiratory : Negative for respiratory symptoms  Endocrine: Negative for endocrine symptoms  Musculoskeletal: Negative for musculoskeletal symptoms  Neurological: Negative for neurological symptoms  Psychologic: Negative for psychiatric symptoms

## 2020-12-31 LAB — URINE CULTURE

## 2021-01-02 MED ORDER — SULFAMETHOXAZOLE-TRIMETHOPRIM 800-160 MG PO TABS: 1.0000 | ORAL_TABLET | Freq: Two times a day (BID) | ORAL | 0 refills | Status: AC

## 2021-01-02 NOTE — Addendum Note (Signed)
Addended by: Primus Bravo on: 01/02/2021 10:24 PM   Modules accepted: Orders

## 2021-01-03 ENCOUNTER — Telehealth: Payer: Self-pay

## 2021-01-03 NOTE — Telephone Encounter (Signed)
-----   Message from Primus Bravo, MD sent at 01/02/2021 10:24 PM EST ----- Please notify patient that her urine culture shows evidence of a UTI.  Rx for Bactrim DS x 5 days sent to pharmacy.

## 2021-01-03 NOTE — Telephone Encounter (Signed)
Daughter called and made aware. 

## 2021-02-08 ENCOUNTER — Other Ambulatory Visit: Payer: Self-pay | Admitting: Urology

## 2021-02-08 ENCOUNTER — Ambulatory Visit: Payer: Medicare Other | Admitting: Urology

## 2021-02-08 ENCOUNTER — Other Ambulatory Visit: Payer: Self-pay

## 2021-02-08 ENCOUNTER — Encounter: Payer: Self-pay | Admitting: Urology

## 2021-02-08 VITALS — BP 92/62 | HR 76

## 2021-02-08 DIAGNOSIS — N3941 Urge incontinence: Secondary | ICD-10-CM

## 2021-02-08 MED ORDER — TOLTERODINE TARTRATE ER 4 MG PO CP24: 4.0000 mg | ORAL_CAPSULE | Freq: Every day | ORAL | 11 refills | Status: DC

## 2021-02-08 MED ORDER — TROSPIUM CHLORIDE ER 60 MG PO CP24: 60.0000 mg | ORAL_CAPSULE | Freq: Every day | ORAL | 11 refills | Status: DC
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
## 2021-02-08 NOTE — Progress Notes (Signed)
Urological Symptom Review  Patient is experiencing the following symptoms: Frequent urination Get up at night to urinate Leakage of urine   Review of Systems  Gastrointestinal (upper)  : Indigestion/heartburn  Gastrointestinal (lower) : Negative for lower GI symptoms  Constitutional : Fatigue  Skin: Negative for skin symptoms  Eyes: Negative for eye symptoms  Ear/Nose/Throat : Sinus problems  Hematologic/Lymphatic: Easy bruising  Cardiovascular : Negative for cardiovascular symptoms  Respiratory : Negative for respiratory symptoms  Endocrine: Negative for endocrine symptoms  Musculoskeletal: Back pain  Neurological: Headaches  Psychologic: Depression

## 2021-02-23 ENCOUNTER — Telehealth: Payer: Self-pay

## 2021-02-23 NOTE — Telephone Encounter (Signed)
PA started on covermymeds for Trospium

## 2021-02-25 DIAGNOSIS — N182 Chronic kidney disease, stage 2 (mild): Secondary | ICD-10-CM | POA: Diagnosis not present

## 2021-02-25 DIAGNOSIS — I10 Essential (primary) hypertension: Secondary | ICD-10-CM | POA: Diagnosis not present

## 2021-03-30 DIAGNOSIS — L920 Granuloma annulare: Secondary | ICD-10-CM | POA: Diagnosis not present

## 2021-03-30 DIAGNOSIS — I10 Essential (primary) hypertension: Secondary | ICD-10-CM | POA: Diagnosis not present

## 2021-03-30 DIAGNOSIS — Z0000 Encounter for general adult medical examination without abnormal findings: Secondary | ICD-10-CM | POA: Diagnosis not present

## 2021-03-30 DIAGNOSIS — G5601 Carpal tunnel syndrome, right upper limb: Secondary | ICD-10-CM | POA: Diagnosis not present

## 2021-03-30 DIAGNOSIS — G43911 Migraine, unspecified, intractable, with status migrainosus: Secondary | ICD-10-CM | POA: Diagnosis not present

## 2021-03-30 DIAGNOSIS — N182 Chronic kidney disease, stage 2 (mild): Secondary | ICD-10-CM | POA: Diagnosis not present

## 2021-04-17 ENCOUNTER — Other Ambulatory Visit: Payer: Self-pay | Admitting: Cardiology

## 2021-06-15 ENCOUNTER — Other Ambulatory Visit: Payer: Self-pay | Admitting: Cardiology

## 2021-06-28 DIAGNOSIS — I10 Essential (primary) hypertension: Secondary | ICD-10-CM | POA: Diagnosis not present

## 2021-06-28 DIAGNOSIS — L920 Granuloma annulare: Secondary | ICD-10-CM | POA: Diagnosis not present

## 2021-06-28 DIAGNOSIS — G5601 Carpal tunnel syndrome, right upper limb: Secondary | ICD-10-CM | POA: Diagnosis not present

## 2021-06-28 DIAGNOSIS — N182 Chronic kidney disease, stage 2 (mild): Secondary | ICD-10-CM | POA: Diagnosis not present

## 2021-06-28 DIAGNOSIS — Z0000 Encounter for general adult medical examination without abnormal findings: Secondary | ICD-10-CM | POA: Diagnosis not present

## 2021-06-28 DIAGNOSIS — K219 Gastro-esophageal reflux disease without esophagitis: Secondary | ICD-10-CM | POA: Diagnosis not present

## 2021-08-26 DIAGNOSIS — K219 Gastro-esophageal reflux disease without esophagitis: Secondary | ICD-10-CM | POA: Diagnosis not present

## 2021-08-26 DIAGNOSIS — N182 Chronic kidney disease, stage 2 (mild): Secondary | ICD-10-CM | POA: Diagnosis not present

## 2021-08-26 DIAGNOSIS — I10 Essential (primary) hypertension: Secondary | ICD-10-CM | POA: Diagnosis not present

## 2021-09-27 DIAGNOSIS — Z0000 Encounter for general adult medical examination without abnormal findings: Secondary | ICD-10-CM | POA: Diagnosis not present

## 2021-09-27 DIAGNOSIS — I10 Essential (primary) hypertension: Secondary | ICD-10-CM | POA: Diagnosis not present

## 2021-09-27 DIAGNOSIS — G5601 Carpal tunnel syndrome, right upper limb: Secondary | ICD-10-CM | POA: Diagnosis not present

## 2021-09-27 DIAGNOSIS — K219 Gastro-esophageal reflux disease without esophagitis: Secondary | ICD-10-CM | POA: Diagnosis not present

## 2021-09-27 DIAGNOSIS — L920 Granuloma annulare: Secondary | ICD-10-CM | POA: Diagnosis not present

## 2021-09-27 DIAGNOSIS — N182 Chronic kidney disease, stage 2 (mild): Secondary | ICD-10-CM | POA: Diagnosis not present

## 2021-10-25 ENCOUNTER — Other Ambulatory Visit: Payer: Self-pay | Admitting: Cardiology

## 2021-12-05 ENCOUNTER — Other Ambulatory Visit: Payer: Self-pay | Admitting: Cardiology

## 2021-12-13 ENCOUNTER — Ambulatory Visit: Payer: Medicare Other | Attending: Cardiology | Admitting: Cardiology

## 2021-12-13 ENCOUNTER — Encounter: Payer: Self-pay | Admitting: Cardiology

## 2021-12-13 VITALS — BP 100/58 | HR 60 | Ht 62.0 in | Wt 158.2 lb

## 2021-12-13 DIAGNOSIS — I2510 Atherosclerotic heart disease of native coronary artery without angina pectoris: Secondary | ICD-10-CM

## 2021-12-13 DIAGNOSIS — R600 Localized edema: Secondary | ICD-10-CM

## 2021-12-13 DIAGNOSIS — I10 Essential (primary) hypertension: Secondary | ICD-10-CM

## 2021-12-13 DIAGNOSIS — E782 Mixed hyperlipidemia: Secondary | ICD-10-CM | POA: Diagnosis not present

## 2021-12-13 NOTE — Patient Instructions (Addendum)
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
## 2022-01-02 ENCOUNTER — Other Ambulatory Visit: Payer: Self-pay | Admitting: Urology

## 2022-01-02 DIAGNOSIS — N3941 Urge incontinence: Secondary | ICD-10-CM

## 2022-01-10 DIAGNOSIS — N182 Chronic kidney disease, stage 2 (mild): Secondary | ICD-10-CM | POA: Diagnosis not present

## 2022-01-10 DIAGNOSIS — J029 Acute pharyngitis, unspecified: Secondary | ICD-10-CM | POA: Diagnosis not present

## 2022-01-10 DIAGNOSIS — K219 Gastro-esophageal reflux disease without esophagitis: Secondary | ICD-10-CM | POA: Diagnosis not present

## 2022-01-10 DIAGNOSIS — G5601 Carpal tunnel syndrome, right upper limb: Secondary | ICD-10-CM | POA: Diagnosis not present

## 2022-01-10 DIAGNOSIS — I10 Essential (primary) hypertension: Secondary | ICD-10-CM | POA: Diagnosis not present

## 2022-01-10 DIAGNOSIS — L920 Granuloma annulare: Secondary | ICD-10-CM | POA: Diagnosis not present

## 2022-02-03 ENCOUNTER — Other Ambulatory Visit: Payer: Self-pay | Admitting: Cardiology

## 2022-02-26 DIAGNOSIS — N182 Chronic kidney disease, stage 2 (mild): Secondary | ICD-10-CM | POA: Diagnosis not present

## 2022-02-26 DIAGNOSIS — I10 Essential (primary) hypertension: Secondary | ICD-10-CM | POA: Diagnosis not present

## 2022-02-26 DIAGNOSIS — K219 Gastro-esophageal reflux disease without esophagitis: Secondary | ICD-10-CM | POA: Diagnosis not present

## 2022-03-02 ENCOUNTER — Other Ambulatory Visit: Payer: Self-pay | Admitting: Cardiology

## 2022-04-18 DIAGNOSIS — K219 Gastro-esophageal reflux disease without esophagitis: Secondary | ICD-10-CM | POA: Diagnosis not present

## 2022-04-18 DIAGNOSIS — J029 Acute pharyngitis, unspecified: Secondary | ICD-10-CM | POA: Diagnosis not present

## 2022-04-18 DIAGNOSIS — I10 Essential (primary) hypertension: Secondary | ICD-10-CM | POA: Diagnosis not present

## 2022-04-18 DIAGNOSIS — L920 Granuloma annulare: Secondary | ICD-10-CM | POA: Diagnosis not present

## 2022-04-18 DIAGNOSIS — N1831 Chronic kidney disease, stage 3a: Secondary | ICD-10-CM | POA: Diagnosis not present

## 2022-04-18 DIAGNOSIS — G5601 Carpal tunnel syndrome, right upper limb: Secondary | ICD-10-CM | POA: Diagnosis not present

## 2022-05-01 ENCOUNTER — Other Ambulatory Visit: Payer: Self-pay | Admitting: Cardiology

## 2022-05-28 DIAGNOSIS — G5601 Carpal tunnel syndrome, right upper limb: Secondary | ICD-10-CM | POA: Diagnosis not present

## 2022-05-28 DIAGNOSIS — N1831 Chronic kidney disease, stage 3a: Secondary | ICD-10-CM | POA: Diagnosis not present

## 2022-05-28 DIAGNOSIS — I10 Essential (primary) hypertension: Secondary | ICD-10-CM | POA: Diagnosis not present

## 2022-05-28 DIAGNOSIS — K219 Gastro-esophageal reflux disease without esophagitis: Secondary | ICD-10-CM | POA: Diagnosis not present

## 2022-05-30 ENCOUNTER — Ambulatory Visit: Payer: Medicare Other | Admitting: Cardiology

## 2022-07-21 DIAGNOSIS — I10 Essential (primary) hypertension: Secondary | ICD-10-CM | POA: Diagnosis not present

## 2022-07-21 DIAGNOSIS — G5601 Carpal tunnel syndrome, right upper limb: Secondary | ICD-10-CM | POA: Diagnosis not present

## 2022-07-21 DIAGNOSIS — K219 Gastro-esophageal reflux disease without esophagitis: Secondary | ICD-10-CM | POA: Diagnosis not present

## 2022-07-21 DIAGNOSIS — N1831 Chronic kidney disease, stage 3a: Secondary | ICD-10-CM | POA: Diagnosis not present

## 2022-08-22 ENCOUNTER — Ambulatory Visit: Payer: Medicare Other | Attending: Cardiology | Admitting: Cardiology

## 2022-08-22 ENCOUNTER — Encounter: Payer: Self-pay | Admitting: Cardiology

## 2022-08-22 VITALS — BP 98/63 | HR 65 | Ht 64.0 in | Wt 155.4 lb

## 2022-08-22 DIAGNOSIS — I10 Essential (primary) hypertension: Secondary | ICD-10-CM

## 2022-08-22 DIAGNOSIS — E782 Mixed hyperlipidemia: Secondary | ICD-10-CM

## 2022-08-22 DIAGNOSIS — I2510 Atherosclerotic heart disease of native coronary artery without angina pectoris: Secondary | ICD-10-CM

## 2022-08-22 DIAGNOSIS — R600 Localized edema: Secondary | ICD-10-CM | POA: Diagnosis not present

## 2022-08-22 MED ORDER — METOPROLOL TARTRATE 25 MG PO TABS: 25.0000 mg | ORAL_TABLET | Freq: Two times a day (BID) | ORAL | 6 refills | Status: DC
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
## 2022-08-22 NOTE — Patient Instructions (Signed)
Medication Instructions:  Decrease Lopressor to 25mg twice a day   Continue all other medications.     Labwork: none  Testing/Procedures: none  Follow-Up: 6 months   Any Other Special Instructions Will Be Listed Below (If Applicable).   If you need a refill on your cardiac medications before your next appointment, please call your pharmacy.  

## 2022-10-17 DIAGNOSIS — L920 Granuloma annulare: Secondary | ICD-10-CM | POA: Diagnosis not present

## 2022-10-17 DIAGNOSIS — L309 Dermatitis, unspecified: Secondary | ICD-10-CM | POA: Diagnosis not present

## 2022-10-17 DIAGNOSIS — I10 Essential (primary) hypertension: Secondary | ICD-10-CM | POA: Diagnosis not present

## 2022-10-17 DIAGNOSIS — K219 Gastro-esophageal reflux disease without esophagitis: Secondary | ICD-10-CM | POA: Diagnosis not present

## 2022-10-17 DIAGNOSIS — J040 Acute laryngitis: Secondary | ICD-10-CM | POA: Diagnosis not present

## 2022-10-17 DIAGNOSIS — G5601 Carpal tunnel syndrome, right upper limb: Secondary | ICD-10-CM | POA: Diagnosis not present

## 2022-10-17 DIAGNOSIS — N1831 Chronic kidney disease, stage 3a: Secondary | ICD-10-CM | POA: Diagnosis not present

## 2022-10-26 ENCOUNTER — Other Ambulatory Visit: Payer: Self-pay | Admitting: Cardiology

## 2022-10-27 ENCOUNTER — Other Ambulatory Visit: Payer: Self-pay | Admitting: Cardiology

## 2022-11-26 ENCOUNTER — Other Ambulatory Visit: Payer: Self-pay | Admitting: Cardiology

## 2023-01-16 ENCOUNTER — Encounter: Payer: Self-pay | Admitting: Diagnostic Neuroimaging

## 2023-01-16 ENCOUNTER — Ambulatory Visit: Payer: Medicare Other | Admitting: Diagnostic Neuroimaging

## 2023-01-16 VITALS — BP 130/71 | HR 74 | Ht 61.0 in | Wt 165.0 lb

## 2023-01-16 DIAGNOSIS — R202 Paresthesia of skin: Secondary | ICD-10-CM

## 2023-01-16 DIAGNOSIS — R200 Anesthesia of skin: Secondary | ICD-10-CM | POA: Diagnosis not present

## 2023-01-16 DIAGNOSIS — G5601 Carpal tunnel syndrome, right upper limb: Secondary | ICD-10-CM | POA: Diagnosis not present

## 2023-01-16 DIAGNOSIS — N1831 Chronic kidney disease, stage 3a: Secondary | ICD-10-CM | POA: Diagnosis not present

## 2023-01-16 DIAGNOSIS — Z0000 Encounter for general adult medical examination without abnormal findings: Secondary | ICD-10-CM | POA: Diagnosis not present

## 2023-01-16 DIAGNOSIS — L920 Granuloma annulare: Secondary | ICD-10-CM | POA: Diagnosis not present

## 2023-01-16 DIAGNOSIS — I10 Essential (primary) hypertension: Secondary | ICD-10-CM | POA: Diagnosis not present

## 2023-01-16 DIAGNOSIS — K219 Gastro-esophageal reflux disease without esophagitis: Secondary | ICD-10-CM | POA: Diagnosis not present

## 2023-01-16 DIAGNOSIS — J440 Chronic obstructive pulmonary disease with acute lower respiratory infection: Secondary | ICD-10-CM | POA: Diagnosis not present

## 2023-01-16 NOTE — Patient Instructions (Signed)
  Right hand numbness - check EMG/NCS (nerve testing) - wear wrist splint at bedtime
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
## 2023-01-29 ENCOUNTER — Telehealth: Payer: Self-pay | Admitting: Cardiology

## 2023-01-29 NOTE — Telephone Encounter (Signed)
Spoke with patient daughter and advised her patient is only supposed to be taking 25 Mg BID per Dr.Branch not 50 Mg BID that was sent By patient PCP this was submitted in error

## 2023-01-29 NOTE — Telephone Encounter (Signed)
Received fax from [facility] stating patient daughter wanted to clarify patient dose of metoprolol. On 01/16/23 Dr.Hasanji called in Metoprolol 50 Mg BID and patient was unaware of him changing this medications. Patient and daughter trying to figure out why medication was changed or if it was an accident. Will contact Dr.Hasanaji office for clarification

## 2023-01-29 NOTE — Telephone Encounter (Signed)
Spoke with Toma Deiters, MD office  They stated that they have always had patient taking 50 mg BID due to patient telling them this was done at our office.  Advised his office we have patient taking 25 Mg BID for future reference

## 2023-02-05 ENCOUNTER — Telehealth: Payer: Self-pay | Admitting: Diagnostic Neuroimaging

## 2023-02-05 NOTE — Telephone Encounter (Signed)
Pt's daughter has called, needing to reschedule pt's NCV/EMG due to a schedule conflict.

## 2023-02-25 ENCOUNTER — Other Ambulatory Visit: Payer: Self-pay | Admitting: Cardiology

## 2023-03-01 ENCOUNTER — Encounter: Payer: Medicare Other | Admitting: Diagnostic Neuroimaging

## 2023-03-15 ENCOUNTER — Telehealth: Payer: Self-pay | Admitting: Diagnostic Neuroimaging

## 2023-03-15 ENCOUNTER — Encounter: Payer: Medicare Other | Admitting: Diagnostic Neuroimaging

## 2023-03-15 NOTE — Telephone Encounter (Signed)
Pt 's daughter called to cancel appt due to patient do not feel good.

## 2023-04-05 ENCOUNTER — Ambulatory Visit: Payer: Self-pay | Admitting: Diagnostic Neuroimaging

## 2023-04-05 ENCOUNTER — Ambulatory Visit: Payer: Medicare Other | Admitting: Diagnostic Neuroimaging

## 2023-04-05 DIAGNOSIS — G5601 Carpal tunnel syndrome, right upper limb: Secondary | ICD-10-CM

## 2023-04-05 DIAGNOSIS — R200 Anesthesia of skin: Secondary | ICD-10-CM

## 2023-04-05 DIAGNOSIS — Z0289 Encounter for other administrative examinations: Secondary | ICD-10-CM

## 2023-04-05 NOTE — Progress Notes (Signed)
 Emg/ncs shows mild-moderate right carpal tunnel syndrome. Will refer to hand surgery clinic.   Orders Placed This Encounter  Procedures   Ambulatory referral to Hand Surgery   EDUARD FABIENE HANLON, MD 04/05/2023, 4:30 PM Certified in Neurology, Neurophysiology and Neuroimaging  [facility] [address] [place], [place] [address] [phone]

## 2023-04-05 NOTE — Procedures (Signed)
 [facility]  NCS (NERVE CONDUCTION STUDY) WITH EMG (ELECTROMYOGRAPHY) REPORT   STUDY DATE: 04/05/23 PATIENT NAME: Jaclyn Hart DOB: 02-Jan-1951 MRN: [id number]  ORDERING CLINICIAN: Eduard Hanlon, MD   TECHNOLOGIST: CHRISTELLA Ahle ELECTROMYOGRAPHER: Eduard SAUNDERS. Libia Fazzini, MD  CLINICAL INFORMATION: 73 year old female with right hand numbness.  FINDINGS: NERVE CONDUCTION STUDY:  Right median motor response has normal distal latency, decreased amplitude and normal conduction velocity.  Left median and bilateral ulnar motor responses are normal.  Right median sensory response could not be obtained.  Bilateral ulnar and left median sensory responses are normal.  Bilateral ulnar F-wave latencies are normal.    NEEDLE ELECTROMYOGRAPHY:  Needle examination of right upper extremity is normal.   IMPRESSION:   Abnormal study demonstrating: -Mild to moderate right median neuropathy at the wrist consistent with mild to moderate right carpal tunnel syndrome.   INTERPRETING PHYSICIAN:  EDUARD FABIENE HANLON, MD Certified in Neurology, Neurophysiology and Neuroimaging  [facility] [address] [place], [place] [address] [phone]  [facility]    Nerve / Sites Muscle Latency Ref. Amplitude Ref. Rel Amp Segments Distance Velocity Ref. Area    ms ms mV mV %  cm m/s m/s mVms  R Median - APB     Wrist APB 3.8 <=4.4 0.5 >=4.0 100 Wrist - APB 7   1.0     Upper arm APB 8.3  0.8  169 Upper arm - Wrist 23 51 >=49 4.8  L Median - APB     Wrist APB 3.5 <=4.4 4.6 >=4.0 100 Wrist - APB 7   17.5     Upper arm APB 7.8  4.8  105 Upper arm - Wrist 24 56 >=49 17.9  R Ulnar - ADM     Wrist ADM 2.8 <=3.3 6.6 >=6.0 100 Wrist - ADM 7   22.8     B.Elbow ADM 4.3  6.2  94.6 B.Elbow - Wrist 9.6 62 >=49 21.6     A.Elbow ADM 7.8  5.5  88.9 A.Elbow - B.Elbow 18 52 >=49 20.6  L Ulnar - ADM     Wrist ADM 2.5 <=3.3 6.3 >=6.0 100 Wrist - ADM 7   21.1     B.Elbow  ADM 3.9  7.0  111 B.Elbow - Wrist 9 66 >=49 24.1     A.Elbow ADM 7.2  6.3  90.7 A.Elbow - B.Elbow 22 66 >=49 21.6             SNC    Nerve / Sites Rec. Site Peak Lat Ref.  Amp Ref. Segments Distance    ms ms V V  cm  R Median - Orthodromic (Dig II, Mid palm)     Dig II Wrist NR <=3.4 NR >=10 Dig II - Wrist 13  L Median - Orthodromic (Dig II, Mid palm)     Dig II Wrist 3.1 <=3.4 13 >=10 Dig II - Wrist 13  R Ulnar - Orthodromic, (Dig V, Mid palm)     Dig V Wrist 2.8 <=3.1 11 >=5 Dig V - Wrist 11  L Ulnar - Orthodromic, (Dig V, Mid palm)     Dig V Wrist 2.7 <=3.1 7 >=5 Dig V - Wrist 34             F  Wave    Nerve F Lat Ref.   ms ms  R Ulnar - ADM 27.1 <=32.0  L Ulnar - ADM 26.5 <=32.0  EMG Summary Table    Spontaneous MUAP Recruitment  Muscle IA Fib PSW Fasc Other Amp Dur. Poly Pattern  R. Deltoid Normal None None None _______ Normal Normal Normal Normal  R. Biceps brachii Normal None None None _______ Normal Normal Normal Normal  R. Triceps brachii Normal None None None _______ Normal Normal Normal Normal  R. Flexor carpi radialis Normal None None None _______ Normal Normal Normal Normal  R. First dorsal interosseous Normal None None None _______ Normal Normal Normal Normal

## 2023-04-09 ENCOUNTER — Telehealth: Payer: Self-pay | Admitting: Diagnostic Neuroimaging

## 2023-04-09 NOTE — Telephone Encounter (Signed)
Referral for hand surgery fax to [place]. Phone: [phone], Fax: [phone]

## 2023-04-25 ENCOUNTER — Other Ambulatory Visit: Payer: Self-pay | Admitting: Cardiology

## 2023-04-30 ENCOUNTER — Encounter: Payer: Self-pay | Admitting: *Deleted

## 2023-05-01 ENCOUNTER — Ambulatory Visit: Payer: Medicare Other | Admitting: Cardiology

## 2023-05-01 DIAGNOSIS — K219 Gastro-esophageal reflux disease without esophagitis: Secondary | ICD-10-CM | POA: Diagnosis not present

## 2023-05-01 DIAGNOSIS — I10 Essential (primary) hypertension: Secondary | ICD-10-CM | POA: Diagnosis not present

## 2023-05-01 DIAGNOSIS — Z0000 Encounter for general adult medical examination without abnormal findings: Secondary | ICD-10-CM | POA: Diagnosis not present

## 2023-05-01 DIAGNOSIS — J440 Chronic obstructive pulmonary disease with acute lower respiratory infection: Secondary | ICD-10-CM | POA: Diagnosis not present

## 2023-05-01 DIAGNOSIS — L920 Granuloma annulare: Secondary | ICD-10-CM | POA: Diagnosis not present

## 2023-05-01 DIAGNOSIS — G5601 Carpal tunnel syndrome, right upper limb: Secondary | ICD-10-CM | POA: Diagnosis not present

## 2023-05-01 DIAGNOSIS — N1831 Chronic kidney disease, stage 3a: Secondary | ICD-10-CM | POA: Diagnosis not present

## 2023-06-11 ENCOUNTER — Other Ambulatory Visit: Payer: Self-pay | Admitting: Cardiology

## 2023-06-24 ENCOUNTER — Other Ambulatory Visit: Payer: Self-pay | Admitting: Cardiology

## 2023-07-03 ENCOUNTER — Ambulatory Visit: Admitting: Cardiology
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
## 2023-07-23 ENCOUNTER — Other Ambulatory Visit: Payer: Self-pay | Admitting: Cardiology

## 2023-08-06 DIAGNOSIS — I10 Essential (primary) hypertension: Secondary | ICD-10-CM | POA: Diagnosis not present

## 2023-08-06 DIAGNOSIS — N1831 Chronic kidney disease, stage 3a: Secondary | ICD-10-CM | POA: Diagnosis not present

## 2023-08-06 DIAGNOSIS — J440 Chronic obstructive pulmonary disease with acute lower respiratory infection: Secondary | ICD-10-CM | POA: Diagnosis not present

## 2023-08-06 DIAGNOSIS — Z0000 Encounter for general adult medical examination without abnormal findings: Secondary | ICD-10-CM | POA: Diagnosis not present

## 2023-08-06 DIAGNOSIS — L920 Granuloma annulare: Secondary | ICD-10-CM | POA: Diagnosis not present

## 2023-08-06 DIAGNOSIS — G5601 Carpal tunnel syndrome, right upper limb: Secondary | ICD-10-CM | POA: Diagnosis not present

## 2023-08-06 DIAGNOSIS — K219 Gastro-esophageal reflux disease without esophagitis: Secondary | ICD-10-CM | POA: Diagnosis not present

## 2023-09-25 ENCOUNTER — Ambulatory Visit: Admitting: Cardiology
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
## 2023-10-23 ENCOUNTER — Other Ambulatory Visit: Payer: Self-pay | Admitting: Cardiology

## 2023-11-19 DIAGNOSIS — J440 Chronic obstructive pulmonary disease with acute lower respiratory infection: Secondary | ICD-10-CM | POA: Diagnosis not present

## 2023-11-19 DIAGNOSIS — K219 Gastro-esophageal reflux disease without esophagitis: Secondary | ICD-10-CM | POA: Diagnosis not present

## 2023-11-19 DIAGNOSIS — N1832 Chronic kidney disease, stage 3b: Secondary | ICD-10-CM | POA: Diagnosis not present

## 2023-11-19 DIAGNOSIS — I10 Essential (primary) hypertension: Secondary | ICD-10-CM | POA: Diagnosis not present

## 2023-11-19 DIAGNOSIS — G5601 Carpal tunnel syndrome, right upper limb: Secondary | ICD-10-CM | POA: Diagnosis not present

## 2023-11-30 DIAGNOSIS — I10 Essential (primary) hypertension: Secondary | ICD-10-CM | POA: Diagnosis not present

## 2023-11-30 DIAGNOSIS — N1832 Chronic kidney disease, stage 3b: Secondary | ICD-10-CM | POA: Diagnosis not present

## 2024-01-08 ENCOUNTER — Ambulatory Visit: Admitting: Cardiology
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
## 2024-02-18 ENCOUNTER — Other Ambulatory Visit: Payer: Self-pay | Admitting: Cardiology

## 2024-04-01 ENCOUNTER — Ambulatory Visit: Admitting: Cardiology

## 2024-06-24 ENCOUNTER — Ambulatory Visit: Admitting: Cardiology
